# Patient Record
Sex: Female | Born: 1955 | Race: White | Hispanic: Refuse to answer | Marital: Married | Smoking: Former smoker
Health system: Southern US, Community
[De-identification: ages and names within clinical notes are randomized; demographics above are authoritative.]

## PROBLEM LIST (undated history)

## (undated) DIAGNOSIS — C801 Malignant (primary) neoplasm, unspecified: Secondary | ICD-10-CM

## (undated) DIAGNOSIS — I10 Essential (primary) hypertension: Secondary | ICD-10-CM

## (undated) DIAGNOSIS — H8120 Vestibular neuronitis, unspecified ear: Secondary | ICD-10-CM

## (undated) DIAGNOSIS — R42 Dizziness and giddiness: Secondary | ICD-10-CM

## (undated) HISTORY — PX: OTHER SURGICAL HISTORY: SHX169

## (undated) HISTORY — DX: Essential (primary) hypertension: I10

## (undated) HISTORY — DX: Dizziness and giddiness: R42

## (undated) HISTORY — DX: Vestibular neuronitis, unspecified ear: H81.20

## (undated) HISTORY — PX: OOPHORECTOMY: SHX86

## (undated) HISTORY — DX: Malignant (primary) neoplasm, unspecified: C80.1

---

## 1998-03-20 ENCOUNTER — Other Ambulatory Visit: Admission: RE | Admit: 1998-03-20 | Discharge: 1998-03-20 | Payer: Self-pay | Admitting: Obstetrics and Gynecology

## 1999-03-30 ENCOUNTER — Other Ambulatory Visit: Admission: RE | Admit: 1999-03-30 | Discharge: 1999-03-30 | Payer: Self-pay | Admitting: Family Medicine

## 1999-06-10 ENCOUNTER — Encounter: Payer: Self-pay | Admitting: Family Medicine

## 1999-06-10 ENCOUNTER — Encounter: Admission: RE | Admit: 1999-06-10 | Discharge: 1999-06-10 | Payer: Self-pay | Admitting: Family Medicine

## 2000-05-18 ENCOUNTER — Other Ambulatory Visit: Admission: RE | Admit: 2000-05-18 | Discharge: 2000-05-18 | Payer: Self-pay | Admitting: Obstetrics and Gynecology

## 2001-05-31 ENCOUNTER — Other Ambulatory Visit: Admission: RE | Admit: 2001-05-31 | Discharge: 2001-05-31 | Payer: Self-pay | Admitting: Obstetrics and Gynecology

## 2002-03-20 ENCOUNTER — Encounter: Admission: RE | Admit: 2002-03-20 | Discharge: 2002-03-20 | Payer: Self-pay | Admitting: Family Medicine

## 2002-03-20 ENCOUNTER — Encounter: Payer: Self-pay | Admitting: Family Medicine

## 2002-06-20 ENCOUNTER — Other Ambulatory Visit: Admission: RE | Admit: 2002-06-20 | Discharge: 2002-06-20 | Payer: Self-pay | Admitting: Obstetrics and Gynecology

## 2003-05-25 ENCOUNTER — Other Ambulatory Visit: Admission: RE | Admit: 2003-05-25 | Discharge: 2003-05-25 | Payer: Self-pay | Admitting: Obstetrics and Gynecology

## 2004-06-24 ENCOUNTER — Other Ambulatory Visit: Admission: RE | Admit: 2004-06-24 | Discharge: 2004-06-24 | Payer: Self-pay | Admitting: Obstetrics and Gynecology

## 2012-08-15 ENCOUNTER — Encounter: Payer: Self-pay | Admitting: Internal Medicine

## 2012-08-24 ENCOUNTER — Ambulatory Visit (AMBULATORY_SURGERY_CENTER): Payer: BC Managed Care – PPO | Admitting: *Deleted

## 2012-08-24 VITALS — Ht 67.0 in | Wt 182.0 lb

## 2012-08-24 DIAGNOSIS — Z1211 Encounter for screening for malignant neoplasm of colon: Secondary | ICD-10-CM

## 2012-08-24 MED ORDER — MOVIPREP 100 G PO SOLR: ORAL | Status: DC

## 2012-08-24 NOTE — Progress Notes (Signed)
Patient denies any allergies to eggs or soy. 

## 2012-08-27 ENCOUNTER — Encounter: Payer: Self-pay | Admitting: Internal Medicine

## 2012-08-29 ENCOUNTER — Encounter: Payer: Self-pay | Admitting: Internal Medicine

## 2012-08-29 ENCOUNTER — Ambulatory Visit (AMBULATORY_SURGERY_CENTER): Payer: BC Managed Care – PPO | Admitting: Internal Medicine

## 2012-08-29 VITALS — BP 160/92 | HR 64 | Temp 98.6°F | Resp 49 | Ht 67.0 in | Wt 182.0 lb

## 2012-08-29 DIAGNOSIS — Z1211 Encounter for screening for malignant neoplasm of colon: Secondary | ICD-10-CM

## 2012-08-29 DIAGNOSIS — D126 Benign neoplasm of colon, unspecified: Secondary | ICD-10-CM

## 2012-08-29 MED ORDER — SODIUM CHLORIDE 0.9 % IV SOLN: 500.0000 mL | INTRAVENOUS | Status: DC

## 2012-08-29 NOTE — Progress Notes (Signed)
Called to room to assist during endoscopic procedure.  Patient ID and intended procedure confirmed with present staff. Received instructions for my participation in the procedure from the performing physician.  

## 2012-08-29 NOTE — Op Note (Signed)
[facility] [address] [place], [address]   COLONOSCOPY PROCEDURE REPORT  PATIENT: Erika Mitchell, Erika Mitchell  MR#: [id number] BIRTHDATE: 1955/03/17 , 56  yrs. old GENDER: Female ENDOSCOPIST: Beverley Fiedler, MD REFERRED BY:W.  Buren Kos, M.D. PROCEDURE DATE:  08/29/2012 PROCEDURE:   Colonoscopy with cold biopsy polypectomy and Colonoscopy with snare polypectomy ASA CLASS:   Class I INDICATIONS:average risk screening and first colonoscopy. MEDICATIONS: MAC sedation, administered by CRNA and propofol (Diprivan) 400mg  IV  DESCRIPTION OF PROCEDURE:   After the risks benefits and alternatives of the procedure were thoroughly explained, informed consent was obtained.  A digital rectal exam revealed no rectal mass.   The LB [id number]  endoscope was introduced through the anus and advanced to the cecum, which was identified by both the appendix and ileocecal valve. No adverse events experienced. The quality of the prep was good, using MoviPrep  The instrument was then slowly withdrawn as the colon was fully examined.   COLON FINDINGS: A sessile polyp measuring 2 mm in size was found in the ascending colon.  A polypectomy was performed with cold forceps.  The resection was complete and the polyp tissue was completely retrieved.   A sessile polyp measuring 6 mm in size was found in the transverse colon.  A polypectomy was performed with a cold snare.  The resection was complete and the polyp tissue was completely retrieved.   There was mild scattered diverticulosis noted in the sigmoid colon.  Retroflexed views revealed internal hemorrhoids. The time to cecum=5 minutes 00 seconds.  Withdrawal time=15 minutes 00 seconds.  The scope was withdrawn and the procedure completed. COMPLICATIONS: There were no complications.  ENDOSCOPIC IMPRESSION: 1.   Sessile polyp measuring 2 mm in size was found in the ascending colon; polypectomy was performed with cold forceps 2.    Sessile polyp measuring 6 mm in size was found in the transverse colon; polypectomy was performed with a cold snare 3.   There was mild diverticulosis noted in the sigmoid colon  RECOMMENDATIONS: 1.  Hold aspirin, aspirin products, and anti-inflammatory medication for 1 week. 2.  Await pathology results 3.  High fiber diet 4.  If the polyps removed today are proven to be adenomatous (pre-cancerous) polyps, you will need a repeat colonoscopy in 5 years.  Otherwise you should continue to follow colorectal cancer screening guidelines for "routine risk" patients with colonoscopy in 10 years.  You will receive a letter within 1-2 weeks with the results of your biopsy as well as final recommendations.  Please call my office if you have not received a letter after 3 weeks.  eSigned:  Beverley Fiedler, MD 08/29/2012 10:39 AM     cc: The Patient and W.  Buren Kos, MD

## 2012-08-29 NOTE — Patient Instructions (Addendum)
YOU HAD AN ENDOSCOPIC PROCEDURE TODAY AT THE [facility]: Refer to the procedure report that was given to you for any specific questions about what was found during the examination.  If the procedure report does not answer your questions, please call your gastroenterologist to clarify.  If you requested that your care partner not be given the details of your procedure findings, then the procedure report has been included in a sealed envelope for you to review at your convenience later.  YOU SHOULD EXPECT: Some feelings of bloating in the abdomen. Passage of more gas than usual.  Walking can help get rid of the air that was put into your GI tract during the procedure and reduce the bloating. If you had a lower endoscopy (such as a colonoscopy or flexible sigmoidoscopy) you may notice spotting of blood in your stool or on the toilet paper. If you underwent a bowel prep for your procedure, then you may not have a normal bowel movement for a few days.  DIET: Your first meal following the procedure should be a light meal and then it is ok to progress to your normal diet.  A half-sandwich or bowl of soup is an example of a good first meal.  Heavy or fried foods are harder to digest and may make you feel nauseous or bloated.  Likewise meals heavy in dairy and vegetables can cause extra gas to form and this can also increase the bloating.  Drink plenty of fluids but you should avoid alcoholic beverages for 24 hours.  ACTIVITY: Your care partner should take you home directly after the procedure.  You should plan to take it easy, moving slowly for the rest of the day.  You can resume normal activity the day after the procedure however you should NOT DRIVE or use heavy machinery for 24 hours (because of the sedation medicines used during the test).    SYMPTOMS TO REPORT IMMEDIATELY: A gastroenterologist can be reached at any hour.  During normal business hours, 8:30 AM to 5:00 PM Monday through Friday,  call [phone].  After hours and on weekends, please call the GI answering service at [phone] who will take a message and have the physician on call contact you.   Following lower endoscopy (colonoscopy or flexible sigmoidoscopy):  Excessive amounts of blood in the stool  Significant tenderness or worsening of abdominal pains  Swelling of the abdomen that is new, acute  Fever of 100F or higher   FOLLOW UP: If any biopsies were taken you will be contacted by phone or by letter within the next 1-3 weeks.  Call your gastroenterologist if you have not heard about the biopsies in 3 weeks.  Our staff will call the home number listed on your records the next business day following your procedure to check on you and address any questions or concerns that you may have at that time regarding the information given to you following your procedure. This is a courtesy call and so if there is no answer at the home number and we have not heard from you through the emergency physician on call, we will assume that you have returned to your regular daily activities without incident.  SIGNATURES/CONFIDENTIALITY: You and/or your care partner have signed paperwork which will be entered into your electronic medical record.  These signatures attest to the fact that that the information above on your After Visit Summary has been reviewed and is understood.  Full responsibility of the confidentiality of   this discharge information lies with you and/or your care-partner.    Handouts were given on polyps, diverticulosis, high fiber diet and hemorrhoids. Resume your current medications today except for any aspirin or aspirin products or any antiinflammatory medications for 1 week. Please call if any questions or concerns.

## 2012-08-29 NOTE — Progress Notes (Signed)
Lidocaine-40mg IV prior to Propofol InductionPropofol given over incremental dosages 

## 2012-08-29 NOTE — Progress Notes (Signed)
No complaints noted in the recovery room. maw 

## 2012-08-31 ENCOUNTER — Telehealth: Payer: Self-pay

## 2012-08-31 NOTE — Telephone Encounter (Signed)
I dialed [phone] and it rang several times.  No one answered call, it then just stopped ringing.  I could not leave a message. Maw

## 2012-09-03 ENCOUNTER — Other Ambulatory Visit: Payer: Self-pay | Admitting: Neurology

## 2012-09-03 ENCOUNTER — Telehealth: Payer: Self-pay | Admitting: Neurology

## 2012-09-03 ENCOUNTER — Encounter: Payer: Self-pay | Admitting: Internal Medicine

## 2012-09-03 DIAGNOSIS — H8110 Benign paroxysmal vertigo, unspecified ear: Secondary | ICD-10-CM

## 2012-09-03 MED ORDER — ALPRAZOLAM 0.25 MG PO TABS: 0.2500 mg | ORAL_TABLET | Freq: Every day | ORAL | Status: DC

## 2012-09-03 NOTE — Telephone Encounter (Signed)
This is a former [facility] patient assigned to Erika Erika Mitchell.  She is requesting refills on Xanax 0.25mg  taking 4 daily, getting #135 per month.  Erika Erika Mitchell wrote a  6 month Rx for the patient on 03/20/12.  She will need a new Rx for her next fill which is not due until 09/15/2012.  Patient says she takes meds as prescribed, but does take a few extra at times if she is dizzy or has motion sickness from traveling.  Says she will be out of meds by the end of this week and will be going out of town, so she would like to go ahead and get meds filled.  Says she cannot even go to work if she doesn't take this medication.  I expressed the importance of taking meds as prescribed, and not altering dose without first consulting the prescriber.  Advised her we typically do not auth early refills on narcotic medications.  Patient verbalized understanding, but would like a message sent to Erika Erika Mitchell asking that she write an Rx and auth med to be filled early.  Patient was last seen 04/12/2012, and asked to follow up in 1 year with Erika Mitchell per Erika Erika Mitchell.   Would you like to prescribe early?  Please advise.  Thank you.

## 2012-09-05 ENCOUNTER — Encounter: Payer: Self-pay | Admitting: Internal Medicine

## 2012-09-12 ENCOUNTER — Telehealth: Payer: Self-pay | Admitting: Internal Medicine

## 2012-09-12 NOTE — Telephone Encounter (Signed)
Faxed. Could not get cell number to work; left message on home number.

## 2012-09-17 ENCOUNTER — Telehealth: Payer: Self-pay | Admitting: Neurology

## 2012-09-17 NOTE — Telephone Encounter (Signed)
Dr Vickey Huger wrote a small Rx for Xanax (#32) on 07/28.  I called the pharmacy to check and see if she had picked it up, they said she had not.  They have it ready for her.  I called the patient back.  Explained we had sent a small supply to the pharmacy.  It is noted in previous phone message:  No Answer/Busy- Per Message Sent: ----- (Message from Melvyn Novas, MD sent at 09/03/2012 3:51 PM ----- No early refills , This is too much use 4 times daily . I will be happy to facilitate her referral to a psychiatric clinic. ) I called patient, got no answer.   The patient asked if she could be referred to someone else internally rather than an external referral.  Says she is not able to function, cannot drive and is unable to work without Xanax.  States she has been taking this med at this dose since she was 57 years old and nothing else has worked for her vertigo.  She would either like Dr Dohmeier to reconsider and prescribe Xanax as Dr Sandria Manly did or refer her to another provider internally who will do so if possible.   Dr. Vickey Huger, Please advise.  Thank you.

## 2012-09-19 ENCOUNTER — Telehealth: Payer: Self-pay | Admitting: Neurology

## 2012-09-19 DIAGNOSIS — H8110 Benign paroxysmal vertigo, unspecified ear: Secondary | ICD-10-CM

## 2012-09-19 DIAGNOSIS — R42 Dizziness and giddiness: Secondary | ICD-10-CM

## 2012-09-21 ENCOUNTER — Telehealth: Payer: Self-pay | Admitting: Neurology

## 2012-09-21 ENCOUNTER — Other Ambulatory Visit: Payer: Self-pay | Admitting: *Deleted

## 2012-09-21 DIAGNOSIS — H8110 Benign paroxysmal vertigo, unspecified ear: Secondary | ICD-10-CM

## 2012-09-21 DIAGNOSIS — F410 Panic disorder [episodic paroxysmal anxiety] without agoraphobia: Secondary | ICD-10-CM

## 2012-09-21 MED ORDER — ALPRAZOLAM 0.25 MG PO TABS: 0.2500 mg | ORAL_TABLET | Freq: Every day | ORAL | Status: DC

## 2012-09-21 MED ORDER — ALPRAZOLAM 0.25 MG PO TABS: 0.2500 mg | ORAL_TABLET | ORAL | Status: DC

## 2012-09-21 NOTE — Addendum Note (Signed)
Addended by: Melvyn Novas on: 09/21/2012 11:39 AM   Modules accepted: Orders

## 2012-09-21 NOTE — Telephone Encounter (Signed)
Spoke with patient, requesting to know status/decision about referral. Spoke to Dr. Vickey Huger advised patient Xanax will be refilled for another 30 days and referral to psychiatrist will be ordered. Provided patient with psychiatrist info to initiate referral. Patient agreed.

## 2012-09-25 ENCOUNTER — Telehealth: Payer: Self-pay | Admitting: Neurology

## 2012-09-25 NOTE — Telephone Encounter (Signed)
I have been out of the office since 08/12.  I called pharmacy, left message on dr line.

## 2012-10-01 NOTE — Telephone Encounter (Signed)
Called patient and let her know her medication should be in the pharmacy.

## 2012-10-02 ENCOUNTER — Telehealth: Payer: Self-pay | Admitting: *Deleted

## 2012-10-02 NOTE — Telephone Encounter (Signed)
Patient just wanted Dr.dohmeier to know her primary didn't think she needed to see Dr. Nolen Mu and will treat her for her dizziness .

## 2012-12-10 ENCOUNTER — Telehealth: Payer: Self-pay | Admitting: Neurology

## 2012-12-10 NOTE — Telephone Encounter (Signed)
Sent to [place] to sched. New physician

## 2012-12-19 ENCOUNTER — Telehealth: Payer: Self-pay | Admitting: Neurology

## 2012-12-19 NOTE — Telephone Encounter (Signed)
Sent to [place] to [employer]

## 2012-12-28 ENCOUNTER — Telehealth: Payer: Self-pay | Admitting: Neurology

## 2012-12-28 NOTE — Telephone Encounter (Signed)
Former Dr. Sandria Manly pt, reassigned to Dr. Vickey Huger. (for migraines/dizzy).

## 2012-12-28 NOTE — Telephone Encounter (Signed)
Please advise 

## 2013-02-06 ENCOUNTER — Other Ambulatory Visit: Payer: Self-pay | Admitting: Obstetrics and Gynecology

## 2013-05-21 ENCOUNTER — Encounter: Payer: Self-pay | Admitting: *Deleted

## 2013-05-22 ENCOUNTER — Encounter: Payer: Self-pay | Admitting: Neurology

## 2013-05-22 ENCOUNTER — Ambulatory Visit (INDEPENDENT_AMBULATORY_CARE_PROVIDER_SITE_OTHER): Payer: BC Managed Care – PPO | Admitting: Neurology

## 2013-05-22 ENCOUNTER — Encounter (INDEPENDENT_AMBULATORY_CARE_PROVIDER_SITE_OTHER): Payer: Self-pay

## 2013-05-22 VITALS — BP 136/72 | HR 61 | Resp 18 | Ht 67.0 in | Wt 184.0 lb

## 2013-05-22 DIAGNOSIS — H8122 Vestibular neuronitis, left ear: Secondary | ICD-10-CM

## 2013-05-22 DIAGNOSIS — H8110 Benign paroxysmal vertigo, unspecified ear: Secondary | ICD-10-CM

## 2013-05-22 DIAGNOSIS — H8120 Vestibular neuronitis, unspecified ear: Secondary | ICD-10-CM

## 2013-05-22 MED ORDER — ALPRAZOLAM 0.25 MG PO TABS: 0.2500 mg | ORAL_TABLET | Freq: Four times a day (QID) | ORAL | Status: DC | PRN

## 2013-05-22 NOTE — Patient Instructions (Signed)
Vertigo Vertigo means you feel like you or your surroundings are moving when they are not. Vertigo can be dangerous if it occurs when you are at work, driving, or performing difficult activities.  CAUSES  Vertigo occurs when there is a conflict of signals sent to your brain from the visual and sensory systems in your body. There are many different causes of vertigo, including:  Infections, especially in the inner ear.  A bad reaction to a drug or misuse of alcohol and medicines.  Withdrawal from drugs or alcohol.  Rapidly changing positions, such as lying down or rolling over in bed.  A migraine headache.  Decreased blood flow to the brain.  Increased pressure in the brain from a head injury, infection, tumor, or bleeding. SYMPTOMS  You may feel as though the world is spinning around or you are falling to the ground. Because your balance is upset, vertigo can cause nausea and vomiting. You may have involuntary eye movements (nystagmus). DIAGNOSIS  Vertigo is usually diagnosed by physical exam. If the cause of your vertigo is unknown, your caregiver may perform imaging tests, such as an MRI scan (magnetic resonance imaging). TREATMENT  Most cases of vertigo resolve on their own, without treatment. Depending on the cause, your caregiver may prescribe certain medicines. If your vertigo is related to body position issues, your caregiver may recommend movements or procedures to correct the problem. In rare cases, if your vertigo is caused by certain inner ear problems, you may need surgery. HOME CARE INSTRUCTIONS   Follow your caregiver's instructions.  Avoid driving.  Avoid operating heavy machinery.  Avoid performing any tasks that would be dangerous to you or others during a vertigo episode.  Tell your caregiver if you notice that certain medicines seem to be causing your vertigo. Some of the medicines used to treat vertigo episodes can actually make them worse in some people. SEEK  IMMEDIATE MEDICAL CARE IF:   Your medicines do not relieve your vertigo or are making it worse.  You develop problems with talking, walking, weakness, or using your arms, hands, or legs.  You develop severe headaches.  Your nausea or vomiting continues or gets worse.  You develop visual changes.  A family member notices behavioral changes.  Your condition gets worse. MAKE SURE YOU:  Understand these instructions.  Will watch your condition.  Will get help right away if you are not doing well or get worse. Document Released: 11/03/2004 Document Revised: 04/18/2011 Document Reviewed: 08/12/2010 ExitCare Patient Information 2014 [employer].  

## 2013-05-22 NOTE — Progress Notes (Signed)
[facility]  Provider:  Larey Seat, M D  Referring Provider: Marton Redwood, MD Primary Care Physician:  Marton Redwood, MD  Chief Complaint  Patient presents with  . Follow-up    Room 10  . Neurologic Problem    HPI:  Erika Mitchell is a 58 y.o. caucasian , left handed , married  female , she is seen here as a new patient for me, transferred care from Dr Erling Cruz. The patient has last been seen by Dr. Erling Cruz on 04-12-12 with a chief complaint of continued dizziness.  She developed otitis media in 1998 involving pug predominantly the left ear. She was treated with Keflex antibiotic and later placed on Cipro as her symptoms continued.  She noted the onset of dizziness after  the ear infection had been treated.  At first she described a sensation of pressure and aching in both ears , as well as a sense of motion sickness, soon  aggravated by movement.  She developed nausea and indigestion started to feel sick. She had a sensation of being in motion when not moving,  but sitting still , driving a car or standing.  The evaluation in 1998 included a negative study for Lyme disease , TSH,  CBC and an MRI of the brain.  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  She was first seen on 10-09-96 by Dr. 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  She was already taking Xanax at the time and she has remained on the medication ever since , as this does have continued to control her symptoms.  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   Hearing tests followed in 2006 and documented 96% discrimination score in each ear there was no longer tinnitus hearing loss or headache but the dizziness continued to the present date and is worse if the patient doesn't take Xanax.      Her PCP is Dr. Brigitte Pulse .      Review of Systems: Out of a complete 14 system review, the patient complains of only the following symptoms, and all other reviewed systems are negative. dizziness  History   Social History  . Marital Status: Married    Spouse Name: Erika Mitchell    Number of Children: 1  . Years of Education: 14   Occupational History  .     Social History Main Topics  . Smoking status: Former Smoker    Quit date: 02/08/1996  . Smokeless tobacco: Never Used  . Alcohol Use: 1.2 oz/week    2 Glasses of wine per week  . Drug Use: No  . Sexual Activity: Not on file   Other Topics Concern  . Not on file   Social History Narrative   Patient is married Erika Mitchell).     Patient is left-handed.   Patient is working full-time.   Patient has a college education (Associate's)   Patient has one child.   Patient drinks 3 Cups of coffee daily.    Family History  Problem Relation Age of Onset  . Colon cancer Neg Hx   . Lung cancer Mother     Past Medical History  Diagnosis Date  . Vertigo     Past Surgical History  Procedure Laterality Date  .  Oophorectomy  1974&1990    rt then left ovary    Current Outpatient Prescriptions  Medication Sig Dispense Refill  . acetaminophen (TYLENOL) 500 MG tablet Take 1,000 mg by mouth every 6 (six) hours as needed for pain.      Marland Kitchen ALPRAZolam (XANAX) 0.25 MG tablet Take 1 tablet (0.25 mg total) by mouth as directed.  120 tablet  1  . Multiple Vitamin (MULTIVITAMIN) tablet Take 1 tablet by mouth daily.       No current facility-administered medications for this visit.    Allergies as of 05/22/2013 - Review Complete 05/22/2013  Allergen Reaction Noted  . Epinephrine Other (See Comments) 08/24/2012    Vitals: BP 136/72  Pulse 61  Resp 18  Ht 5\' 7"  (1.702 m)  Wt 184 lb (83.462 kg)  BMI 28.81 kg/m2 Last Weight:  Wt Readings from Last 1 Encounters:  05/22/13 184 lb (83.462 kg)   Last Height:   Ht Readings from Last 1  Encounters:  05/22/13 5\' 7"  (1.702 m)    Physical exam:  General: The patient is awake, alert and appears not in acute distress. The patient is well groomed. Head: Normocephalic, atraumatic. Neck is supple. Mallampati 1 neck circumference: 15. No TMJ  Cardiovascular:  Regular rate and rhythm, without  murmurs or carotid bruit, and without distended neck veins. Respiratory: Lungs are clear to auscultation. Skin:  Without evidence of edema, or rash Trunk: BMI is  elevated and patient  has normal posture.  Neurologic exam : The patient is awake and alert, oriented to place and time.  Memory subjective  described as intact.  There is a normal attention span & concentration ability. Speech is fluent without dysarthria, dysphonia or aphasia. Mood and affect are appropriate.  Cranial nerves: Pupils are equal and briskly reactive to light. Funduscopic exam without evidence of pallor or edema.  Extraocular movements  in vertical and horizontal planes intact and without nystagmus. Visual fields by finger perimetry are intact. Hearing to finger rub intact.  Facial sensation intact to fine touch. Facial motor strength is symmetric and tongue and uvula move midline.  Motor exam:   Normal tone and normal muscle bulk and symmetric normal strength in all extremities.  Sensory:  Fine touch, pinprick and vibration were tested in all extremities. Proprioception is tested in the upper extremities only. This was normal.  Coordination: Rapid alternating movements in the fingers/hands is tested and normal. Finger-to-nose maneuver tested and normal without evidence of ataxia, dysmetria or tremor.  Gait and station: Patient walks without assistive device , able to  climb up to the exam table. Strength within normal limits. Stance is stable and normal.  Deep tendon reflexes: in the  upper and lower extremities are symmetric and intact. Babinski maneuver response is downgoing.   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 patient has questions about flying , concern about the pressure changes in a cabin.   She was advised to premedicate .   Plan:  Treatment plan and additional workup : Xanax 0.25   1/2 tab 4-5 times a day.

## 2013-07-19 ENCOUNTER — Other Ambulatory Visit: Payer: Self-pay | Admitting: Neurology

## 2013-07-19 DIAGNOSIS — H8122 Vestibular neuronitis, left ear: Secondary | ICD-10-CM

## 2013-07-19 DIAGNOSIS — H8110 Benign paroxysmal vertigo, unspecified ear: Secondary | ICD-10-CM

## 2013-07-19 MED ORDER — ALPRAZOLAM 0.25 MG PO TABS: 0.2500 mg | ORAL_TABLET | Freq: Four times a day (QID) | ORAL | Status: DC | PRN

## 2013-07-19 NOTE — Telephone Encounter (Signed)
Pt's Rx was faxed to the pt's pharmacy.

## 2013-07-23 ENCOUNTER — Telehealth: Payer: Self-pay | Admitting: Neurology

## 2013-07-23 DIAGNOSIS — H8122 Vestibular neuronitis, left ear: Secondary | ICD-10-CM

## 2013-07-23 DIAGNOSIS — H8110 Benign paroxysmal vertigo, unspecified ear: Secondary | ICD-10-CM

## 2013-07-23 MED ORDER — ALPRAZOLAM 0.25 MG PO TABS: 0.2500 mg | ORAL_TABLET | Freq: Four times a day (QID) | ORAL | Status: DC | PRN

## 2013-07-23 NOTE — Telephone Encounter (Signed)
Pt called states her prescription ALPRAZolam (XANAX) 0.25 MG tablet  is not correct, states she only got refill of 120 take 1 tablet (4) times a day when it is supposed to be refill of 135 (1.5) tablet 4 times a day. Please call pt concerning this matter. Thanks

## 2013-07-23 NOTE — Telephone Encounter (Signed)
She has 4 1/2 tabs per day now . 135 per month. VERTGO

## 2013-07-23 NOTE — Telephone Encounter (Signed)
Last OV note says: Plan: Treatment plan and additional workup : Xanax 0.25 1/2 tab 4-5 times a day.   I called the patient back She said she has been taking 4 1/2 tablets total daily and would like the Rx changed to reflect this.  She wants a quantity of #135.  Says Dr Erling Cruz had prescribed it this way for her when she was under his care.  Indicates she does not take this med for anxiety, but for Vertigo.  Please advise if it is okay to change Rx.  Thank you.

## 2013-08-06 ENCOUNTER — Other Ambulatory Visit: Payer: Self-pay

## 2013-08-06 DIAGNOSIS — H8122 Vestibular neuronitis, left ear: Secondary | ICD-10-CM

## 2013-08-06 MED ORDER — ALPRAZOLAM 0.25 MG PO TABS: ORAL_TABLET | ORAL | Status: DC

## 2013-08-06 NOTE — Telephone Encounter (Signed)
Rx signed and faxed.

## 2013-08-21 ENCOUNTER — Other Ambulatory Visit: Payer: Self-pay | Admitting: Dermatology

## 2013-12-10 ENCOUNTER — Other Ambulatory Visit: Payer: Self-pay

## 2013-12-10 DIAGNOSIS — H8122 Vestibular neuronitis, left ear: Secondary | ICD-10-CM

## 2013-12-11 MED ORDER — ALPRAZOLAM 0.25 MG PO TABS: ORAL_TABLET | ORAL | Status: DC

## 2014-04-15 ENCOUNTER — Telehealth: Payer: Self-pay | Admitting: *Deleted

## 2014-04-30 ENCOUNTER — Other Ambulatory Visit: Payer: Self-pay | Admitting: Obstetrics and Gynecology

## 2014-05-01 LAB — CYTOLOGY - PAP

## 2014-05-06 NOTE — Telephone Encounter (Signed)
Appt rescheduled to 06-18-14.

## 2014-05-21 ENCOUNTER — Ambulatory Visit: Payer: BC Managed Care – PPO | Admitting: Neurology

## 2014-06-09 ENCOUNTER — Other Ambulatory Visit: Payer: Self-pay

## 2014-06-09 DIAGNOSIS — H8122 Vestibular neuronitis, left ear: Secondary | ICD-10-CM

## 2014-06-09 MED ORDER — ALPRAZOLAM 0.25 MG PO TABS: ORAL_TABLET | ORAL | Status: DC

## 2014-06-10 NOTE — Telephone Encounter (Signed)
Rx has been faxed.

## 2014-06-18 ENCOUNTER — Encounter: Payer: Self-pay | Admitting: Neurology

## 2014-06-18 ENCOUNTER — Ambulatory Visit (INDEPENDENT_AMBULATORY_CARE_PROVIDER_SITE_OTHER): Payer: BLUE CROSS/BLUE SHIELD | Admitting: Neurology

## 2014-06-18 VITALS — BP 138/77 | HR 66 | Resp 18 | Ht 66.93 in | Wt 190.0 lb

## 2014-06-18 DIAGNOSIS — H8122 Vestibular neuronitis, left ear: Secondary | ICD-10-CM | POA: Diagnosis not present

## 2014-06-18 DIAGNOSIS — R42 Dizziness and giddiness: Secondary | ICD-10-CM | POA: Diagnosis not present

## 2014-06-18 MED ORDER — ALPRAZOLAM 0.25 MG PO TABS: ORAL_TABLET | ORAL | Status: DC

## 2014-06-18 NOTE — Progress Notes (Signed)
[facility]  Provider:  Larey Seat, M D  Referring Provider: Marton Redwood, MD Primary Care Physician:  Marton Redwood, MD  Chief Complaint  Patient presents with  . Dizziness    f/u, rm 11, alone    HPI:  Erika Mitchell is a 59 y.o. caucasian, left handed, married female,  she is seen here as a new patient for me, transferred care from Dr Erling Cruz.    The patient has  been seen by Dr. Erling Cruz ( last on 04-12-12) with a chief complaint of continued dizziness.  She developed otitis media in 1998 involving pug predominantly the left ear. She was treated with Keflex antibiotic and later placed on Cipro as her symptoms continued. She noted the onset of dizziness after  the ear infection had been treated. At first she described a sensation of pressure and aching in both ears , as well as a sense of motion sickness, soon  aggravated by movement.  She developed nausea and indigestion started to feel sick. She had a sensation of being in motion when not moving, but sitting still , driving a car or standing. The evaluation in 1998 included a negative study for Lyme disease , TSH,  CBC and an MRI of the brain.  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  She was first seen on 10-09-96 by Dr. 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  She was already taking Xanax at the time and she has remained on the medication ever since , as this does have continued to control her symptoms.  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  Hearing tests followed in 2006 and documented 96% discrimination score in each ear-  there was no longer tinnitus,  hearing loss or headache - but the dizziness continued to the present date, worse if the patient doesn't take Xanax.  Her PCP is Dr.  Brigitte Pulse.  We are meeting on 06-18-14 for a refill. Erika Mitchell race Main condition has not changed, the medication that controls the dizziness still works. She has not expected any early refills, she has not up her dose. She has been 4 years stable at the same medication regimen.    Review of Systems: Out of a complete 14 system review, the patient complains of only the following symptoms, and all other reviewed systems are negative. dizziness  History   Social History  . Marital Status: Married    Spouse Name: Erika Mitchell  . Number of Children: 1  . Years of Education: 14   Occupational History  .     Social History Main Topics  . Smoking status: Former Smoker    Quit date: 02/08/1996  . Smokeless tobacco: Never Used  . Alcohol Use: 1.2 oz/week    2 Glasses of wine per week  . Drug Use: No  . Sexual Activity: Not on file   Other Topics Concern  . Not on file   Social History Narrative   Patient is married Erika Mitchell).     Patient is left-handed.   Patient is working full-time.   Patient has a college education (Associate's)   Patient has one child.   Patient drinks 3 Cups of coffee daily.    Family History  Problem Relation Age of  Onset  . Colon cancer Neg Hx   . Lung cancer Mother     Past Medical History  Diagnosis Date  . Vertigo   . Hypertension     Past Surgical History  Procedure Laterality Date  . Oophorectomy  1974&1990    rt then left ovary    Current Outpatient Prescriptions  Medication Sig Dispense Refill  . acetaminophen (TYLENOL) 500 MG tablet Take 1,000 mg by mouth every 6 (six) hours as needed for pain.    Marland Kitchen ALPRAZolam (XANAX) 0.25 MG tablet Take four and one half tabs daily as directed 135 tablet 0  . Multiple Vitamin (MULTIVITAMIN) tablet Take 1 tablet by mouth daily.     No current facility-administered medications for this visit.    Allergies as of 06/18/2014 - Review Complete 06/18/2014  Allergen Reaction Noted  . Azithromycin  06/18/2014   . Epinephrine Other (See Comments) 08/24/2012  . Motrin [ibuprofen]  06/18/2014    Vitals: BP 138/77 mmHg  Pulse 66  Resp 18  Ht 5' 6.93" (1.7 m)  Wt 190 lb (86.183 kg)  BMI 29.82 kg/m2 Last Weight:  Wt Readings from Last 1 Encounters:  06/18/14 190 lb (86.183 kg)   Last Height:   Ht Readings from Last 1 Encounters:  06/18/14 5' 6.93" (1.7 m)    Physical exam:  General: The patient is awake, alert and appears not in acute distress. The patient is well groomed. Head: Normocephalic, atraumatic.  Neck is supple. Mallampati 1 neck circumference: 15. No TMJ , no dysphagia- dysphonia.  Cardiovascular:  Regular rate and rhythm, without murmurs or carotid bruit, and without distended neck veins. Respiratory: Lungs are clear to auscultation. Skin:  Without evidence of edema, or rash  Neurologic exam : The patient is awake and alert, oriented to place and time.   Memory subjective  described as intact.  There is a normal attention span & concentration ability. Speech is fluent without dysarthria, dysphonia or aphasia. Mood and affect are appropriate. She does not appear anxious or depressed, the patient doesn't have panic attacks. She is driving she is gainfully employed, she maintains her daily rituals and all activities of daily living.  Cranial nerves: Pupils are equal and briskly reactive to light.  Extraocular movements  in vertical and horizontal planes intact and without nystagmus. Visual fields by finger perimetry are intact. Hearing to finger rub intact.  Facial motor strength is symmetric and tongue and uvula move midline. Motor exam:   Normal tone and muscle bulk and symmetric strength in all extremities. Sensory:  Fine touch, pinprick and vibration were normal. Coordination: Rapid alternating movements in the fingers/hands- Finger-to-nose maneuver tested and normal without evidence of ataxia, dysmetria or tremor. Gait and station: Patient walks without assistive device,  able to climb up to the exam table. Strength within normal limits. Stance is stable and normal.  Deep tendon reflexes: in the  upper and lower extremities are symmetric and intact. Babinski maneuver response is downgoing.   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  2) Xanax supressed vertigo and anxiety. She can drive and function. Her dizziness is less severe.    Plan:  Treatment plan and additional workup : Xanax 0.25  ;  1/2 tab 4-5 times a day.  30 days , 5 refills.  Rv every 6 month

## 2014-12-22 ENCOUNTER — Ambulatory Visit (INDEPENDENT_AMBULATORY_CARE_PROVIDER_SITE_OTHER): Payer: BLUE CROSS/BLUE SHIELD | Admitting: Neurology

## 2014-12-22 ENCOUNTER — Encounter: Payer: Self-pay | Admitting: Neurology

## 2014-12-22 VITALS — BP 136/84 | HR 76 | Resp 20 | Ht 67.0 in | Wt 194.0 lb

## 2014-12-22 DIAGNOSIS — R42 Dizziness and giddiness: Secondary | ICD-10-CM | POA: Diagnosis not present

## 2014-12-22 DIAGNOSIS — H8122 Vestibular neuronitis, left ear: Secondary | ICD-10-CM | POA: Diagnosis not present

## 2014-12-22 MED ORDER — ALPRAZOLAM 0.25 MG PO TABS: ORAL_TABLET | ORAL | Status: DC

## 2014-12-22 NOTE — Patient Instructions (Signed)

## 2014-12-22 NOTE — Progress Notes (Signed)
[facility]  Provider:  Larey Seat, M D  Referring Provider: Marton Redwood, MD Primary Care Physician:  Erika Redwood, MD  Chief Complaint  Patient presents with  . Follow-up    dizziness about the same, xanax helps a little bit, rm 10, alone  . Dizziness    HPI:  Erika Mitchell is a 59 y.o. caucasian, left handed, married female,  she is seen here as a new patient for me, transferred care from Dr Erika Mitchell. The patient had been seen by Dr. Erling Mitchell ( last on 04-12-12) with a chief complaint of continued dizziness. She developed otitis media in 1998 involving pug predominantly the left ear. She was treated with Keflex antibiotic and later placed on Cipro as her symptoms continued. She noted the onset of dizziness after  the ear infection had been treated. At first she described a sensation of pressure and aching in both ears , as well as a sense of motion sickness, soon  aggravated by movement. She developed nausea and indigestion started to feel sick. She had a sensation of being in motion when not moving, but sitting still , driving a car or standing. The evaluation in 1998 included a negative study for Lyme disease, TSH, CBC and an MRI of the brain.  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  She was first seen on 10-09-96 by Dr. 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  She was already taking Xanax at the time and she has remained on the medication ever since , as this does have continued to control her symptoms.  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  Hearing tests followed in 2006 and documented 96% discrimination score in each ear-  there was no longer tinnitus,  hearing loss or headache - but the dizziness continued to the present date, worse if the patient  doesn't take Xanax.  Her PCP is Dr. Brigitte Mitchell. We are meeting on 06-18-14 for a refill. Erika Mitchell  condition has not changed, the medication that controls the dizziness still works. She has not expected any early refills, she has not up her dose. She has been 4 years stable at the same medication regimen.  Interval history from 12-22-14. Erika Mitchell is here today for a routine revisit. I have seen her last on 06-18-14. We are refilling her medication today. The patient describes to triggers for her vertigo one is hayfever or allergies seasonal. These trigger vertigo. Second one is if she forgets her Xanax. She will usually develop a vertigo with an 3 or 4 hours after the dose was supposed to be taken. Vertigo can also be triggered by rapid motion or by a moving surface. She avoids stepping on board.   Review of Systems: Out of a complete 14 system review, the patient complains of only the following symptoms, and all other reviewed systems are negative. Dizziness, anxiety. Vestibulitis.   Social History   Social History  . Marital Status: Married    Spouse Name: Erika Mitchell  . Number of Children: 1  . Years of Education: 14   Occupational History  .     Social History Main Topics  . Smoking status: Former Smoker    Quit date: 02/08/1996  . Smokeless tobacco: Never Used  . Alcohol  Use: 1.2 oz/week    2 Glasses of wine per week  . Drug Use: No  . Sexual Activity: Not on file   Other Topics Concern  . Not on file   Social History Narrative   Patient is married Erika Mitchell).     Patient is left-handed.   Patient is working full-time.   Patient has a college education (Associate's)   Patient has one child.   Patient drinks 3 Cups of coffee daily.    Family History  Problem Relation Age of Onset  . Colon cancer Neg Hx   . Lung cancer Mother     Past Medical History  Diagnosis Date  . Vertigo   . Hypertension     Past Surgical History  Procedure Laterality Date  . Oophorectomy   1974&1990    rt then left ovary    Current Outpatient Prescriptions  Medication Sig Dispense Refill  . acetaminophen (TYLENOL) 500 MG tablet Take 1,000 mg by mouth every 6 (six) hours as needed for pain.    Marland Kitchen ALPRAZolam (XANAX) 0.25 MG tablet Take four and one half tabs daily as directed 135 tablet 5  . Multiple Vitamin (MULTIVITAMIN) tablet Take 1 tablet by mouth daily.     No current facility-administered medications for this visit.    Allergies as of 12/22/2014 - Review Complete 12/22/2014  Allergen Reaction Noted  . Azithromycin  06/18/2014  . Epinephrine Other (See Comments) 08/24/2012  . Motrin [ibuprofen]  06/18/2014    Vitals: BP 136/84 mmHg  Mitchell 78  Resp 20  Ht 5\' 7"  (1.702 m)  Wt 194 lb (87.998 kg)  BMI 30.38 kg/m2 Last Weight:  Wt Readings from Last 1 Encounters:  12/22/14 194 lb (87.998 kg)   Last Height:   Ht Readings from Last 1 Encounters:  12/22/14 5\' 7"  (1.702 m)    Physical exam:  General: The patient is awake, alert and appears not in acute distress. The patient is well groomed. Head: Normocephalic, atraumatic.  Neck is supple. Mallampati 1,  neck circumference: 15. 5 " , and uvula are midline no fasciculations no irritation and  no tongue bite. No evidence of bruxism.    No TMJ , no dysphagia- dysphonia.  Cardiovascular:  Regular rate and rhythm, without murmurs or carotid bruit, and without distended neck veins. Respiratory: Lungs are clear to auscultation. No rhonchi.  Skin:  Without evidence of edema, or rash  Neurologic exam :The patient is awake and alert, oriented to place and time. Memory subjective  described as intact. There is a normal attention span & concentration ability.Speech is fluent without dysarthria, dysphonia or aphasia. Mood and affect are appropriate. She does not appear anxious or depressed, the patient doesn't have panic attacks. She is driving . She is gainfully employed, she maintains her daily rituals and all  activities of daily living.  Cranial nerves: Pupils are equal and briskly reactive to light.  Extraocular movements  in vertical and horizontal planes intact and without nystagmus. Visual fields by finger perimetry are intact. Hearing to finger rub intact. Air and bone conduction appears symmetric, Rinne and Weber test negative.  Facial motor strength is symmetric and tongue and uvula move midline. Shoulder shrug symmetric.  Motor exam:   Normal tone and muscle bulk and symmetric strength in all extremities. Sensory:  Fine touch, pinprick and vibration were normal. Coordination: Rapid alternating movements in the fingers/hands- Finger-to-nose maneuver tested and normal without evidence of ataxia, dysmetria or tremor. Gait and station: Patient walks without  assistive device, able to climb up to the exam table. Strength within normal limits. Stance is stable and normal.  Deep tendon reflexes: in the  upper and lower extremities are symmetric and intact. Babinski maneuver response is downgoing.   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 Vestibulitis of this kind is not treatable by vestibular rehab.  2) Xanax supressed vertigo and anxiety. She can drive and function. Her dizziness is less severe.  3) Rv with 15 minutes.    Plan:  Treatment plan and additional workup :  Xanax 0.25  ; refilled . 1/2 tab 4-5 times a day.  30 days , 5 refills.  Rv every 6 month, alternating between NP and me.

## 2015-02-08 HISTORY — PX: CHOLECYSTECTOMY: SHX55

## 2015-07-13 ENCOUNTER — Telehealth: Payer: Self-pay | Admitting: Neurology

## 2015-07-13 DIAGNOSIS — H8122 Vestibular neuronitis, left ear: Secondary | ICD-10-CM

## 2015-07-13 DIAGNOSIS — R42 Dizziness and giddiness: Secondary | ICD-10-CM

## 2015-07-13 MED ORDER — ALPRAZOLAM 0.25 MG PO TABS: ORAL_TABLET | ORAL | Status: DC

## 2015-07-13 NOTE — Telephone Encounter (Signed)
Pt called sts she needs refill for ALPRAZolam (XANAX) 0.25 MG tablet . 6 mth refills please  She sts [facility] said it has been faxed 3 times. Pharmacy: [facility]

## 2015-07-13 NOTE — Telephone Encounter (Signed)
RX for xanax faxed to [facility]. Received a receipt of confirmation. I have only received one refill request from [facility], and I received if after pt had called.  I called pt to advise her of this. No answer, left a message asking her to call me back. If pt calls back, please advise her of this information.

## 2015-07-14 NOTE — Telephone Encounter (Signed)
Patient returned Kristen's call, relayed message below.

## 2015-08-20 ENCOUNTER — Other Ambulatory Visit: Payer: Self-pay | Admitting: Obstetrics and Gynecology

## 2015-08-20 DIAGNOSIS — Z124 Encounter for screening for malignant neoplasm of cervix: Secondary | ICD-10-CM | POA: Diagnosis not present

## 2015-08-20 DIAGNOSIS — Z6830 Body mass index (BMI) 30.0-30.9, adult: Secondary | ICD-10-CM | POA: Diagnosis not present

## 2015-08-20 DIAGNOSIS — Z01419 Encounter for gynecological examination (general) (routine) without abnormal findings: Secondary | ICD-10-CM | POA: Diagnosis not present

## 2015-08-21 LAB — CYTOLOGY - PAP

## 2015-08-31 DIAGNOSIS — Z1231 Encounter for screening mammogram for malignant neoplasm of breast: Secondary | ICD-10-CM | POA: Diagnosis not present

## 2015-09-01 DIAGNOSIS — Z0000 Encounter for general adult medical examination without abnormal findings: Secondary | ICD-10-CM | POA: Diagnosis not present

## 2015-09-01 DIAGNOSIS — I10 Essential (primary) hypertension: Secondary | ICD-10-CM | POA: Diagnosis not present

## 2015-09-08 DIAGNOSIS — E669 Obesity, unspecified: Secondary | ICD-10-CM | POA: Diagnosis not present

## 2015-09-08 DIAGNOSIS — I10 Essential (primary) hypertension: Secondary | ICD-10-CM | POA: Diagnosis not present

## 2015-09-08 DIAGNOSIS — H8120 Vestibular neuronitis, unspecified ear: Secondary | ICD-10-CM | POA: Diagnosis not present

## 2015-09-08 DIAGNOSIS — Z1389 Encounter for screening for other disorder: Secondary | ICD-10-CM | POA: Diagnosis not present

## 2015-09-08 DIAGNOSIS — R42 Dizziness and giddiness: Secondary | ICD-10-CM | POA: Diagnosis not present

## 2015-09-08 DIAGNOSIS — Z0000 Encounter for general adult medical examination without abnormal findings: Secondary | ICD-10-CM | POA: Diagnosis not present

## 2015-09-14 DIAGNOSIS — Z1212 Encounter for screening for malignant neoplasm of rectum: Secondary | ICD-10-CM | POA: Diagnosis not present

## 2015-10-20 DIAGNOSIS — Z6831 Body mass index (BMI) 31.0-31.9, adult: Secondary | ICD-10-CM | POA: Diagnosis not present

## 2015-10-20 DIAGNOSIS — I10 Essential (primary) hypertension: Secondary | ICD-10-CM | POA: Diagnosis not present

## 2015-11-24 DIAGNOSIS — R8299 Other abnormal findings in urine: Secondary | ICD-10-CM | POA: Diagnosis not present

## 2015-11-24 DIAGNOSIS — N390 Urinary tract infection, site not specified: Secondary | ICD-10-CM | POA: Diagnosis not present

## 2015-11-24 DIAGNOSIS — R300 Dysuria: Secondary | ICD-10-CM | POA: Diagnosis not present

## 2015-12-11 DIAGNOSIS — R1011 Right upper quadrant pain: Secondary | ICD-10-CM | POA: Diagnosis not present

## 2015-12-11 DIAGNOSIS — K30 Functional dyspepsia: Secondary | ICD-10-CM | POA: Diagnosis not present

## 2015-12-11 DIAGNOSIS — I10 Essential (primary) hypertension: Secondary | ICD-10-CM | POA: Diagnosis not present

## 2015-12-11 DIAGNOSIS — K8020 Calculus of gallbladder without cholecystitis without obstruction: Secondary | ICD-10-CM | POA: Diagnosis not present

## 2015-12-11 DIAGNOSIS — B029 Zoster without complications: Secondary | ICD-10-CM | POA: Diagnosis not present

## 2015-12-23 ENCOUNTER — Ambulatory Visit (INDEPENDENT_AMBULATORY_CARE_PROVIDER_SITE_OTHER): Payer: BLUE CROSS/BLUE SHIELD | Admitting: Neurology

## 2015-12-23 ENCOUNTER — Encounter: Payer: Self-pay | Admitting: Neurology

## 2015-12-23 ENCOUNTER — Other Ambulatory Visit: Payer: Self-pay | Admitting: Surgery

## 2015-12-23 DIAGNOSIS — R42 Dizziness and giddiness: Secondary | ICD-10-CM

## 2015-12-23 DIAGNOSIS — K8020 Calculus of gallbladder without cholecystitis without obstruction: Secondary | ICD-10-CM | POA: Diagnosis not present

## 2015-12-23 DIAGNOSIS — H8122 Vestibular neuronitis, left ear: Secondary | ICD-10-CM

## 2015-12-23 MED ORDER — ALPRAZOLAM 0.25 MG PO TABS: ORAL_TABLET | ORAL | 5 refills | Status: DC

## 2015-12-23 NOTE — Progress Notes (Signed)
[facility]  Provider:  Larey Seat, M D  Referring Provider: Marton Redwood, MD Primary Care Physician:  Marton Redwood, MD  Chief Complaint  Patient presents with  . Follow-up    vertigo about the same, needs to have gallbladder out    HPI:  Erika Mitchell is a 60 y.o. caucasian, left handed, married female,   She has transferred care from Dr Erling Cruz. The patient had been seen by Dr. Erling Cruz ( last on 04-12-12) with a chief complaint of continued dizziness. She developed otitis media in 1998 involving pug predominantly the left ear. She was treated with Keflex antibiotic and later placed on Cipro as her symptoms continued. She noted the onset of dizziness after  the ear infection had been treated. At first she described a sensation of pressure and aching in both ears , as well as a sense of motion sickness, soon  aggravated by movement. She developed nausea and indigestion started to feel sick. She had a sensation of being in motion when not moving, but sitting still , driving a car or standing. The evaluation in 1998 included a negative study for Lyme disease, TSH, CBC and an MRI of the brain.  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 She was first seen on 10-09-96 by Dr. 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 She was already taking Xanax at the time and she has remained on the medication ever since , as this does have continued to control her symptoms.  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  Hearing tests followed in 2006 and documented 96% discrimination score in each ear-  there was no longer tinnitus,  hearing loss or headache - but the dizziness continued to the present date, worse if the patient doesn't take Xanax.  Her PCP is Dr. Brigitte Pulse.  Interval history  from 12-22-14. Erika Mitchell is here today for a routine revisit. I have seen her last on 06-18-14. We are refilling her medication today. The patient describes to triggers for her vertigo one is hayfever or allergies seasonal. These trigger vertigo. Second one is if she forgets her Xanax. She will usually develop a vertigo with an 3 or 4 hours after the dose was supposed to be taken.Vertigo can also be triggered by rapid motion or by a moving surface. She avoids stepping on board of a ship or boat, having extreme dizziness in those situations.   Interval history from 12/23/2015. 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 I have continued treating her with Xanax as initiated originally by Dr. Erling Cruz almost 20 years ago. Continues to be gainfully employed and functions as well as can be. She is preparing for gallbladder surgery and was wondering if the anesthesia may affect her vestibulitis. In fact, it can. She may experience a prolonged period of swimmy headedness lightheadedness and I would recommend that she should stay overnight and not be discharged ambulatory.  Review of Systems: Out of a complete 14 system review, the patient complains of only the following symptoms, and all other reviewed systems are negative. Dizziness, anxiety. Vestibulitis.   Social History   Social History  . Marital status: Married    Spouse name: [name]  . Number of children:  1  . Years of education: 40   Occupational History  .  [employer]   Social History Main Topics  . Smoking status: Former Smoker    Quit date: 02/08/1996  . Smokeless tobacco: Never Used  . Alcohol use 1.2 oz/week    2 Glasses of wine per week  . Drug use: No  . Sexual activity: Not on file   Other Topics Concern  . Not on file   Social History Narrative   Patient is married [name]).     Patient is left-handed.   Patient is working full-time.   Patient has a  college education (Associate's)   Patient has one child.   Patient drinks 3 Cups of coffee daily.    Family History  Problem Relation Age of Onset  . Colon cancer Neg Hx   . Lung cancer Mother     Past Medical History:  Diagnosis Date  . Hypertension   . Vertigo     Past Surgical History:  Procedure Laterality Date  . OOPHORECTOMY  [id number]   rt then left ovary    Current Outpatient Prescriptions  Medication Sig Dispense Refill  . acetaminophen (TYLENOL) 500 MG tablet Take 1,000 mg by mouth every 6 (six) hours as needed for pain.    Marland Kitchen ALPRAZolam (XANAX) 0.25 MG tablet Take four and one half tabs daily as directed 135 tablet 5  . Multiple Vitamin (MULTIVITAMIN) tablet Take 1 tablet by mouth daily.    . pantoprazole (PROTONIX) 20 MG tablet Take 20 mg by mouth daily.    . valsartan (DIOVAN) 40 MG tablet Take 40 mg by mouth daily.     No current facility-administered medications for this visit.     Allergies as of 12/23/2015 - Review Complete 12/23/2015  Allergen Reaction Noted  . Azithromycin  06/18/2014  . Epinephrine Other (See Comments) 08/24/2012  . Motrin [ibuprofen]  06/18/2014    Vitals: BP 114/70   Pulse 71   Resp 20   Ht 5\' 7"  (1.702 m)   Wt 187 lb (84.8 kg)   BMI 29.29 kg/m  Last Weight:  Wt Readings from Last 1 Encounters:  12/23/15 187 lb (84.8 kg)   Last Height:   Ht Readings from Last 1 Encounters:  12/23/15 5\' 7"  (1.702 m)    Physical exam:  General: The patient is awake, alert and appears not in acute distress. The patient is well groomed. Head: Normocephalic, atraumatic.  Neck is supple. Mallampati 1,  neck circumference: 15. 5 " , and uvula are midline no fasciculations no irritation and  no tongue bite. No evidence of bruxism.    No TMJ , no dysphagia- dysphonia.  Cardiovascular:  Regular rate and rhythm, without murmurs or carotid bruit, and without distended neck veins. Respiratory: Lungs are clear to auscultation. No rhonchi.   Skin:  Without evidence of edema, or rash  Neurologic exam :The patient is awake and alert, oriented to place and time.  Memory subjective  described as intact. There is a normal attention span & concentration ability. Speech is fluent without dysarthria, dysphonia or aphasia.  Mood and affect are appropriate. She does not appear anxious or depressed, the patient doesn't have panic attacks. She is driving . She is gainfully employed, she maintains her daily rituals and all activities of daily living.  Cranial nerves: Normal taste and smell, Pupils are equal and briskly reactive to light.  Extraocular movements  Intact- Visual fields by finger perimetry are intact. Hearing to finger rub  intact. Air and bone conduction appears symmetric, Rinne and Weber test negative.Facial motor strength is symmetric and tongue and uvula move midline. Shoulder shrug symmetric.  Motor exam:   Normal tone and muscle bulk and symmetric strength in all extremities. Sensory:  Fine touch, pinprick and vibration were normal. Coordination: Rapid alternating movements in the fingers/hands- Finger-to-nose maneuver tested and normal without evidence of ataxia, dysmetria or tremor. Gait and station: Patient walks without assistive device, able to climb up to the exam table. Strength within normal limits. Stance is stable and normal.  Deep tendon reflexes: in the  upper and lower extremities are symmetric and intact. Babinski maneuver response is downgoing.   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 Vestibulitis of this kind is not treatable by vestibular rehab.  2) Xanax supressed vertigo and anxiety. She can drive and function. Her dizziness is less severe.  3) Rv 30 minutes.    Plan:  Treatment plan and additional workup : Please  follow up with Np in next visit.   Xanax 0.25 ; refilled. 1/2 tab 4-5 times a day. 30 days , 5 refills.   Rv every 6 month, alternating between NP and me.    Shainna Faux, MD    cc Dr. Brigitte Pulse, MD

## 2016-01-11 ENCOUNTER — Other Ambulatory Visit: Payer: Self-pay | Admitting: Surgery

## 2016-01-11 DIAGNOSIS — K824 Cholesterolosis of gallbladder: Secondary | ICD-10-CM | POA: Diagnosis not present

## 2016-01-11 DIAGNOSIS — K8020 Calculus of gallbladder without cholecystitis without obstruction: Secondary | ICD-10-CM | POA: Diagnosis not present

## 2016-01-11 DIAGNOSIS — K8010 Calculus of gallbladder with chronic cholecystitis without obstruction: Secondary | ICD-10-CM | POA: Diagnosis not present

## 2016-06-13 DIAGNOSIS — H524 Presbyopia: Secondary | ICD-10-CM | POA: Diagnosis not present

## 2016-06-27 DIAGNOSIS — H43813 Vitreous degeneration, bilateral: Secondary | ICD-10-CM | POA: Diagnosis not present

## 2016-06-27 DIAGNOSIS — D3132 Benign neoplasm of left choroid: Secondary | ICD-10-CM | POA: Diagnosis not present

## 2016-06-27 DIAGNOSIS — H43393 Other vitreous opacities, bilateral: Secondary | ICD-10-CM | POA: Diagnosis not present

## 2016-07-07 ENCOUNTER — Other Ambulatory Visit: Payer: Self-pay | Admitting: Neurology

## 2016-07-07 DIAGNOSIS — H8122 Vestibular neuronitis, left ear: Secondary | ICD-10-CM

## 2016-07-07 DIAGNOSIS — R42 Dizziness and giddiness: Secondary | ICD-10-CM

## 2016-07-07 MED ORDER — ALPRAZOLAM 0.25 MG PO TABS: ORAL_TABLET | ORAL | 5 refills | Status: DC

## 2016-07-07 NOTE — Telephone Encounter (Signed)
I called pt. I have not received anything for xanax from [facility] on this pt. I faxed the xanax to [facility]. Received a receipt of confirmation. Pt verbalized understanding.

## 2016-07-07 NOTE — Telephone Encounter (Signed)
Pt request refill for ALPRAZolam (XANAX) 0.25 MG tablet, 6 mth refill.  Pt will be out of medication today. Pt said pharmacy has been sending requests this week. Please call her when this has been sent to pharmacy Pharmacy: [facility]

## 2016-07-07 NOTE — Telephone Encounter (Signed)
Pt calling back stating that she was told by Pharmacy: [facility] that they receieved a fax from [facility] stating that she is an unknown subscriber.  The pharmacist suggested that she call [facility].  Pt said she is down to one days worth and really needs it by the weekend.  She has been trying to get this medication since last Friday, please call

## 2016-08-24 DIAGNOSIS — Z124 Encounter for screening for malignant neoplasm of cervix: Secondary | ICD-10-CM | POA: Diagnosis not present

## 2016-08-24 DIAGNOSIS — Z6829 Body mass index (BMI) 29.0-29.9, adult: Secondary | ICD-10-CM | POA: Diagnosis not present

## 2016-08-24 DIAGNOSIS — Z01419 Encounter for gynecological examination (general) (routine) without abnormal findings: Secondary | ICD-10-CM | POA: Diagnosis not present

## 2016-08-31 DIAGNOSIS — Z1231 Encounter for screening mammogram for malignant neoplasm of breast: Secondary | ICD-10-CM | POA: Diagnosis not present

## 2016-09-29 DIAGNOSIS — Z0000 Encounter for general adult medical examination without abnormal findings: Secondary | ICD-10-CM | POA: Diagnosis not present

## 2016-09-29 DIAGNOSIS — I10 Essential (primary) hypertension: Secondary | ICD-10-CM | POA: Diagnosis not present

## 2016-10-06 DIAGNOSIS — E669 Obesity, unspecified: Secondary | ICD-10-CM | POA: Diagnosis not present

## 2016-10-06 DIAGNOSIS — Z1389 Encounter for screening for other disorder: Secondary | ICD-10-CM | POA: Diagnosis not present

## 2016-10-06 DIAGNOSIS — R42 Dizziness and giddiness: Secondary | ICD-10-CM | POA: Diagnosis not present

## 2016-10-06 DIAGNOSIS — Z0000 Encounter for general adult medical examination without abnormal findings: Secondary | ICD-10-CM | POA: Diagnosis not present

## 2016-10-06 DIAGNOSIS — M7061 Trochanteric bursitis, right hip: Secondary | ICD-10-CM | POA: Diagnosis not present

## 2016-10-06 DIAGNOSIS — I10 Essential (primary) hypertension: Secondary | ICD-10-CM | POA: Diagnosis not present

## 2016-10-07 DIAGNOSIS — Z1212 Encounter for screening for malignant neoplasm of rectum: Secondary | ICD-10-CM | POA: Diagnosis not present

## 2016-12-22 ENCOUNTER — Encounter: Payer: Self-pay | Admitting: Neurology

## 2016-12-22 ENCOUNTER — Ambulatory Visit (INDEPENDENT_AMBULATORY_CARE_PROVIDER_SITE_OTHER): Payer: BLUE CROSS/BLUE SHIELD | Admitting: Neurology

## 2016-12-22 DIAGNOSIS — H8122 Vestibular neuronitis, left ear: Secondary | ICD-10-CM | POA: Diagnosis not present

## 2016-12-22 DIAGNOSIS — R42 Dizziness and giddiness: Secondary | ICD-10-CM | POA: Diagnosis not present

## 2016-12-22 MED ORDER — ALPRAZOLAM 0.25 MG PO TABS: ORAL_TABLET | ORAL | 3 refills | Status: DC

## 2016-12-22 NOTE — Progress Notes (Signed)
[facility]  Provider:  Larey Seat, M D  Referring Provider: Marton Redwood, MD Primary Care Physician:  Marton Redwood, MD  Chief Complaint  Patient presents with  . Follow-up    pt alone, rm 11. pt states her dizziness is about the same. no changes.     HPI:  Erika Mitchell is a 61 y.o. caucasian, left handed, married female,   She has transferred care from Dr Erling Cruz. The patient had been seen by Dr. Erling Cruz ( last on 04-12-12) with a chief complaint of continued dizziness. She developed otitis media in 1998 involving pug predominantly the left ear. She was treated with Keflex antibiotic and later placed on Cipro as her symptoms continued. She noted the onset of dizziness after  the ear infection had been treated. At first she described a sensation of pressure and aching in both ears , as well as a sense of motion sickness, soon  aggravated by movement. She developed nausea and indigestion started to feel sick. She had a sensation of being in motion when not moving, but sitting still , driving a car or standing. The evaluation in 1998 included a negative study for Lyme disease, TSH, CBC and an MRI of the brain.  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 She was first seen on 10-09-96 by Dr. 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 She was already taking Xanax at the time and she has remained on the medication ever since , as this does have continued to control her symptoms.  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  Hearing tests followed in 2006 and documented 96% discrimination score in each ear-  there was no longer tinnitus,  hearing loss or headache - but the dizziness continued to the present date, worse if the patient doesn't take Xanax.  Her PCP is Dr.  Brigitte Pulse.  Interval history from 12-22-14. Erika Mitchell is here today for a routine revisit. I have seen her last on 06-18-14. We are refilling her medication today. The patient describes to triggers for her vertigo one is hayfever or allergies seasonal. These trigger vertigo. Second one is if she forgets her Xanax. She will usually develop a vertigo with an 3 or 4 hours after the dose was supposed to be taken.Vertigo can also be triggered by rapid motion or by a moving surface. She avoids stepping on board of a ship or boat, having extreme dizziness in those situations.   Interval history from 12/23/2015. 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 I have continued treating her with Xanax as initiated originally by Dr. Erling Cruz almost 20 years ago. Continues to be gainfully employed and functions as well as can be. She is preparing for gallbladder surgery and was wondering if the anesthesia may affect her vestibulitis. In fact, it can. She may experience a prolonged period of swimmy headedness lightheadedness and I would recommend that she should stay overnight and not be discharged ambulatory.  12-22-2016, Patient was established sclerotic vestibulitis but has only been responding to Xanax and control of dizziness-vertigo.  Continues to function well, drives and is gainfully employed on the medication.  Last use cholecystectomy went without complications.  Here today to meet with her and refill  her medication for the whole year.  Review of Systems: Out of a complete 14 system review, the patient complains of only the following symptoms, and all other reviewed systems are negative. Dizziness, anxiety. Vestibulitis.   Social History   Socioeconomic History  . Marital status: Married    Spouse name: Erika Mitchell  . Number of children: 1  . Years of education: 36  . Highest education level: Not on file  Social Needs  . Financial resource strain: Not on  file  . Food insecurity - worry: Not on file  . Food insecurity - inability: Not on file  . Transportation needs - medical: Not on file  . Transportation needs - non-medical: Not on file  Occupational History    Employer: [employer]  Tobacco Use  . Smoking status: Former Smoker    Last attempt to quit: 02/08/1996    Years since quitting: 20.8  . Smokeless tobacco: Never Used  Substance and Sexual Activity  . Alcohol use: Yes    Alcohol/week: 1.2 oz    Types: 2 Glasses of wine per week  . Drug use: No  . Sexual activity: Not on file  Other Topics Concern  . Not on file  Social History Narrative   Patient is married Erika Mitchell).     Patient is left-handed.   Patient is working full-time.   Patient has a college education (Associate's)   Patient has one child.   Patient drinks 3 Cups of coffee daily.    Family History  Problem Relation Age of Onset  . Colon cancer Neg Hx   . Lung cancer Mother     Past Medical History:  Diagnosis Date  . Hypertension   . Vertigo     Past Surgical History:  Procedure Laterality Date  . OOPHORECTOMY  1974&1990   rt then left ovary    Current Outpatient Medications  Medication Sig Dispense Refill  . acetaminophen (TYLENOL) 500 MG tablet Take 1,000 mg by mouth every 6 (six) hours as needed for pain.    Marland Kitchen ALPRAZolam (XANAX) 0.25 MG tablet Take four and one half tabs daily as directed 135 tablet 5  . losartan (COZAAR) 25 MG tablet Take 25 mg daily by mouth.    . Multiple Vitamin (MULTIVITAMIN) tablet Take 1 tablet by mouth daily.     No current facility-administered medications for this visit.     Allergies as of 12/22/2016 - Review Complete 12/22/2016  Allergen Reaction Noted  . Azithromycin  06/18/2014  . Epinephrine Other (See Comments) 08/24/2012  . Motrin [ibuprofen]  06/18/2014    Vitals: BP 139/75   Pulse 65   Ht 5\' 7"  (1.702 m)   Wt 191 lb (86.6 kg)   BMI 29.91 kg/m  Last Weight:  Wt Readings from Last 1  Encounters:  12/22/16 191 lb (86.6 kg)   Last Height:   Ht Readings from Last 1 Encounters:  12/22/16 5\' 7"  (1.702 m)    Physical exam:  General: The patient is awake, alert and appears not in acute distress. The patient is well groomed. Head: Normocephalic, atraumatic.  Neck is supple. Mallampati 1,  neck circumference: 15. 5 " , and uvula are midline no fasciculations no irritation and  no tongue bite. No evidence of bruxism.    No TMJ , no dysphagia- dysphonia.  Cardiovascular:  Regular rate and rhythm, without murmurs or carotid bruit, and without distended neck veins. Respiratory: Lungs are clear to auscultation. No rhonchi.  Skin:  Without evidence of  edema, or rash  Neurologic exam :The patient is awake and alert, oriented to place and time.  Memory subjective  described as intact. There is a normal attention span & concentration ability. Speech is fluent without dysarthria, dysphonia or aphasia.  Mood and affect are appropriate. She does not appear anxious or depressed, the patient doesn't have panic attacks. She is driving . She is gainfully employed, she maintains her daily rituals and all activities of daily living.  Cranial nerves: Normal taste and smell, Pupils are equal and briskly reactive to light.  Extraocular movements  Intact- Visual fields by finger perimetry are intact. Hearing to finger rub intact. Air and bone conduction appears symmetric, Rinne and Weber test negative.Facial motor strength is symmetric and tongue and uvula move midline. Shoulder shrug symmetric.  Motor exam:   Normal tone and muscle bulk and symmetric strength in all extremities. Sensory:  Fine touch, pinprick and vibration were normal. Coordination: Rapid alternating movements in the fingers/hands- Finger-to-nose maneuver tested and normal without evidence of ataxia, dysmetria or tremor. Gait and station: Patient walks without assistive device, able to climb up to the exam table. Strength  within normal limits. Stance is stable and normal.  Deep tendon reflexes: in the  upper and lower extremities are symmetric and intact. Babinski maneuver response is downgoing.   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 Vestibulitis of this kind is not treatable by vestibular rehab.  2) Xanax supressed vertigo and anxiety. She can drive and function. Her dizziness is less severe.  3) Rv 10 minutes.    Plan:  Treatment plan and additional workup : Please follow up with Np in next visit.   Xanax 0.25 ; refilled. 1/2 tab 4-5 times a day. 30 days , 5 refills.   Rv every 12 month,  NP next.   Larey Seat, MD  [facility]   cc Dr. Brigitte Pulse, MD

## 2017-02-28 DIAGNOSIS — D225 Melanocytic nevi of trunk: Secondary | ICD-10-CM | POA: Diagnosis not present

## 2017-02-28 DIAGNOSIS — D216 Benign neoplasm of connective and other soft tissue of trunk, unspecified: Secondary | ICD-10-CM | POA: Diagnosis not present

## 2017-02-28 DIAGNOSIS — D485 Neoplasm of uncertain behavior of skin: Secondary | ICD-10-CM | POA: Diagnosis not present

## 2017-04-18 ENCOUNTER — Telehealth: Payer: Self-pay | Admitting: Neurology

## 2017-04-18 ENCOUNTER — Other Ambulatory Visit: Payer: Self-pay | Admitting: Neurology

## 2017-04-18 DIAGNOSIS — H8122 Vestibular neuronitis, left ear: Secondary | ICD-10-CM

## 2017-04-18 DIAGNOSIS — R42 Dizziness and giddiness: Secondary | ICD-10-CM

## 2017-04-18 MED ORDER — ALPRAZOLAM 0.25 MG PO TABS: ORAL_TABLET | ORAL | 1 refills | Status: DC

## 2017-04-18 NOTE — Telephone Encounter (Signed)
Script will be sent today for the pt

## 2017-04-18 NOTE — Telephone Encounter (Signed)
Pt requesting a refill for ALPRAZolam (XANAX) 0.25 MG tablet sent to [facility]

## 2017-06-09 ENCOUNTER — Encounter: Payer: Self-pay | Admitting: *Deleted

## 2017-06-15 ENCOUNTER — Other Ambulatory Visit: Payer: Self-pay | Admitting: Neurology

## 2017-06-15 DIAGNOSIS — R42 Dizziness and giddiness: Secondary | ICD-10-CM

## 2017-06-15 DIAGNOSIS — H8122 Vestibular neuronitis, left ear: Secondary | ICD-10-CM

## 2017-06-16 ENCOUNTER — Other Ambulatory Visit: Payer: Self-pay | Admitting: Neurology

## 2017-06-16 DIAGNOSIS — R42 Dizziness and giddiness: Secondary | ICD-10-CM

## 2017-06-16 DIAGNOSIS — H8122 Vestibular neuronitis, left ear: Secondary | ICD-10-CM

## 2017-06-30 ENCOUNTER — Encounter: Payer: Self-pay | Admitting: Internal Medicine

## 2017-07-17 ENCOUNTER — Encounter: Payer: Self-pay | Admitting: Internal Medicine

## 2017-08-14 ENCOUNTER — Other Ambulatory Visit: Payer: Self-pay | Admitting: Neurology

## 2017-08-14 DIAGNOSIS — H8122 Vestibular neuronitis, left ear: Secondary | ICD-10-CM

## 2017-08-14 DIAGNOSIS — R42 Dizziness and giddiness: Secondary | ICD-10-CM

## 2017-08-17 ENCOUNTER — Telehealth: Payer: Self-pay | Admitting: Neurology

## 2017-08-17 NOTE — Telephone Encounter (Signed)
Pt requesting a call to discuss ALPRAZolam (XANAX) 0.25 MG tablet stating that the dosing that had been called in yesterday was not the same as normally prescribed. Please call to advise

## 2017-08-17 NOTE — Telephone Encounter (Signed)
Called the patient. No answer. LVM informing her that I was aware that the medication refill looked different from before. I had to go off of what Dr Brett Fairy had previously wrote in her most recent office visit note to fill for the pt. With Dr Brett Fairy being out of the office the work MD is only going to fill what was mentioned in the most recent note. Instructed the pt to call back to discuss further.

## 2017-08-17 NOTE — Telephone Encounter (Signed)
Pt called back and I informed her that I could only fill the medication for the amount that was discussed in her previous office note with Dr Brett Fairy.  Pt states her normal routine and for the last 35 years has been she takes 0.25 in the morning at 5:30 am, 0.25mg  at 7:30 and then the rest of the day 1/2 tablet throughout the day to equal 4.5 tablets a day. 135 tablets in a 30 day.  I informed her that I'm sorry I can see that has been what was ordered in the past but with Dr Brett Fairy being out the work in MD is only going to fill what was written in her most recent office note. I informed her that Dr Brett Fairy will return on Monday and I will discuss with her and see if she is ok with filling the rest of the medication. Pt verbalized understanding.

## 2017-08-21 ENCOUNTER — Other Ambulatory Visit: Payer: Self-pay | Admitting: Neurology

## 2017-08-21 MED ORDER — ALPRAZOLAM 0.25 MG PO TABS: ORAL_TABLET | ORAL | 5 refills | Status: DC

## 2017-08-21 NOTE — Telephone Encounter (Signed)
Spoke with Dr Brett Fairy and informed her that in the previous office note what she mentioned the pt was taking was different then what was being prescribed. Dr Dohmeier reviewed her chart and would like to continue what the pt has been on. I will re order and send to the pt for when she runs out.

## 2017-09-04 DIAGNOSIS — Z1231 Encounter for screening mammogram for malignant neoplasm of breast: Secondary | ICD-10-CM | POA: Diagnosis not present

## 2017-09-08 ENCOUNTER — Ambulatory Visit (AMBULATORY_SURGERY_CENTER): Payer: Self-pay

## 2017-09-08 VITALS — Ht 67.0 in | Wt 194.2 lb

## 2017-09-08 DIAGNOSIS — Z86010 Personal history of colonic polyps: Secondary | ICD-10-CM

## 2017-09-08 MED ORDER — NA SULFATE-K SULFATE-MG SULF 17.5-3.13-1.6 GM/177ML PO SOLN: 1.0000 | Freq: Once | ORAL | 0 refills | Status: AC

## 2017-09-08 NOTE — Progress Notes (Signed)
Per pt, no allergies to soy or egg products.Pt not taking any weight loss meds or using  O2 at home.  Pt refused emmi video. 

## 2017-09-22 ENCOUNTER — Ambulatory Visit (AMBULATORY_SURGERY_CENTER): Payer: BLUE CROSS/BLUE SHIELD | Admitting: Internal Medicine

## 2017-09-22 ENCOUNTER — Encounter: Payer: Self-pay | Admitting: Internal Medicine

## 2017-09-22 VITALS — BP 137/71 | HR 61 | Temp 99.3°F | Resp 16 | Ht 67.0 in | Wt 191.0 lb

## 2017-09-22 DIAGNOSIS — Z860101 Personal history of adenomatous and serrated colon polyps: Secondary | ICD-10-CM

## 2017-09-22 DIAGNOSIS — Z86010 Personal history of colonic polyps: Secondary | ICD-10-CM

## 2017-09-22 DIAGNOSIS — D120 Benign neoplasm of cecum: Secondary | ICD-10-CM | POA: Diagnosis not present

## 2017-09-22 DIAGNOSIS — Z1211 Encounter for screening for malignant neoplasm of colon: Secondary | ICD-10-CM | POA: Diagnosis not present

## 2017-09-22 MED ORDER — SODIUM CHLORIDE 0.9 % IV SOLN
500.0000 mL | Freq: Once | INTRAVENOUS | Status: DC
Start: 2017-09-22 — End: 2017-09-22

## 2017-09-22 NOTE — Patient Instructions (Addendum)
YOU HAD AN ENDOSCOPIC PROCEDURE TODAY AT [facility]:   Refer to the procedure report that was given to you for any specific questions about what was found during the examination.  If the procedure report does not answer your questions, please call your gastroenterologist to clarify.  If you requested that your care partner not be given the details of your procedure findings, then the procedure report has been included in a sealed envelope for you to review at your convenience later.  YOU SHOULD EXPECT: Some feelings of bloating in the abdomen. Passage of more gas than usual.  Walking can help get rid of the air that was put into your GI tract during the procedure and reduce the bloating. If you had a lower endoscopy (such as a colonoscopy or flexible sigmoidoscopy) you may notice spotting of blood in your stool or on the toilet paper. If you underwent a bowel prep for your procedure, you may not have a normal bowel movement for a few days.  Please Note:  You might notice some irritation and congestion in your nose or some drainage.  This is from the oxygen used during your procedure.  There is no need for concern and it should clear up in a day or so.  SYMPTOMS TO REPORT IMMEDIATELY:   Following lower endoscopy (colonoscopy or flexible sigmoidoscopy):  Excessive amounts of blood in the stool  Significant tenderness or worsening of abdominal pains  Swelling of the abdomen that is new, acute  Fever of 100F or higher   For urgent or emergent issues, a gastroenterologist can be reached at any hour by calling [phone].   DIET:  We do recommend a small meal at first, but then you may proceed to your regular diet.  Drink plenty of fluids but you should avoid alcoholic beverages for 24 hours.  ACTIVITY:  You should plan to take it easy for the rest of today and you should NOT DRIVE or use heavy machinery until tomorrow (because of the sedation medicines used during the test).     FOLLOW UP: Our staff will call the number listed on your records the next business day following your procedure to check on you and address any questions or concerns that you may have regarding the information given to you following your procedure. If we do not reach you, we will leave a message.  However, if you are feeling well and you are not experiencing any problems, there is no need to return our call.  We will assume that you have returned to your regular daily activities without incident.  If any biopsies were taken you will be contacted by phone or by letter within the next 1-3 weeks.  Please call us at [phone] if you have not heard about the biopsies in 3 weeks.    SIGNATURES/CONFIDENTIALITY: You and/or your care partner have signed paperwork which will be entered into your electronic medical record.  These signatures attest to the fact that that the information above on your After Visit Summary has been reviewed and is understood.  Full responsibility of the confidentiality of this discharge information lies with you and/or your care-partner.    Handouts were given to your care partner on polyps, hemorrhoids, and diverticulosis.  You may resume your current medications today. Await biopsy results. Please call if any questions or concerns.

## 2017-09-22 NOTE — Op Note (Signed)
[facility] Patient Name: Erika Mitchell Procedure Date: 09/22/2017 1:27 PM MRN: [id number] Endoscopist: Jerene Bears , MD Age: 62 Referring MD:  Date of Birth: [birth year] Gender: Female Account #: [id number] Procedure:                Colonoscopy Indications:              Surveillance: Personal history of adenomatous                            polyps on last colonoscopy 5 years ago Medicines:                Monitored Anesthesia Care Procedure:                Pre-Anesthesia Assessment:                           - Prior to the procedure, a History and Physical                            was performed, and patient medications and                            allergies were reviewed. The patient's tolerance of                            previous anesthesia was also reviewed. The risks                            and benefits of the procedure and the sedation                            options and risks were discussed with the patient.                            All questions were answered, and informed consent                            was obtained. Prior Anticoagulants: The patient has                            taken no previous anticoagulant or antiplatelet                            agents. ASA Grade Assessment: II - A patient with                            mild systemic disease. After reviewing the risks                            and benefits, the patient was deemed in                            satisfactory condition to undergo the procedure.  After obtaining informed consent, the colonoscope                            was passed under direct vision. Throughout the                            procedure, the patient's blood pressure, pulse, and                            oxygen saturations were monitored continuously. The                            Model PCF-[id number]) scope was introduced                            through the anus and  advanced to the cecum,                            identified by appendiceal orifice and ileocecal                            valve. The colonoscopy was performed without                            difficulty. The patient tolerated the procedure                            well. The quality of the bowel preparation was                            good. The ileocecal valve, appendiceal orifice, and                            rectum were photographed. Scope In: 1:38:16 PM Scope Out: 1:53:13 PM Scope Withdrawal Time: 0 hours 11 minutes 48 seconds  Total Procedure Duration: 0 hours 14 minutes 57 seconds  Findings:                 The digital rectal exam was normal.                           A 4 mm polyp was found in the cecum. The polyp was                            sessile. The polyp was removed with a cold snare.                            Resection and retrieval were complete.                           There was a small lipoma, in the ascending colon.                           Multiple small and large-mouthed diverticula were  found in the sigmoid colon and descending colon.                           Internal hemorrhoids were found during                            retroflexion. The hemorrhoids were small. Complications:            No immediate complications. Estimated Blood Loss:     Estimated blood loss was minimal. Impression:               - One 4 mm polyp in the cecum, removed with a cold                            snare. Resected and retrieved.                           - Small lipoma in the ascending colon.                           - Diverticulosis in the sigmoid colon and in the                            descending colon.                           - Small internal hemorrhoids. Recommendation:           - Patient has a contact number available for                            emergencies. The signs and symptoms of potential                             delayed complications were discussed with the                            patient. Return to normal activities tomorrow.                            Written discharge instructions were provided to the                            patient.                           - Resume previous diet.                           - Continue present medications.                           - Await pathology results.                           - Repeat colonoscopy is recommended for  surveillance. The colonoscopy date will be                            determined after pathology results from today's                            exam become available for review. Jerene Bears, MD 09/22/2017 1:56:38 PM This report has been signed electronically.

## 2017-09-22 NOTE — Progress Notes (Signed)
Report given to PACU, vss 

## 2017-09-22 NOTE — Progress Notes (Signed)
Called to room to assist during endoscopic procedure.  Patient ID and intended procedure confirmed with present staff. Received instructions for my participation in the procedure from the performing physician.  

## 2017-09-22 NOTE — Progress Notes (Signed)
No problems noted in the recovery room. maw 

## 2017-09-25 ENCOUNTER — Telehealth: Payer: Self-pay

## 2017-09-25 NOTE — Telephone Encounter (Signed)
  Follow up Call-  Call back number 09/22/2017  Post procedure Call Back phone  # [phone], [phone]  Permission to leave phone message Yes  Some recent data might be hidden     Patient questions:  Do you have a fever, pain , or abdominal swelling? No. Pain Score  0 *  Have you tolerated food without any problems? Yes.    Have you been able to return to your normal activities? Yes.    Do you have any questions about your discharge instructions: Diet   No. Medications  No. Follow up visit  No.  Do you have questions or concerns about your Care? No.  Actions: * If pain score is 4 or above: No action needed, pain <4.

## 2017-09-26 DIAGNOSIS — Z01419 Encounter for gynecological examination (general) (routine) without abnormal findings: Secondary | ICD-10-CM | POA: Diagnosis not present

## 2017-09-26 DIAGNOSIS — Z6830 Body mass index (BMI) 30.0-30.9, adult: Secondary | ICD-10-CM | POA: Diagnosis not present

## 2017-09-26 DIAGNOSIS — Z124 Encounter for screening for malignant neoplasm of cervix: Secondary | ICD-10-CM | POA: Diagnosis not present

## 2017-10-02 ENCOUNTER — Encounter: Payer: Self-pay | Admitting: Internal Medicine

## 2017-10-12 DIAGNOSIS — R82998 Other abnormal findings in urine: Secondary | ICD-10-CM | POA: Diagnosis not present

## 2017-10-12 DIAGNOSIS — I10 Essential (primary) hypertension: Secondary | ICD-10-CM | POA: Diagnosis not present

## 2017-10-17 DIAGNOSIS — R809 Proteinuria, unspecified: Secondary | ICD-10-CM | POA: Diagnosis not present

## 2017-10-17 DIAGNOSIS — Z1389 Encounter for screening for other disorder: Secondary | ICD-10-CM | POA: Diagnosis not present

## 2017-10-17 DIAGNOSIS — H8120 Vestibular neuronitis, unspecified ear: Secondary | ICD-10-CM | POA: Diagnosis not present

## 2017-10-17 DIAGNOSIS — I10 Essential (primary) hypertension: Secondary | ICD-10-CM | POA: Diagnosis not present

## 2017-10-17 DIAGNOSIS — R42 Dizziness and giddiness: Secondary | ICD-10-CM | POA: Diagnosis not present

## 2017-10-17 DIAGNOSIS — Z0000 Encounter for general adult medical examination without abnormal findings: Secondary | ICD-10-CM | POA: Diagnosis not present

## 2017-11-23 DIAGNOSIS — Z6831 Body mass index (BMI) 31.0-31.9, adult: Secondary | ICD-10-CM | POA: Diagnosis not present

## 2017-11-23 DIAGNOSIS — I10 Essential (primary) hypertension: Secondary | ICD-10-CM | POA: Diagnosis not present

## 2017-12-26 ENCOUNTER — Encounter: Payer: Self-pay | Admitting: Adult Health

## 2017-12-26 ENCOUNTER — Ambulatory Visit (INDEPENDENT_AMBULATORY_CARE_PROVIDER_SITE_OTHER): Payer: BLUE CROSS/BLUE SHIELD | Admitting: Adult Health

## 2017-12-26 VITALS — BP 149/72 | HR 64 | Ht 67.0 in | Wt 192.5 lb

## 2017-12-26 DIAGNOSIS — R42 Dizziness and giddiness: Secondary | ICD-10-CM

## 2017-12-26 DIAGNOSIS — H8122 Vestibular neuronitis, left ear: Secondary | ICD-10-CM | POA: Diagnosis not present

## 2017-12-26 NOTE — Patient Instructions (Signed)
Your Plan:  Continue Xanax If your symptoms worsen or you develop new symptoms please let us know.   Thank you for coming to see us at [facility]. I hope we have been able to provide you high quality care today.  You may receive a patient satisfaction survey over the next few weeks. We would appreciate your feedback and comments so that we may continue to improve ourselves and the health of our patients.     

## 2017-12-26 NOTE — Progress Notes (Signed)
PATIENT: Erika Mitchell DOB: [birth year]  REASON FOR VISIT: follow up HISTORY FROM: patient  HISTORY OF PRESENT ILLNESS: Today 12/26/17:  Erika Mitchell is a 62 year old female with a history of vestibular neuritis.  She returns today for follow-up.  She has been taking Xanax for half tablet daily.  She states that this works well for her dizziness.  She states that since she has been on this medication she has very few episodes and when she gets her medication.  She states that she is able to work full-time.  She is able to operate a motor vehicle without difficulty.  She returns today for evaluation.  HISTORY (Copied from Dr.Dohmeier's note) 12-22-2016, Patient was established sclerotic vestibulitis but has only been responding to Xanax and control of dizziness-vertigo.  Continues to function well, drives and is gainfully employed on the medication.  Last use cholecystectomy went without complications.  Here today to meet with her and refill her medication for the whole year.   REVIEW OF SYSTEMS: Out of a complete 14 system review of symptoms, the patient complains only of the following symptoms, and all other reviewed systems are negative.  Dizziness  ALLERGIES: Allergies  Allergen Reactions  . Epinephrine Other (See Comments)    "dizziness, rapid heartbeat"  . Azithromycin     dizziness  . Motrin [Ibuprofen]     dizziness    HOME MEDICATIONS: Outpatient Medications Prior to Visit  Medication Sig Dispense Refill  . acetaminophen (TYLENOL) 500 MG tablet Take 1,000 mg by mouth as needed.     . ALPRAZolam (XANAX) 0.25 MG tablet Take 4 and a half tablet daily as instructed 135 tablet 5  . Ascorbic Acid (VITA-C PO) Take by mouth. Vitamin C gummies-Chew 2 daily    . Multiple Vitamin (MULTIVITAMIN) tablet Take 1 tablet by mouth daily.    Marland Kitchen telmisartan (MICARDIS) 40 MG tablet Take 40 mg by mouth daily.    Marland Kitchen losartan (COZAAR) 25 MG tablet Take 25 mg daily by mouth.     No  facility-administered medications prior to visit.     PAST MEDICAL HISTORY: Past Medical History:  Diagnosis Date  . Cancer ([place])    basal cell carcinoma on left arm left eye brow  . Hypertension   . Vertigo   . Vestibular neuritis    right ear    PAST SURGICAL HISTORY: Past Surgical History:  Procedure Laterality Date  . CHOLECYSTECTOMY  2017  . laproscopy     surgery for endometriosisi  . OOPHORECTOMY  1974&1990   rt then left ovary    FAMILY HISTORY: Family History  Problem Relation Age of Onset  . Lung cancer Mother   . Aneurysm Father   . Heart disease Sister   . Atrial fibrillation Brother   . Colon cancer Neg Hx     SOCIAL HISTORY: Social History   Socioeconomic History  . Marital status: Married    Spouse name: [name]  . Number of children: 1  . Years of education: 82  . Highest education level: Not on file  Occupational History    Employer: [employer]  Social Needs  . Financial resource strain: Not on file  . Food insecurity:    Worry: Not on file    Inability: Not on file  . Transportation needs:    Medical: Not on file    Non-medical: Not on file  Tobacco Use  . Smoking status: Former Smoker    Last attempt to quit: 02/08/1996  Years since quitting: 21.8  . Smokeless tobacco: Never Used  Substance and Sexual Activity  . Alcohol use: Yes    Alcohol/week: 2.0 standard drinks    Types: 2 Glasses of wine per week  . Drug use: No  . Sexual activity: Not on file  Lifestyle  . Physical activity:    Days per week: Not on file    Minutes per session: Not on file  . Stress: Not on file  Relationships  . Social connections:    Talks on phone: Not on file    Gets together: Not on file    Attends religious service: Not on file    Active member of club or organization: Not on file    Attends meetings of clubs or organizations: Not on file    Relationship status: Not on file  . Intimate partner violence:    Fear of current or ex  partner: Not on file    Emotionally abused: Not on file    Physically abused: Not on file    Forced sexual activity: Not on file  Other Topics Concern  . Not on file  Social History Narrative   Patient is married [name]).     Patient is left-handed.   Patient is working full-time.   Patient has a college education (Associate's)   Patient has one child.   Patient drinks 3 Cups of coffee daily.      PHYSICAL EXAM  Vitals:   12/26/17 0709  BP: (!) 149/72  Pulse: 64  Weight: 192 lb 8 oz (87.3 kg)  Height: 5\' 7"  (1.702 m)   Body mass index is 30.15 kg/m.  Generalized: Well developed, in no acute distress   Neurological examination  Mentation: Alert oriented to time, place, history taking. Follows all commands speech and language fluent Cranial nerve II-XII: Pupils were equal round reactive to light. Extraocular movements were full, visual field were full on confrontational test. Facial sensation and strength were normal. Uvula tongue midline. Head turning and shoulder shrug  were normal and symmetric. Motor: The motor testing reveals 5 over 5 strength of all 4 extremities. Good symmetric motor tone is noted throughout.  Sensory: Sensory testing is intact to soft touch on all 4 extremities. No evidence of extinction is noted.  Coordination: Cerebellar testing reveals good finger-nose-finger and heel-to-shin bilaterally.  Gait and station: Gait is normal.   Reflexes: Deep tendon reflexes are symmetric and normal bilaterally.   DIAGNOSTIC DATA (LABS, IMAGING, TESTING) - I reviewed patient records, labs, notes, testing and imaging myself where available.    ASSESSMENT AND PLAN 62 y.o. year old female  has a past medical history of Cancer ([place]), Hypertension, Vertigo, and Vestibular neuritis. here with:  1.  Vestibular neuritis  Overall the patient has done well.  She will continue on Xanax.  I have advised that if her symptoms worsen or she develops new symptoms she should  let us know.  She will follow-up in 1 year or sooner if needed.  I spent 15 minutes with the patient. 50% of this time was spent reviewing plan of care   Erika Givens, MSN, NP-C 12/26/2017, 7:29 AM [facility] [address], [place], [place] [address] [phone]

## 2018-02-26 ENCOUNTER — Other Ambulatory Visit: Payer: Self-pay | Admitting: Neurology

## 2018-03-12 DIAGNOSIS — R300 Dysuria: Secondary | ICD-10-CM | POA: Diagnosis not present

## 2018-03-12 DIAGNOSIS — N390 Urinary tract infection, site not specified: Secondary | ICD-10-CM | POA: Diagnosis not present

## 2018-03-23 DIAGNOSIS — R300 Dysuria: Secondary | ICD-10-CM | POA: Diagnosis not present

## 2018-05-15 ENCOUNTER — Other Ambulatory Visit: Payer: Self-pay | Admitting: Neurology

## 2018-05-15 NOTE — Telephone Encounter (Signed)
Drug Registry checked  Last fill xanax 0.25mg  #135 on 04-25-18.  Next f/.u annual 01/01/19 with MM/NP.

## 2018-08-17 ENCOUNTER — Other Ambulatory Visit: Payer: Self-pay | Admitting: Neurology

## 2018-09-06 DIAGNOSIS — Z1231 Encounter for screening mammogram for malignant neoplasm of breast: Secondary | ICD-10-CM | POA: Diagnosis not present

## 2018-10-12 DIAGNOSIS — Z0000 Encounter for general adult medical examination without abnormal findings: Secondary | ICD-10-CM | POA: Diagnosis not present

## 2018-10-12 DIAGNOSIS — I10 Essential (primary) hypertension: Secondary | ICD-10-CM | POA: Diagnosis not present

## 2018-10-16 DIAGNOSIS — R82998 Other abnormal findings in urine: Secondary | ICD-10-CM | POA: Diagnosis not present

## 2018-10-19 DIAGNOSIS — H8120 Vestibular neuronitis, unspecified ear: Secondary | ICD-10-CM | POA: Diagnosis not present

## 2018-10-19 DIAGNOSIS — I10 Essential (primary) hypertension: Secondary | ICD-10-CM | POA: Diagnosis not present

## 2018-10-19 DIAGNOSIS — R809 Proteinuria, unspecified: Secondary | ICD-10-CM | POA: Diagnosis not present

## 2018-10-19 DIAGNOSIS — Z0000 Encounter for general adult medical examination without abnormal findings: Secondary | ICD-10-CM | POA: Diagnosis not present

## 2018-10-19 DIAGNOSIS — Z1331 Encounter for screening for depression: Secondary | ICD-10-CM | POA: Diagnosis not present

## 2018-10-19 DIAGNOSIS — R42 Dizziness and giddiness: Secondary | ICD-10-CM | POA: Diagnosis not present

## 2018-10-21 DIAGNOSIS — Z23 Encounter for immunization: Secondary | ICD-10-CM | POA: Diagnosis not present

## 2018-10-21 DIAGNOSIS — S61451A Open bite of right hand, initial encounter: Secondary | ICD-10-CM | POA: Diagnosis not present

## 2018-10-21 DIAGNOSIS — M79641 Pain in right hand: Secondary | ICD-10-CM | POA: Diagnosis not present

## 2018-10-23 DIAGNOSIS — Z01419 Encounter for gynecological examination (general) (routine) without abnormal findings: Secondary | ICD-10-CM | POA: Diagnosis not present

## 2018-10-23 DIAGNOSIS — Z124 Encounter for screening for malignant neoplasm of cervix: Secondary | ICD-10-CM | POA: Diagnosis not present

## 2018-10-23 DIAGNOSIS — Z6829 Body mass index (BMI) 29.0-29.9, adult: Secondary | ICD-10-CM | POA: Diagnosis not present

## 2018-10-25 DIAGNOSIS — Z1212 Encounter for screening for malignant neoplasm of rectum: Secondary | ICD-10-CM | POA: Diagnosis not present

## 2018-12-26 ENCOUNTER — Encounter: Payer: Self-pay | Admitting: Family Medicine

## 2018-12-26 ENCOUNTER — Other Ambulatory Visit: Payer: Self-pay

## 2018-12-26 ENCOUNTER — Ambulatory Visit (INDEPENDENT_AMBULATORY_CARE_PROVIDER_SITE_OTHER): Payer: BC Managed Care – PPO | Admitting: Family Medicine

## 2018-12-26 VITALS — BP 134/86 | HR 50 | Temp 97.6°F | Ht 67.0 in | Wt 192.0 lb

## 2018-12-26 DIAGNOSIS — H8122 Vestibular neuronitis, left ear: Secondary | ICD-10-CM | POA: Diagnosis not present

## 2018-12-26 MED ORDER — ALPRAZOLAM 0.25 MG PO TABS: ORAL_TABLET | ORAL | 5 refills | Status: DC

## 2018-12-26 NOTE — Progress Notes (Signed)
PATIENT: Erika Mitchell DOB: Feb 13, 1955  REASON FOR VISIT: follow up HISTORY FROM: patient  Chief Complaint  Patient presents with   Follow-up    Room 5, alone. Doing well. No concerns. So long as she takes her medications.     HISTORY OF PRESENT ILLNESS: Today 12/26/18 Erika Mitchell is a 63 y.o. female here today for follow up for vestibular neuritis. She continues alprazolam 0.25mg  4.5 tablets daily. Timing of doses are full tablet in 5:30am, full tablet around 7:30, 1/2 tablet at 9:30a,11a, 1a, and 3p. 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 She denies any difficulty driving. She feels medication helps significantly. No concerns of respiratory depression sleepiness or sedation. She has taken this dose for 30 years.    HISTORY: (copied from  note on 12/26/2017)  Erika Mitchell is a 64 year old female with a history of vestibular neuritis.  She returns today for follow-up.  She has been taking Xanax for half tablet daily.  She states that this works well for her dizziness.  She states that since she has been on this medication she has very few episodes and when she gets her medication.  She states that she is able to work full-time.  She is able to operate a motor vehicle without difficulty.  She returns today for evaluation.  HISTORY (Copied from Dr.Dohmeier's note)  12-22-2016,Patient was established sclerotic vestibulitis but has only been responding to Xanax and control of dizziness-vertigo. Continues to function well, drives and is gainfully employed on the medication. Last use cholecystectomy went without complications. Here today to meet with her and refill her medication for the whole year.   REVIEW OF SYSTEMS: Out of a complete 14 system review of symptoms, the patient complains only of the following symptoms, dizziness and all other reviewed systems are negative.  ALLERGIES: Allergies  Allergen Reactions   Epinephrine Other (See Comments)     "dizziness, rapid heartbeat"   Azithromycin     dizziness   Motrin [Ibuprofen]     dizziness    HOME MEDICATIONS: Outpatient Medications Prior to Visit  Medication Sig Dispense Refill   acetaminophen (TYLENOL) 500 MG tablet Take 1,000 mg by mouth as needed.      Ascorbic Acid (VITA-C PO) Take by mouth. Vitamin C gummies-Chew 2 daily     Multiple Vitamin (MULTIVITAMIN) tablet Take 1 tablet by mouth daily.     telmisartan (MICARDIS) 40 MG tablet Take 40 mg by mouth daily.     ALPRAZolam (XANAX) 0.25 MG tablet TAKE 4 AND 1/2 TABLETS BY MOUTH DAILY ASINSTRUCTED 135 tablet 4   No facility-administered medications prior to visit.     PAST MEDICAL HISTORY: Past Medical History:  Diagnosis Date   Cancer ([place])    basal cell carcinoma on left arm left eye brow   Hypertension    Vertigo    Vestibular neuritis    right ear    PAST SURGICAL HISTORY: Past Surgical History:  Procedure Laterality Date   CHOLECYSTECTOMY  2017   laproscopy     surgery for endometriosisi   OOPHORECTOMY  1974&1990   rt then left ovary    FAMILY HISTORY: Family History  Problem Relation Age of Onset   Lung cancer Mother    Aneurysm Father    Heart disease Sister    Atrial fibrillation Brother    Colon cancer Neg Hx     SOCIAL HISTORY: Social History   Socioeconomic History   Marital status: Married  Spouse name: [name]   Number of children: 1   Years of education: 14   Highest education level: Not on file  Occupational History    Employer: [employer]  Social Needs   Financial resource strain: Not on file   Food insecurity    Worry: Not on file    Inability: Not on file   Transportation needs    Medical: Not on file    Non-medical: Not on file  Tobacco Use   Smoking status: Former Smoker    Quit date: 02/08/1996    Years since quitting: 22.8   Smokeless tobacco: Never Used  Substance and Sexual Activity   Alcohol use: Yes    Alcohol/week:  2.0 standard drinks    Types: 2 Glasses of wine per week   Drug use: No   Sexual activity: Not on file  Lifestyle   Physical activity    Days per week: Not on file    Minutes per session: Not on file   Stress: Not on file  Relationships   Social connections    Talks on phone: Not on file    Gets together: Not on file    Attends religious service: Not on file    Active member of club or organization: Not on file    Attends meetings of clubs or organizations: Not on file    Relationship status: Not on file   Intimate partner violence    Fear of current or ex partner: Not on file    Emotionally abused: Not on file    Physically abused: Not on file    Forced sexual activity: Not on file  Other Topics Concern   Not on file  Social History Narrative   Patient is married [name]).     Patient is left-handed.   Patient is working full-time.   Patient has a college education (Associate's)   Patient has one child.   Patient drinks 3 Cups of coffee daily.      PHYSICAL EXAM  Vitals:   12/26/18 1329  BP: 134/86  Pulse: (!) 50  Temp: 97.6 F (36.4 C)  Weight: 192 lb (87.1 kg)  Height: 5\' 7"  (1.702 m)   Body mass index is 30.07 kg/m.  Generalized: Well developed, in no acute distress  Cardiology: normal rate and rhythm, no murmur noted Neurological examination  Mentation: Alert oriented to time, place, history taking. Follows all commands speech and language fluent Cranial nerve II-XII: Pupils were equal round reactive to light. Extraocular movements were full, visual field were full on confrontational test. Facial sensation and strength were normal. Uvula tongue midline. Head turning and shoulder shrug  were normal and symmetric. Motor: The motor testing reveals 5 over 5 strength of all 4 extremities. Good symmetric motor tone is noted throughout.  Sensory: Sensory testing is intact to soft touch on all 4 extremities. No evidence of extinction is noted.    Coordination: Cerebellar testing reveals good finger-nose-finger and heel-to-shin bilaterally.  Gait and station: Gait is normal.   DIAGNOSTIC DATA (LABS, IMAGING, TESTING) - I reviewed patient records, labs, notes, testing and imaging myself where available.  No flowsheet data found.   No results found for: WBC, HGB, HCT, MCV, PLT No results found for: NA, K, CL, CO2, GLUCOSE, BUN, CREATININE, CALCIUM, PROT, ALBUMIN, AST, ALT, ALKPHOS, BILITOT, GFRNONAA, GFRAA No results found for: CHOL, HDL, LDLCALC, LDLDIRECT, TRIG, CHOLHDL No results found for: HGBA1C No results found for: VITAMINB12 No results found for: TSH  ASSESSMENT AND PLAN 63 y.o. year old female  has a past medical history of Cancer ([place]), Hypertension, Vertigo, and Vestibular neuritis. here with    ICD-10-CM   1. Vestibular neuronitis of left ear  H81.22 ALPRAZolam (XANAX) 0.25 MG tablet    She continues to do very well with alprazolam 0.25mg  4.5 tablets daily. We have discussed risks with long term benzodiazepine use. She is fully functional when taking this medication. [place]MP[place] and refills appropriate. She will be mindful of dose and only take dose needed. May decrease dose slowly if symptoms are stable. She will follow up annually and will call sooner for any new or worsening concerns.    No orders of the defined types were placed in this encounter.    Meds ordered this encounter  Medications   ALPRAZolam (XANAX) 0.25 MG tablet    Sig: Take 4.5 tablet daily for vestibular neuritis    Dispense:  135 tablet    Refill:  5    Order Specific Question:   Supervising Provider    Answer:   Melvenia Beam [id number]      I spent 15 minutes with the patient. 50% of this time was spent counseling and educating patient on plan of care and medications.    Debbora Presto, FNP-C 12/26/2018, 8:13 PM [facility] [address] [place], [place] [address] [phone]

## 2018-12-26 NOTE — Patient Instructions (Addendum)
  Continue current treatment plan.   Follow up in 6 months, sooner if needed

## 2019-01-01 ENCOUNTER — Ambulatory Visit: Payer: BLUE CROSS/BLUE SHIELD | Admitting: Adult Health

## 2019-01-01 ENCOUNTER — Ambulatory Visit: Payer: BLUE CROSS/BLUE SHIELD | Admitting: Family Medicine

## 2019-02-04 ENCOUNTER — Ambulatory Visit: Payer: BLUE CROSS/BLUE SHIELD | Admitting: Family Medicine

## 2019-05-15 DIAGNOSIS — H5213 Myopia, bilateral: Secondary | ICD-10-CM | POA: Diagnosis not present

## 2019-06-26 ENCOUNTER — Other Ambulatory Visit: Payer: Self-pay

## 2019-06-26 ENCOUNTER — Encounter: Payer: Self-pay | Admitting: Family Medicine

## 2019-06-26 ENCOUNTER — Ambulatory Visit (INDEPENDENT_AMBULATORY_CARE_PROVIDER_SITE_OTHER): Payer: BC Managed Care – PPO | Admitting: Family Medicine

## 2019-06-26 DIAGNOSIS — H8122 Vestibular neuronitis, left ear: Secondary | ICD-10-CM | POA: Diagnosis not present

## 2019-06-26 MED ORDER — ALPRAZOLAM 0.25 MG PO TABS: ORAL_TABLET | ORAL | 5 refills | Status: DC

## 2019-06-26 NOTE — Patient Instructions (Addendum)
We will continue Xanax as prescribed for vestibular neuritis. Please continue healthy lifestyle habits. Stay well hydrated. Follow up closely with PCP. May continue refills with Dr Brigitte Pulse if he is willing as you have been very stable with no concerns of medication misuse. I will be happy to continue follow up every 6 months if he wishes.     Dizziness Dizziness is a common problem. It makes you feel unsteady or light-headed. You may feel like you are about to pass out (faint). Dizziness can lead to getting hurt if you stumble or fall. Dizziness can be caused by many things, including:  Medicines.  Not having enough water in your body (dehydration).  Illness. Follow these instructions at home: Eating and drinking   Drink enough fluid to keep your pee (urine) clear or pale yellow. This helps to keep you from getting dehydrated. Try to drink more clear fluids, such as water.  Do not drink alcohol.  Limit how much caffeine you drink or eat, if your doctor tells you to do that.  Limit how much salt (sodium) you drink or eat, if your doctor tells you to do that. Activity   Avoid making quick movements. ? When you stand up from sitting in a chair, steady yourself until you feel okay. ? In the morning, first sit up on the side of the bed. When you feel okay, stand slowly while you hold onto something. Do this until you know that your balance is fine.  If you need to stand in one place for a long time, move your legs often. Tighten and relax the muscles in your legs while you are standing.  Do not drive or use heavy machinery if you feel dizzy.  Avoid bending down if you feel dizzy. Place items in your home so you can reach them easily without leaning over. Lifestyle  Do not use any products that contain nicotine or tobacco, such as cigarettes and e-cigarettes. If you need help quitting, ask your doctor.  Try to lower your stress level. You can do this by using methods such as yoga or  meditation. Talk with your doctor if you need help. General instructions  Watch your dizziness for any changes.  Take over-the-counter and prescription medicines only as told by your doctor. Talk with your doctor if you think that you are dizzy because of a medicine that you are taking.  Tell a friend or a family member that you are feeling dizzy. If he or she notices any changes in your behavior, have this person call your doctor.  Keep all follow-up visits as told by your doctor. This is important. Contact a doctor if:  Your dizziness does not go away.  Your dizziness or light-headedness gets worse.  You feel sick to your stomach (nauseous).  You have trouble hearing.  You have new symptoms.  You are unsteady on your feet.  You feel like the room is spinning. Get help right away if:  You throw up (vomit) or have watery poop (diarrhea), and you cannot eat or drink anything.  You have trouble: ? Talking. ? Walking. ? Swallowing. ? Using your arms, hands, or legs.  You feel generally weak.  You are not thinking clearly, or you have trouble forming sentences. A friend or family member may notice this.  You have: ? Chest pain. ? Pain in your belly (abdomen). ? Shortness of breath. ? Sweating.  Your vision changes.  You are bleeding.  You have a very bad headache.  You have neck pain or a stiff neck.  You have a fever. These symptoms may be an emergency. Do not wait to see if the symptoms will go away. Get medical help right away. Call your local emergency services (911 in the [place] Do not drive yourself to the hospital. Summary  Dizziness makes you feel unsteady or light-headed. You may feel like you are about to pass out (faint).  Drink enough fluid to keep your pee (urine) clear or pale yellow. Do not drink alcohol.  Avoid making quick movements if you feel dizzy.  Watch your dizziness for any changes. This information is not intended to replace advice  given to you by your health care provider. Make sure you discuss any questions you have with your health care provider. Document Revised: 01/27/2017 Document Reviewed: 02/11/2016 Elsevier Patient Education  [place].

## 2019-06-26 NOTE — Progress Notes (Signed)
PATIENT: Erika Mitchell DOB: [birth year]  REASON FOR VISIT: follow up HISTORY FROM: patient  Chief Complaint  Patient presents with  . Follow-up    Rm 8, alone,  Stable, (no worse).   . Dizziness     HISTORY OF PRESENT ILLNESS: Today 06/26/19 Erika Mitchell is a 64 y.o. female here today for follow up for vestibular neuritis. She is doing very well. She continues Xanax 0.25mg  throughout the day. She continues to work full time. She drives without difficulty. She denies adverse effects of medication. She is seen yearly for CPE with PCP. She denies any concerns today.    HISTORY: (copied from my note on 12/26/2018)  Erika Mitchell is a 64 y.o. female here today for follow up for vestibular neuritis. She continues alprazolam 0.25mg  4.5 tablets daily. Timing of doses are full tablet in 5:30am, full tablet around 7:30, 1/2 tablet at 9:30a,11a, 1a, and 3p. 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 She denies any difficulty driving. She feels medication helps significantly. No concerns of respiratory depression sleepiness or sedation. She has taken this dose for 30 years.    HISTORY: (copied from  note on 12/26/2017)  Erika Mitchell a 64 year old female with a history of vestibular neuritis. She returns today for follow-up. She has been taking Xanax for half tablet daily. She states that this works well for her dizziness. She states that since she has been on this medication she has very few episodes and when she gets her medication. She states that she is able to work full-time. She is able to operate a motor vehicle without difficulty. She returns today for evaluation.  HISTORY(Copied from Dr.Dohmeier's note)  12-22-2016,Patient was established sclerotic vestibulitis but has only been responding to Xanax and control of dizziness-vertigo. Continues to function well, drives and is gainfully employed on the medication. Last use cholecystectomy went  without complications. Here today to meet with her and refill her medication for the whole year.   REVIEW OF SYSTEMS: Out of a complete 14 system review of symptoms, the patient complains only of the following symptoms, dizziness and all other reviewed systems are negative.  ALLERGIES: Allergies  Allergen Reactions  . Epinephrine Other (See Comments)    "dizziness, rapid heartbeat"  . Azithromycin     dizziness  . Motrin [Ibuprofen]     dizziness    HOME MEDICATIONS: Outpatient Medications Prior to Visit  Medication Sig Dispense Refill  . acetaminophen (TYLENOL) 500 MG tablet Take 1,000 mg by mouth as needed.     . Ascorbic Acid (VITA-C PO) Take by mouth. Vitamin C gummies-Chew 2 daily    . Multiple Vitamin (MULTIVITAMIN) tablet Take 1 tablet by mouth daily.    Marland Kitchen telmisartan (MICARDIS) 40 MG tablet Take 40 mg by mouth daily.    Marland Kitchen ALPRAZolam (XANAX) 0.25 MG tablet Take 4.5 tablet daily for vestibular neuritis 135 tablet 5   No facility-administered medications prior to visit.    PAST MEDICAL HISTORY: Past Medical History:  Diagnosis Date  . Cancer ([place])    basal cell carcinoma on left arm left eye brow  . Hypertension   . Vertigo   . Vestibular neuritis    right ear    PAST SURGICAL HISTORY: Past Surgical History:  Procedure Laterality Date  . CHOLECYSTECTOMY  2017  . laproscopy     surgery for endometriosisi  . OOPHORECTOMY  [id number]   rt then left ovary    FAMILY HISTORY: Family History  Problem Relation Age of Onset  . Lung cancer Mother   . Aneurysm Father   . Heart disease Sister   . Atrial fibrillation Brother   . Colon cancer Neg Hx     SOCIAL HISTORY: Social History   Socioeconomic History  . Marital status: Married    Spouse name: [name]  . Number of children: 1  . Years of education: 20  . Highest education level: Not on file  Occupational History    Employer: [employer]  Tobacco Use  . Smoking status: Former Smoker     Quit date: 02/08/1996    Years since quitting: 23.3  . Smokeless tobacco: Never Used  Substance and Sexual Activity  . Alcohol use: Yes    Alcohol/week: 2.0 standard drinks    Types: 2 Glasses of wine per week  . Drug use: No  . Sexual activity: Not on file  Other Topics Concern  . Not on file  Social History Narrative   Patient is married [name]).     Patient is left-handed.   Patient is working full-time.   Patient has a college education (Associate's)   Patient has one child.   Patient drinks 3 Cups of coffee daily.   Social Determinants of Health   Financial Resource Strain:   . Difficulty of Paying Living Expenses:   Food Insecurity:   . Worried About Charity fundraiser in the Last Year:   . Arboriculturist in the Last Year:   Transportation Needs:   . Film/video editor (Medical):   Marland Kitchen Lack of Transportation (Non-Medical):   Physical Activity:   . Days of Exercise per Week:   . Minutes of Exercise per Session:   Stress:   . Feeling of Stress :   Social Connections:   . Frequency of Communication with Friends and Family:   . Frequency of Social Gatherings with Friends and Family:   . Attends Religious Services:   . Active Member of Clubs or Organizations:   . Attends Archivist Meetings:   Marland Kitchen Marital Status:   Intimate Partner Violence:   . Fear of Current or Ex-Partner:   . Emotionally Abused:   Marland Kitchen Physically Abused:   . Sexually Abused:       PHYSICAL EXAM  Vitals:   06/26/19 1330  BP: (!) 150/82  Pulse: (!) 56  Weight: 191 lb (86.6 kg)  Height: 5\' 7"  (1.702 m)   Body mass index is 29.91 kg/m.  Generalized: Well developed, in no acute distress  Cardiology: normal rate and rhythm, no murmur noted Respiratory: clear to auscultation bilaterally  Neurological examination  Mentation: Alert oriented to time, place, history taking. Follows all commands speech and language fluent Cranial nerve II-XII: Pupils were equal round reactive to  light. Extraocular movements were full, visual field were full  Motor: The motor testing reveals 5 over 5 strength of all 4 extremities. Good symmetric motor tone is noted throughout.  Sensory: Sensory testing is intact to soft touch on all 4 extremities. No evidence of extinction is noted.  Coordination: Cerebellar testing reveals good finger-nose-finger and heel-to-shin bilaterally.  Gait and station: Gait is normal.  Reflexes: Deep tendon reflexes are symmetric and normal bilaterally.   DIAGNOSTIC DATA (LABS, IMAGING, TESTING) - I reviewed patient records, labs, notes, testing and imaging myself where available.  No flowsheet data found.   No results found for: WBC, HGB, HCT, MCV, PLT No results found for: NA, K, CL, CO2, GLUCOSE, BUN,  CREATININE, CALCIUM, PROT, ALBUMIN, AST, ALT, ALKPHOS, BILITOT, GFRNONAA, GFRAA No results found for: CHOL, HDL, LDLCALC, LDLDIRECT, TRIG, CHOLHDL No results found for: HGBA1C No results found for: VITAMINB12 No results found for: TSH     ASSESSMENT AND PLAN 64 y.o. year old female  has a past medical history of Cancer ([place]), Hypertension, Vertigo, and Vestibular neuritis. here with     ICD-10-CM   1. Vestibular neuronitis of left ear  H81.22 ALPRAZolam (XANAX) 0.25 MG tablet    Elah is doing very well on Xanax 0.25mg  throughout the day. She takes a total of 4.5 tablets daily (1.25mg  total). She has demonstrated responsible use of benzodiazepine and gets significant relief of dizziness. She was encouraged to continue healthy lifestyle habits. She will follow up with PCP as advised. She may continue refills with PCP if willing as she has been stable for years with no recent dose adjustments. I will be happy to continue every 6 month follow up if needed. She verbalizes understanding and agreement with this plan.    No orders of the defined types were placed in this encounter.    Meds ordered this encounter  Medications  . ALPRAZolam (XANAX) 0.25  MG tablet    Sig: Take 4.5 tablet daily for vestibular neuritis    Dispense:  135 tablet    Refill:  5    Order Specific Question:   Supervising Provider    Answer:   Melvenia Beam [id number]      I spent 15 minutes with the patient. 50% of this time was spent counseling and educating patient on plan of care and medications.    Debbora Presto, FNP-C 06/26/2019, 2:10 PM [facility] [address], [place], [place] [address] [phone]

## 2019-09-12 DIAGNOSIS — Z1231 Encounter for screening mammogram for malignant neoplasm of breast: Secondary | ICD-10-CM | POA: Diagnosis not present

## 2019-10-16 DIAGNOSIS — H43812 Vitreous degeneration, left eye: Secondary | ICD-10-CM | POA: Diagnosis not present

## 2019-10-24 DIAGNOSIS — Z0000 Encounter for general adult medical examination without abnormal findings: Secondary | ICD-10-CM | POA: Diagnosis not present

## 2019-10-24 DIAGNOSIS — R7989 Other specified abnormal findings of blood chemistry: Secondary | ICD-10-CM | POA: Diagnosis not present

## 2019-11-06 DIAGNOSIS — Z1331 Encounter for screening for depression: Secondary | ICD-10-CM | POA: Diagnosis not present

## 2019-11-06 DIAGNOSIS — I10 Essential (primary) hypertension: Secondary | ICD-10-CM | POA: Diagnosis not present

## 2019-11-06 DIAGNOSIS — Z0000 Encounter for general adult medical examination without abnormal findings: Secondary | ICD-10-CM | POA: Diagnosis not present

## 2019-11-06 DIAGNOSIS — R82998 Other abnormal findings in urine: Secondary | ICD-10-CM | POA: Diagnosis not present

## 2019-11-13 DIAGNOSIS — Z01419 Encounter for gynecological examination (general) (routine) without abnormal findings: Secondary | ICD-10-CM | POA: Diagnosis not present

## 2019-11-14 DIAGNOSIS — H43393 Other vitreous opacities, bilateral: Secondary | ICD-10-CM | POA: Diagnosis not present

## 2019-11-14 DIAGNOSIS — H43813 Vitreous degeneration, bilateral: Secondary | ICD-10-CM | POA: Diagnosis not present

## 2019-11-14 DIAGNOSIS — D3132 Benign neoplasm of left choroid: Secondary | ICD-10-CM | POA: Diagnosis not present

## 2019-11-14 DIAGNOSIS — H35423 Microcystoid degeneration of retina, bilateral: Secondary | ICD-10-CM | POA: Diagnosis not present

## 2019-11-18 DIAGNOSIS — Z1212 Encounter for screening for malignant neoplasm of rectum: Secondary | ICD-10-CM | POA: Diagnosis not present

## 2019-12-31 ENCOUNTER — Ambulatory Visit: Payer: BC Managed Care – PPO | Admitting: Family Medicine

## 2020-09-14 DIAGNOSIS — Z1231 Encounter for screening mammogram for malignant neoplasm of breast: Secondary | ICD-10-CM | POA: Diagnosis not present

## 2020-11-23 ENCOUNTER — Emergency Department (HOSPITAL_COMMUNITY): Payer: BC Managed Care – PPO

## 2020-11-23 ENCOUNTER — Other Ambulatory Visit: Payer: Self-pay

## 2020-11-23 ENCOUNTER — Inpatient Hospital Stay (HOSPITAL_COMMUNITY)
Admission: EM | Admit: 2020-11-23 | Discharge: 2020-11-26 | DRG: 025 | Disposition: A | Discharge status: Home / Self Care | Payer: BC Managed Care – PPO | Attending: Neurological Surgery | Admitting: Neurological Surgery

## 2020-11-23 ENCOUNTER — Encounter (HOSPITAL_COMMUNITY): Payer: Self-pay

## 2020-11-23 DIAGNOSIS — R569 Unspecified convulsions: Secondary | ICD-10-CM | POA: Diagnosis present

## 2020-11-23 DIAGNOSIS — G936 Cerebral edema: Secondary | ICD-10-CM | POA: Diagnosis not present

## 2020-11-23 DIAGNOSIS — I10 Essential (primary) hypertension: Secondary | ICD-10-CM | POA: Diagnosis not present

## 2020-11-23 DIAGNOSIS — C719 Malignant neoplasm of brain, unspecified: Secondary | ICD-10-CM | POA: Diagnosis present

## 2020-11-23 DIAGNOSIS — G9389 Other specified disorders of brain: Secondary | ICD-10-CM

## 2020-11-23 DIAGNOSIS — Z20822 Contact with and (suspected) exposure to covid-19: Secondary | ICD-10-CM | POA: Diagnosis not present

## 2020-11-23 DIAGNOSIS — Z801 Family history of malignant neoplasm of trachea, bronchus and lung: Secondary | ICD-10-CM

## 2020-11-23 DIAGNOSIS — Z808 Family history of malignant neoplasm of other organs or systems: Secondary | ICD-10-CM | POA: Diagnosis not present

## 2020-11-23 DIAGNOSIS — R4182 Altered mental status, unspecified: Secondary | ICD-10-CM | POA: Diagnosis present

## 2020-11-23 DIAGNOSIS — D496 Neoplasm of unspecified behavior of brain: Secondary | ICD-10-CM | POA: Diagnosis present

## 2020-11-23 DIAGNOSIS — J9811 Atelectasis: Secondary | ICD-10-CM | POA: Diagnosis not present

## 2020-11-23 DIAGNOSIS — Z8249 Family history of ischemic heart disease and other diseases of the circulatory system: Secondary | ICD-10-CM

## 2020-11-23 DIAGNOSIS — C710 Malignant neoplasm of cerebrum, except lobes and ventricles: Secondary | ICD-10-CM | POA: Diagnosis not present

## 2020-11-23 DIAGNOSIS — H8122 Vestibular neuronitis, left ear: Secondary | ICD-10-CM | POA: Diagnosis not present

## 2020-11-23 DIAGNOSIS — Z87891 Personal history of nicotine dependence: Secondary | ICD-10-CM | POA: Diagnosis not present

## 2020-11-23 DIAGNOSIS — Z85828 Personal history of other malignant neoplasm of skin: Secondary | ICD-10-CM

## 2020-11-23 DIAGNOSIS — Z881 Allergy status to other antibiotic agents status: Secondary | ICD-10-CM

## 2020-11-23 DIAGNOSIS — K429 Umbilical hernia without obstruction or gangrene: Secondary | ICD-10-CM | POA: Diagnosis not present

## 2020-11-23 DIAGNOSIS — Z9049 Acquired absence of other specified parts of digestive tract: Secondary | ICD-10-CM | POA: Diagnosis not present

## 2020-11-23 DIAGNOSIS — G40209 Localization-related (focal) (partial) symptomatic epilepsy and epileptic syndromes with complex partial seizures, not intractable, without status epilepticus: Secondary | ICD-10-CM | POA: Diagnosis not present

## 2020-11-23 DIAGNOSIS — H8110 Benign paroxysmal vertigo, unspecified ear: Secondary | ICD-10-CM | POA: Diagnosis not present

## 2020-11-23 DIAGNOSIS — I700 Atherosclerosis of aorta: Secondary | ICD-10-CM | POA: Diagnosis not present

## 2020-11-23 DIAGNOSIS — G935 Compression of brain: Secondary | ICD-10-CM | POA: Diagnosis present

## 2020-11-23 DIAGNOSIS — C729 Malignant neoplasm of central nervous system, unspecified: Secondary | ICD-10-CM | POA: Diagnosis not present

## 2020-11-23 DIAGNOSIS — G939 Disorder of brain, unspecified: Secondary | ICD-10-CM | POA: Diagnosis not present

## 2020-11-23 DIAGNOSIS — Z888 Allergy status to other drugs, medicaments and biological substances status: Secondary | ICD-10-CM | POA: Diagnosis not present

## 2020-11-23 DIAGNOSIS — J984 Other disorders of lung: Secondary | ICD-10-CM | POA: Diagnosis not present

## 2020-11-23 DIAGNOSIS — Z79899 Other long term (current) drug therapy: Secondary | ICD-10-CM | POA: Diagnosis not present

## 2020-11-23 DIAGNOSIS — Z886 Allergy status to analgesic agent status: Secondary | ICD-10-CM

## 2020-11-23 DIAGNOSIS — C801 Malignant (primary) neoplasm, unspecified: Secondary | ICD-10-CM | POA: Diagnosis not present

## 2020-11-23 DIAGNOSIS — E876 Hypokalemia: Secondary | ICD-10-CM | POA: Diagnosis not present

## 2020-11-23 LAB — CBC WITH DIFFERENTIAL/PLATELET
Abs Immature Granulocytes: 0.01 10*3/uL (ref 0.00–0.07)
Basophils Absolute: 0 10*3/uL (ref 0.0–0.1)
Basophils Relative: 1 %
Eosinophils Absolute: 0 10*3/uL (ref 0.0–0.5)
Eosinophils Relative: 1 %
HCT: 44.4 % (ref 36.0–46.0)
Hemoglobin: 15 g/dL (ref 12.0–15.0)
Immature Granulocytes: 0 %
Lymphocytes Relative: 35 %
Lymphs Abs: 1.4 10*3/uL (ref 0.7–4.0)
MCH: 32.3 pg (ref 26.0–34.0)
MCHC: 33.8 g/dL (ref 30.0–36.0)
MCV: 95.5 fL (ref 80.0–100.0)
Monocytes Absolute: 0.3 10*3/uL (ref 0.1–1.0)
Monocytes Relative: 6 %
Neutro Abs: 2.3 10*3/uL (ref 1.7–7.7)
Neutrophils Relative %: 57 %
Platelets: 184 10*3/uL (ref 150–400)
RBC: 4.65 MIL/uL (ref 3.87–5.11)
RDW: 12.9 % (ref 11.5–15.5)
WBC: 4.1 10*3/uL (ref 4.0–10.5)
nRBC: 0 % (ref 0.0–0.2)

## 2020-11-23 LAB — COMPREHENSIVE METABOLIC PANEL
ALT: 25 U/L (ref 0–44)
AST: 23 U/L (ref 15–41)
Albumin: 4.6 g/dL (ref 3.5–5.0)
Alkaline Phosphatase: 58 U/L (ref 38–126)
Anion gap: 7 (ref 5–15)
BUN: 8 mg/dL (ref 8–23)
CO2: 26 mmol/L (ref 22–32)
Calcium: 9.2 mg/dL (ref 8.9–10.3)
Chloride: 105 mmol/L (ref 98–111)
Creatinine, Ser: 0.68 mg/dL (ref 0.44–1.00)
GFR, Estimated: >60 mL/min (ref >60)
Glucose, Bld: 118 mg/dL — ABNORMAL HIGH (ref 70–99)
Potassium: 3.2 mmol/L — ABNORMAL LOW (ref 3.5–5.1)
Sodium: 138 mmol/L (ref 135–145)
Total Bilirubin: 0.5 mg/dL (ref 0.3–1.2)
Total Protein: 7.1 g/dL (ref 6.5–8.1)

## 2020-11-23 LAB — URINALYSIS, ROUTINE W REFLEX MICROSCOPIC
Bilirubin Urine: NEGATIVE
Glucose, UA: NEGATIVE mg/dL
Hgb urine dipstick: NEGATIVE
Ketones, ur: NEGATIVE mg/dL
Leukocytes,Ua: NEGATIVE
Nitrite: NEGATIVE
Protein, ur: NEGATIVE mg/dL
Specific Gravity, Urine: 1.006 (ref 1.005–1.030)
pH: 5 (ref 5.0–8.0)

## 2020-11-23 LAB — RESP PANEL BY RT-PCR (FLU A&B, COVID) ARPGX2
Influenza A by PCR: NEGATIVE
Influenza B by PCR: NEGATIVE
SARS Coronavirus 2 by RT PCR: NEGATIVE

## 2020-11-23 LAB — CBG MONITORING, ED: Glucose-Capillary: 96 mg/dL (ref 70–99)

## 2020-11-23 IMAGING — CT CT HEAD W/O CM
3 series · 15 of 47 positions shown, 18 images · non-contrast
Comparison: None.

CLINICAL DATA: Neuro deficit, acute, stroke suspected Mental status
change, unknown cause

EXAM:
CT HEAD WITHOUT CONTRAST
TECHNIQUE: Contiguous axial images were obtained from the base of the skull
through the vertex without intravenous contrast.

[Series 2: head wo · axial · 0.52mm/px · z∈[-144,-14]mm · 9 of 32 slices shown, 12 images]
[im 3/32  brain]
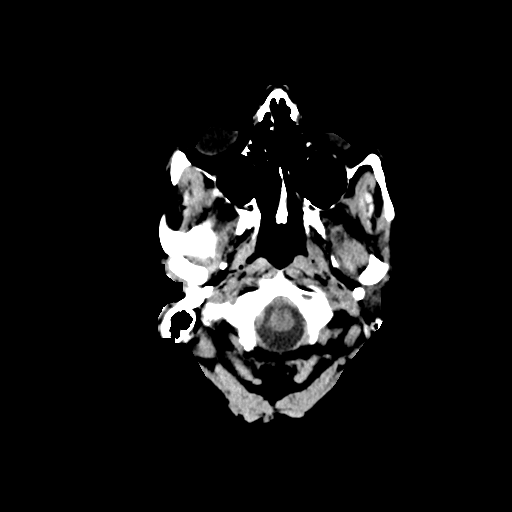
[im 3/32  bone]
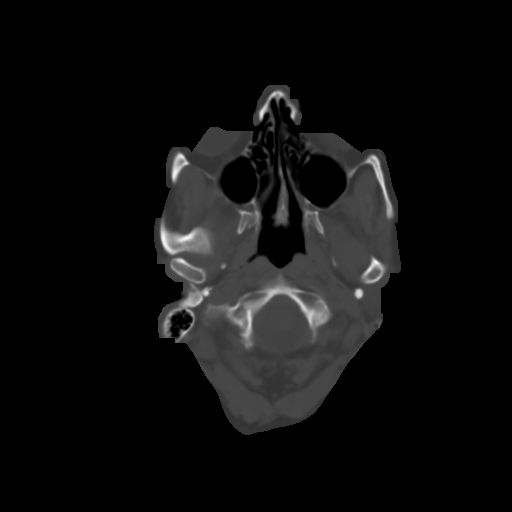
[im 6/32  brain]
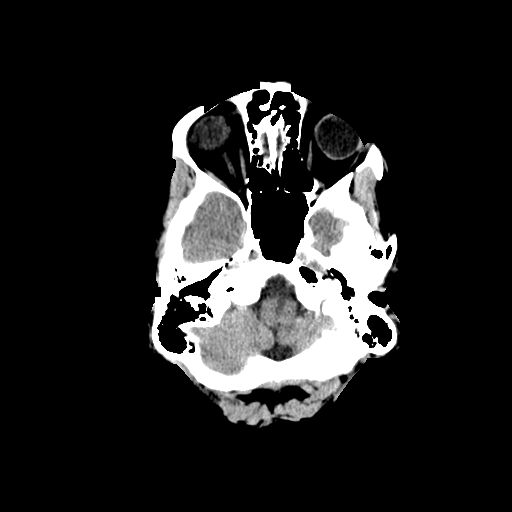
[im 9/32  brain]
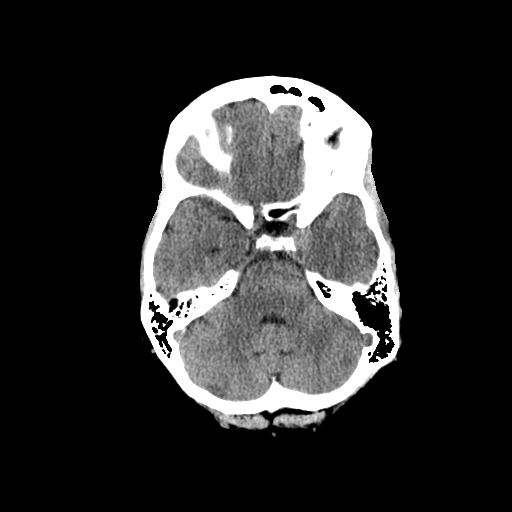
[im 12/32  brain]
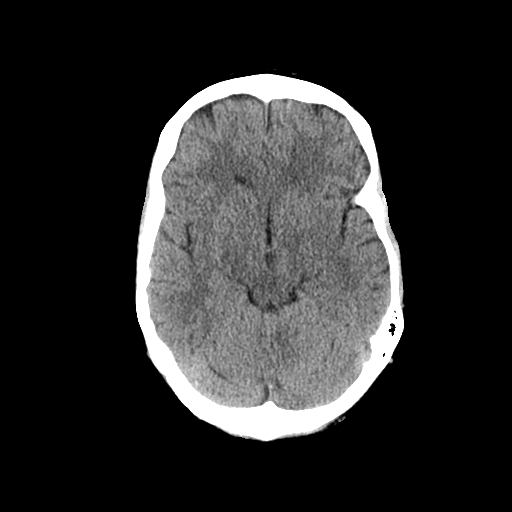
[im 17/32  brain]
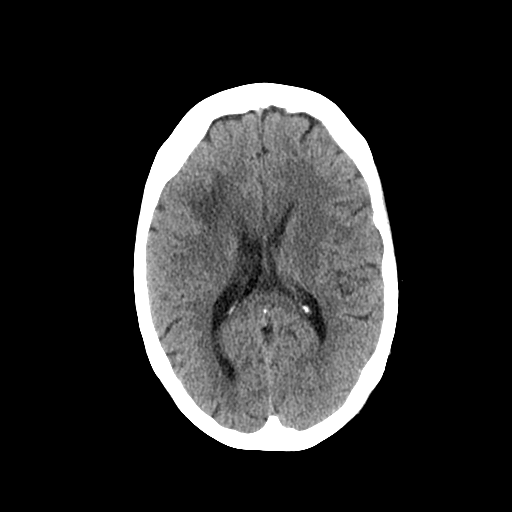
[im 17/32  bone]
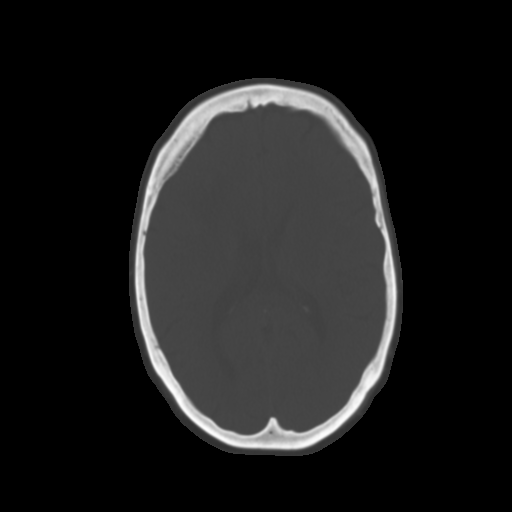
[im 20/32  brain]
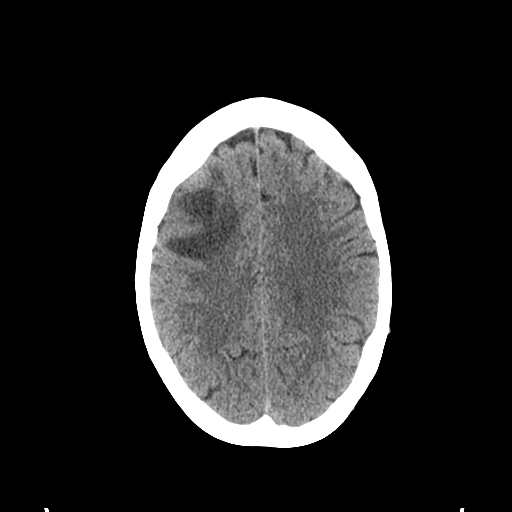
[im 23/32  brain]
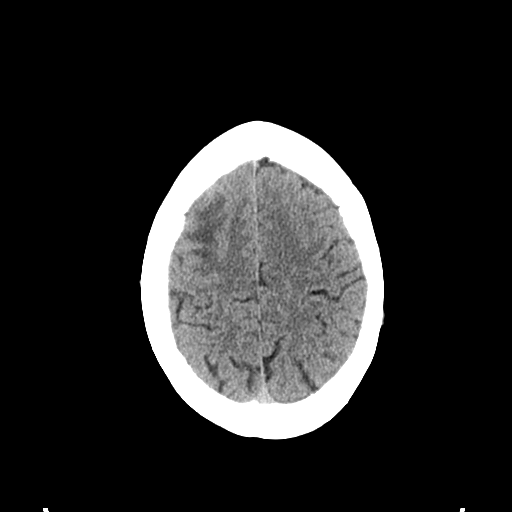
[im 26/32  brain]
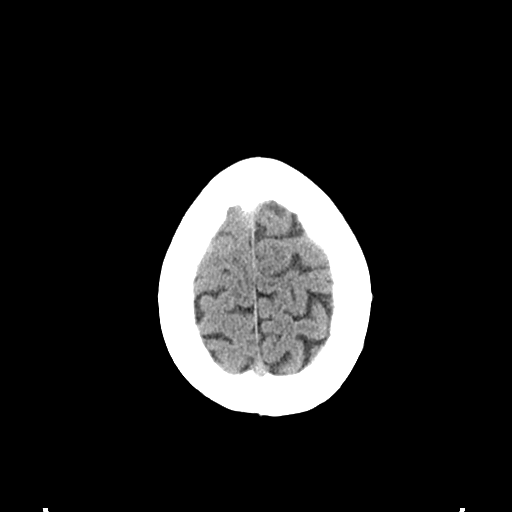
[im 29/32  brain]
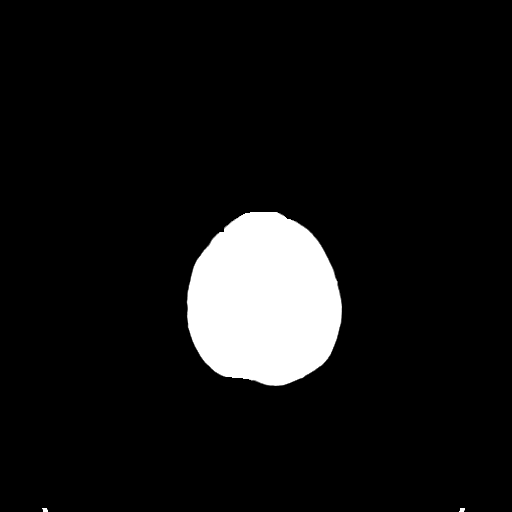
[im 29/32  bone]
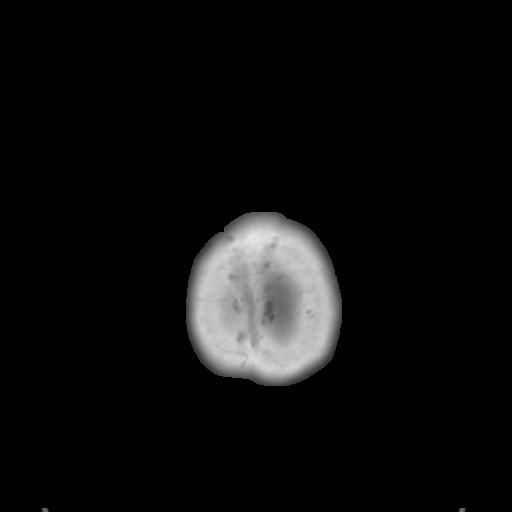

[Series 4: coronal soft tissue · coronal · 0.32mm/px · 3 of 68 slices shown]
[im 23/68  brain]
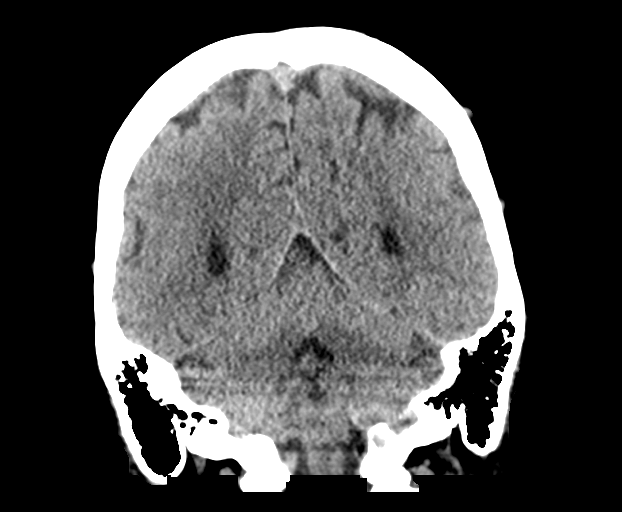
[im 30/68  brain]
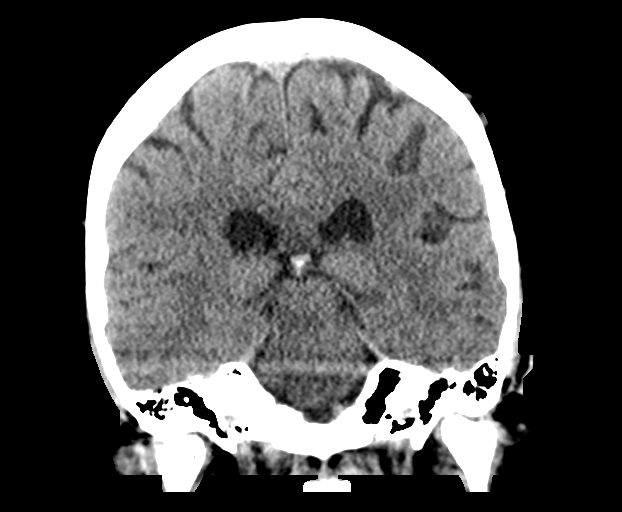
[im 38/68  brain]
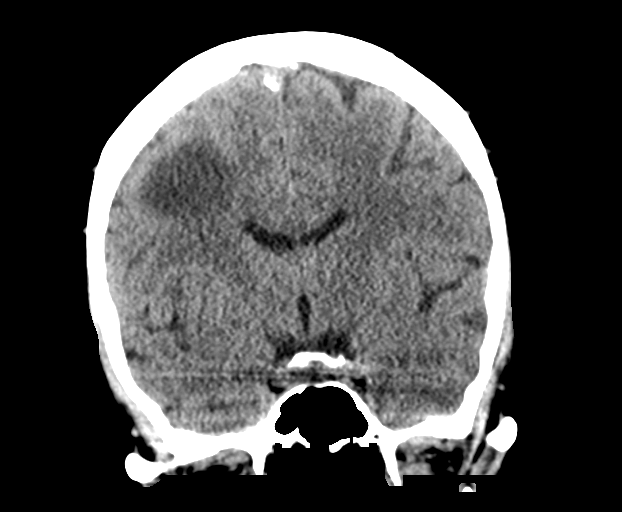

[Series 5: sagittal soft tissue · sagittal · 0.35mm/px · 3 of 49 slices shown]
[im 17/49  brain]
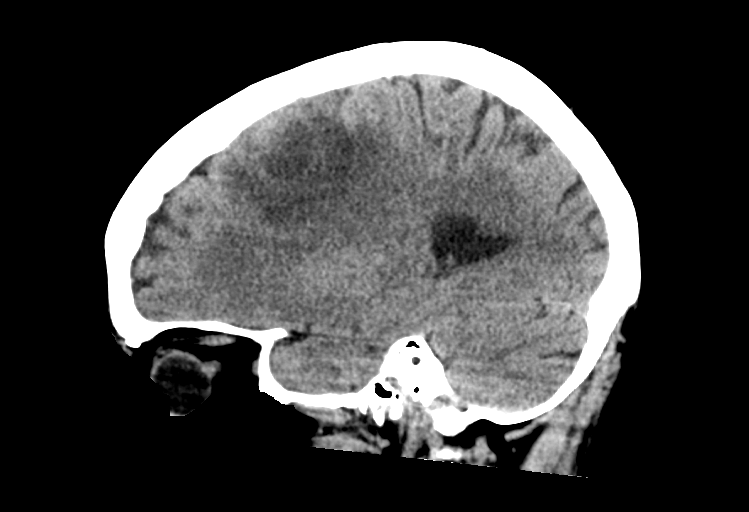
[im 25/49  brain]
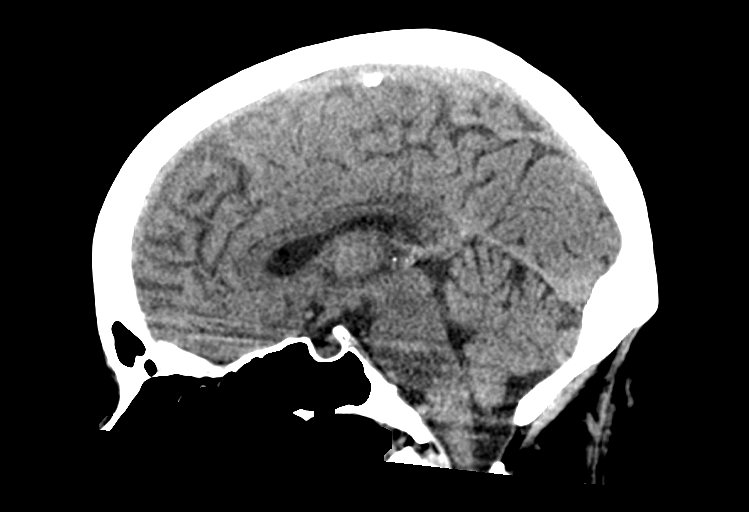
[im 33/49  brain]
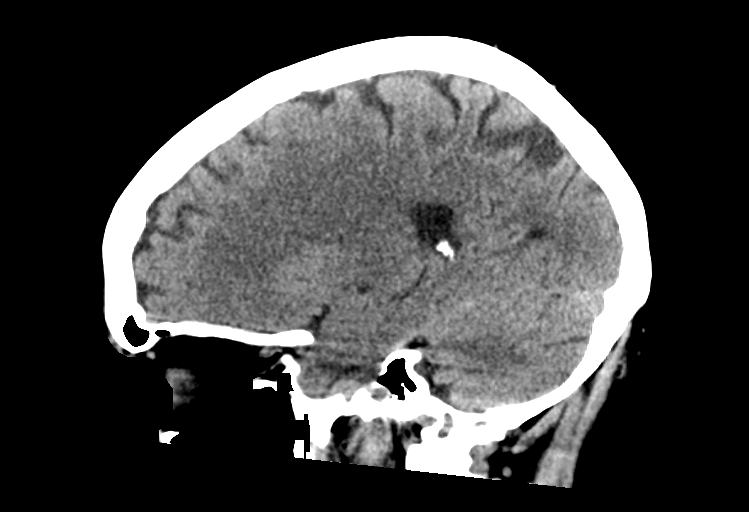

[15 of 47 positions shown; findings below may reference images not displayed]

FINDINGS: Brain: Area of low-density noted in the right frontal lobe
concerning for vasogenic edema surrounding a mass. No hemorrhage. No
midline shift. No hydrocephalus.

Vascular: No hyperdense vessel or unexpected calcification.

Skull: No acute calvarial abnormality.

Sinuses/Orbits: No acute findings

Other: None
IMPRESSION: Edema within the right frontal lobe concerning for vasogenic edema
surrounding a right frontal mass lesion. Recommend further
evaluation with MRI with and without contrast.

These results were called by telephone at the time of interpretation
on [DATE] at [DATE] to provider JORGE WLADIMIR , who verbally
acknowledged these results.

## 2020-11-23 IMAGING — CT CT CHEST-ABD-PELV W/ CM
3 of 5 series · 14 of 36 positions shown, 16 images · IV contrast (omnipaque)
Comparison: None.

CLINICAL DATA: Cancer of unknown primary. Brain mass found on head
CT.

EXAM:
CT CHEST, ABDOMEN, AND PELVIS WITH CONTRAST
TECHNIQUE: Multidetector CT imaging of the chest, abdomen and pelvis was
performed following the standard protocol during bolus
administration of intravenous contrast.
CONTRAST:  80mL OMNIPAQUE IOHEXOL 350 MG/ML SOLN

[Series 2: cap with · axial · 0.80mm/px · z∈[-641,-81]mm · 9 of 141 slices shown, 11 images]
[im 15/141  mediastinal]
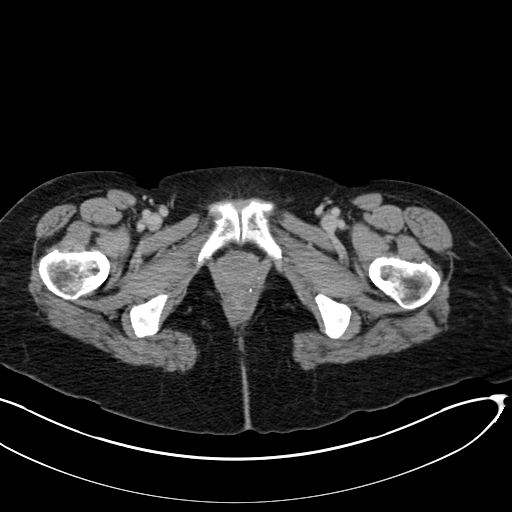
[im 15/141  bone]
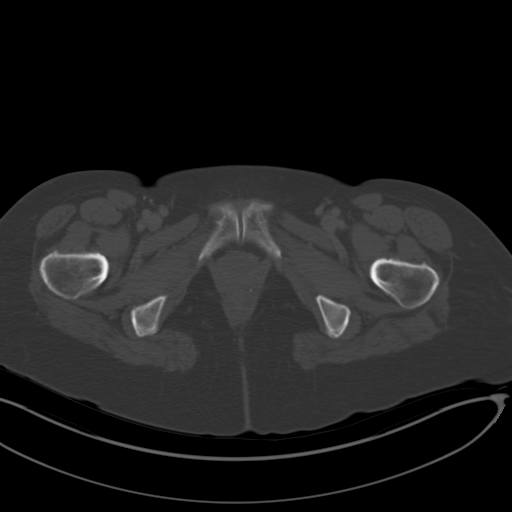
[im 29/141  mediastinal]
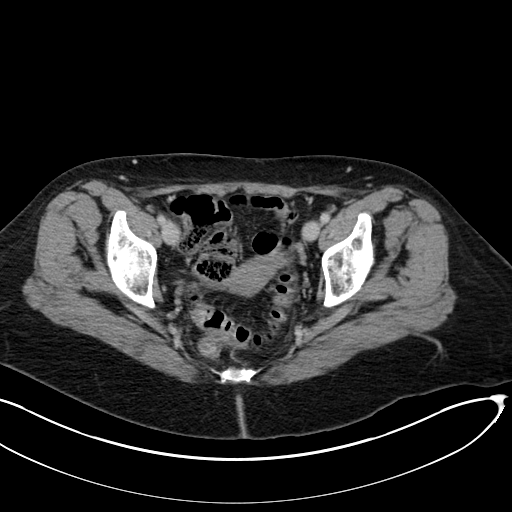
[im 43/141  mediastinal]
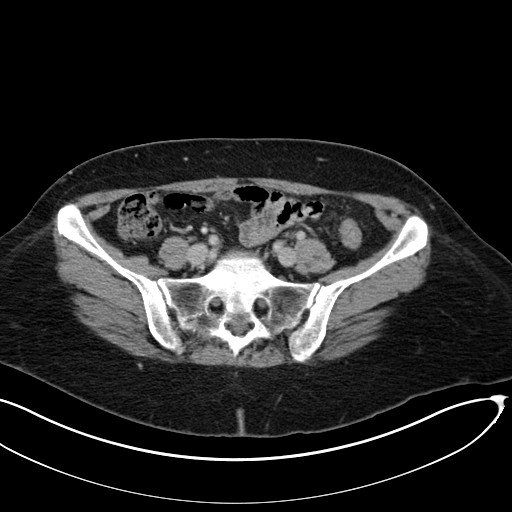
[im 57/141  mediastinal]
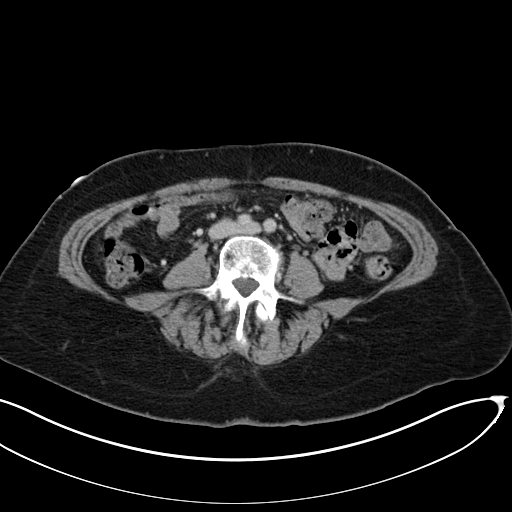
[im 71/141  mediastinal]
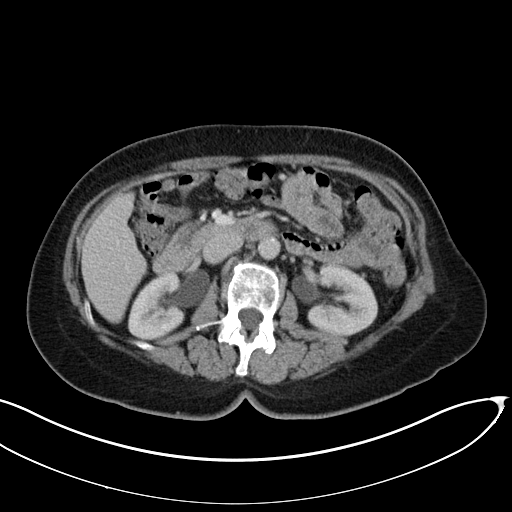
[im 85/141  mediastinal]
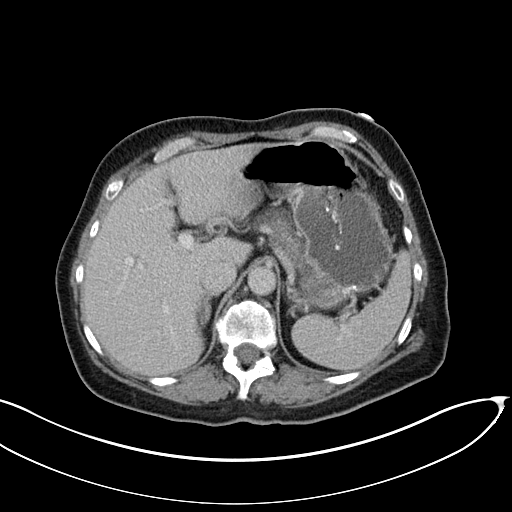
[im 99/141  mediastinal]
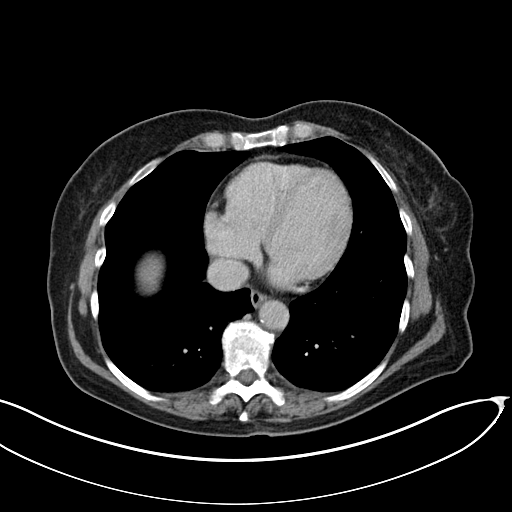
[im 113/141  mediastinal]
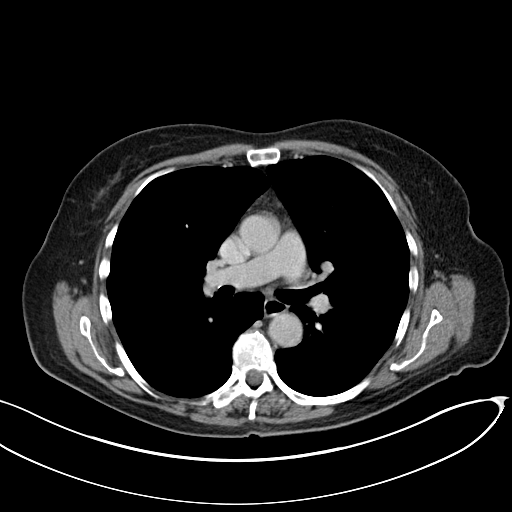
[im 127/141  mediastinal]
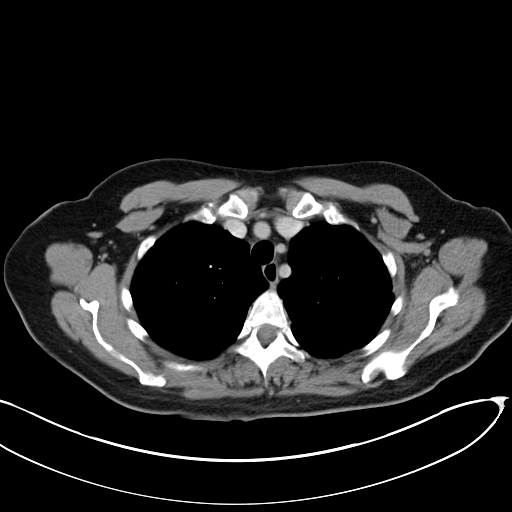
[im 127/141  bone]
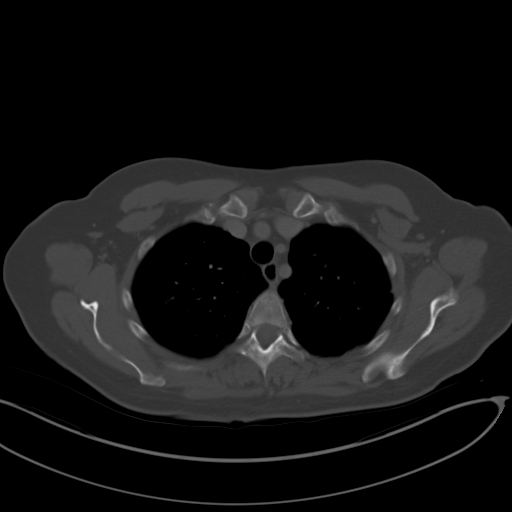

[Series 3: coronals · coronal · 0.75mm/px · 3 of 131 slices shown]
[im 27/131  mediastinal]
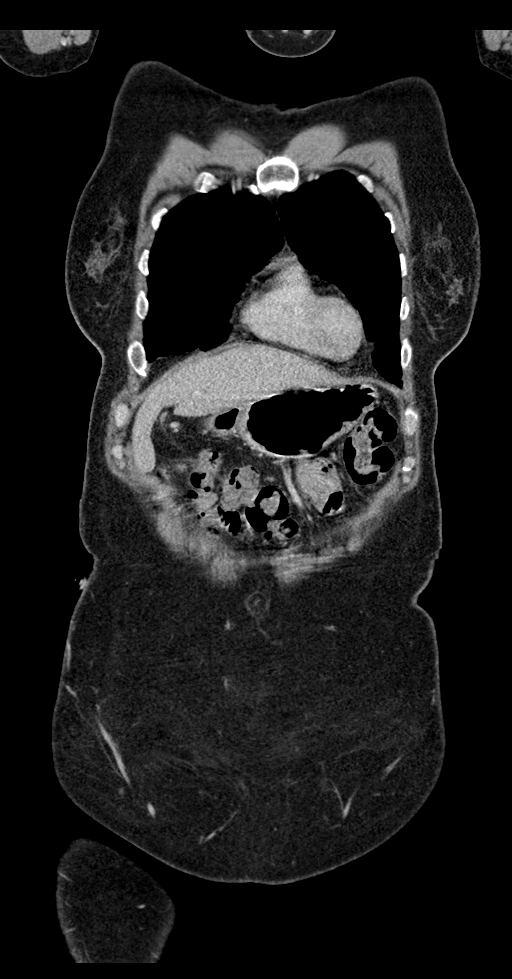
[im 53/131  mediastinal]
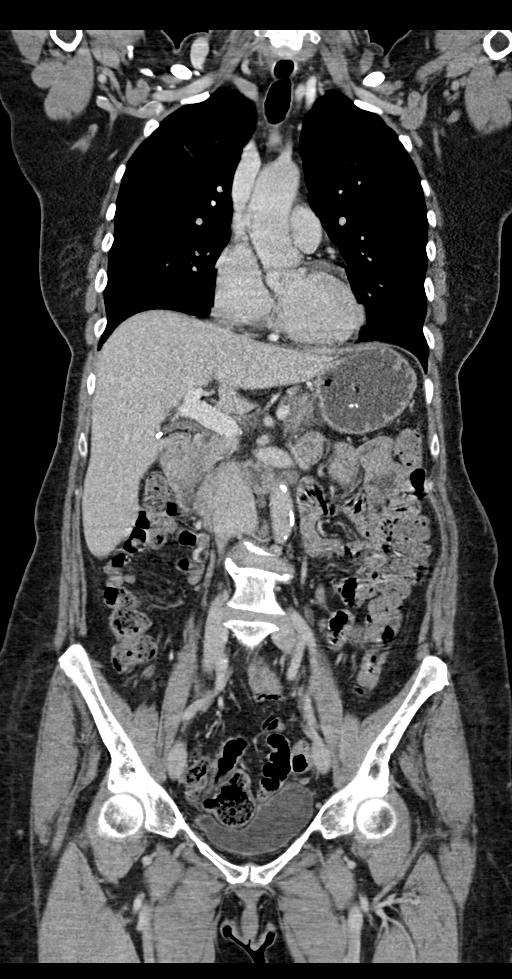
[im 79/131  mediastinal]
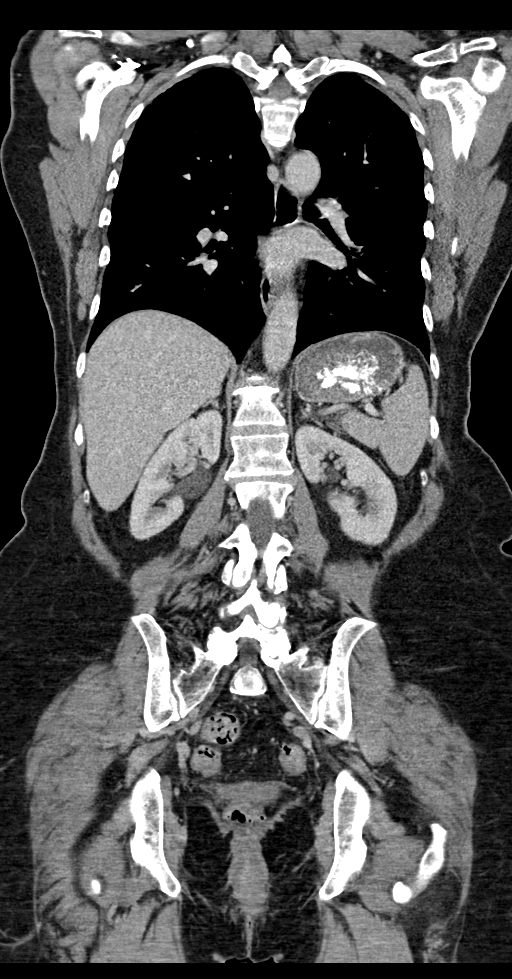

[Series 6: lung · axial · 0.80mm/px · z∈[-313,-263]mm · 2 of 164 slices shown]
[im 13/164  bone]
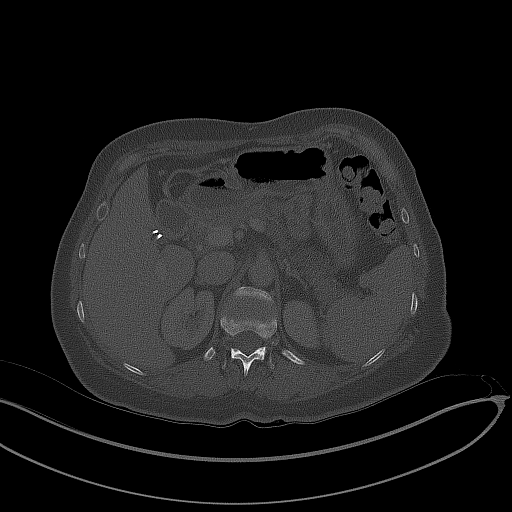
[im 38/164  bone]
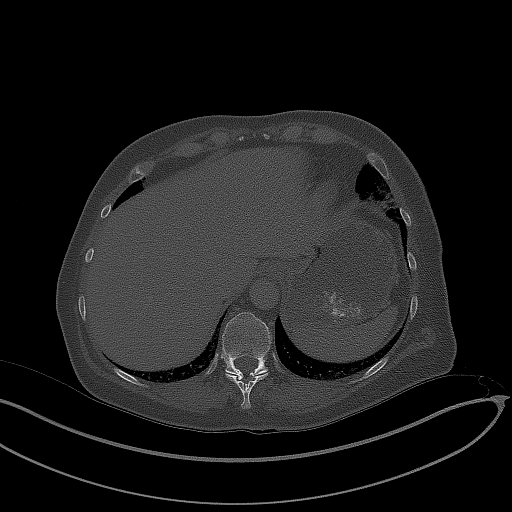

[14 of 36 positions shown; findings below may reference images not displayed]

FINDINGS: CT CHEST FINDINGS

Cardiovascular: No significant vascular findings. Normal heart size.
No pericardial effusion.

Mediastinum/Nodes: No enlarged mediastinal, hilar, or axillary lymph
nodes. Thyroid gland, trachea, and esophagus demonstrate no
significant findings.

Lungs/Pleura: There is some scarring in both lung apices. There is
minimal atelectasis in the lower lobes bilaterally. There is minimal
scarring or atelectasis in the lingula. The lungs are otherwise
clear.

Musculoskeletal: There is a single small sclerotic lesion in the T9
vertebral body measuring 5 mm. No acute fractures are seen.

CT ABDOMEN PELVIS FINDINGS

Hepatobiliary: The gallbladder is surgically absent. There is no
biliary ductal dilatation. The liver is within normal limits

Pancreas: There is questionable trace inflammatory stranding
surrounding the body of the pancreas. Pancreas is otherwise within
normal limits. There is no ductal dilatation or fluid collection.

Spleen: Normal in size without focal abnormality.

Adrenals/Urinary Tract: Adrenal glands are unremarkable. Kidneys are
normal, without renal calculi, focal lesion, or hydronephrosis.
Bladder is unremarkable.

Stomach/Bowel: Stomach is within normal limits. Appendix appears
normal. No evidence of bowel wall thickening, distention, or
inflammatory changes.

Vascular/Lymphatic: Aortic atherosclerosis. No enlarged abdominal or
pelvic lymph nodes.

Reproductive: Uterus and bilateral adnexa are unremarkable.

Other: There is a small fat containing umbilical hernia. There is no
ascites or free air.

Musculoskeletal: There is an 8 mm sclerotic density in the left
iliac wing, indeterminate. No fractures are identified.
IMPRESSION: 1. Questionable mild fat stranding surrounding the pancreatic body.
Please correlate clinically for mild acute pancreatitis.
2. There are 2 small indeterminate sclerotic osseous lesions
(thoracic spine and left iliac wing).
3. No acute cardiopulmonary process.

## 2020-11-23 IMAGING — MR MR HEAD WO/W CM
15 series · 48 of 48 positions shown · IV contrast (gadavist)
Comparison: Head CT same day

CLINICAL DATA: Abnormal head CT.  Right frontal mass.

EXAM:
MRI HEAD WITHOUT AND WITH CONTRAST
TECHNIQUE: Multiplanar, multiecho pulse sequences of the brain and surrounding
structures were obtained without and with intravenous contrast.
CONTRAST:  8.5mL GADAVIST GADOBUTROL 1 MMOL/ML IV SOLN

[Series 5: DWI · axial · 3.0mm · 1.25mm/px · z∈[-66,+72]mm · 4 of 96 slices shown (1 of 2)]
[im 1/96]
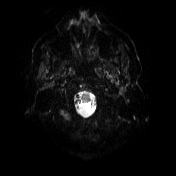
[im 32/96]
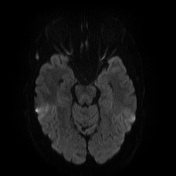
[im 64/96]
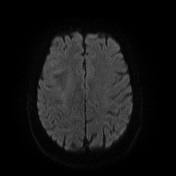
[im 96/96]
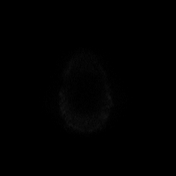

[Series 6: DWI · axial · 3.0mm · 1.25mm/px · z∈[-66,+72]mm · 3 of 48 slices shown (2 of 2)]
[im 1/48]
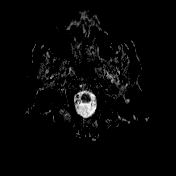
[im 24/48]
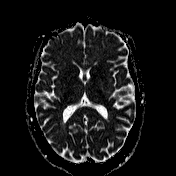
[im 48/48]
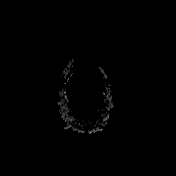

[Series 7: T1 · sagittal · 5.0mm · 0.72mm/px · 1 of 24 slices shown (1 of 4)]
[im 1/24]
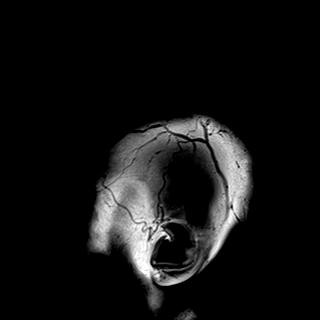

[Series 8: T2 · axial · 5.0mm · 0.60mm/px · 1 of 26 slices shown]
[im 1/26]
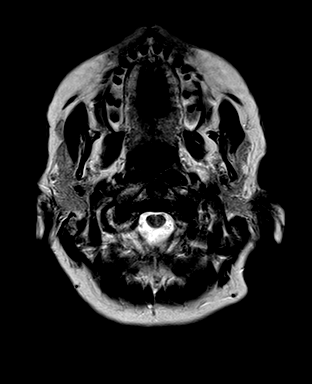

[Series 9: swi_images · axial · 3.0mm · 0.72mm/px · z∈[-79,+94]mm · 3 of 60 slices shown]
[im 1/60]
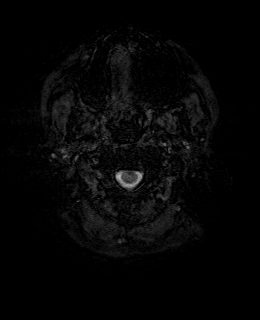
[im 30/60]
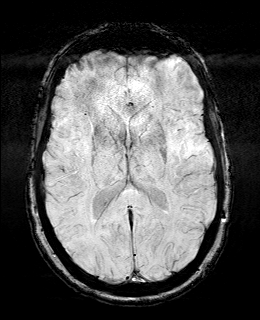
[im 60/60]
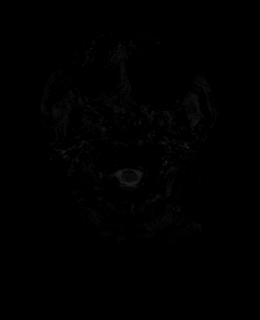

[Series 11: FLAIR · axial · 3.0mm · 0.72mm/px · z∈[-68,+83]mm · 3 of 52 slices shown]
[im 1/52]
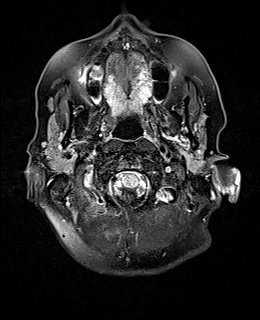
[im 26/52]
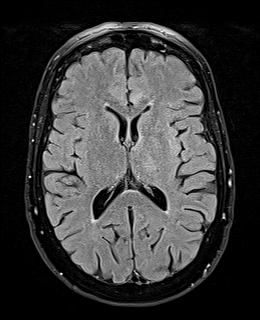
[im 52/52]
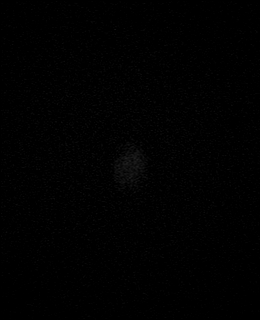

[Series 12: T1 · axial · 1.0mm · 0.94mm/px · z∈[-76,+80]mm · 9 of 160 slices shown (2 of 4)]
[im 1/160]
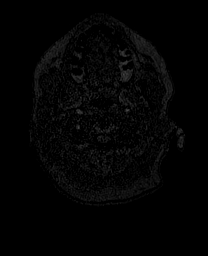
[im 20/160]
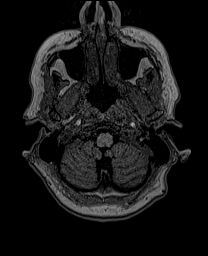
[im 40/160]
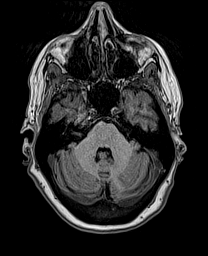
[im 60/160]
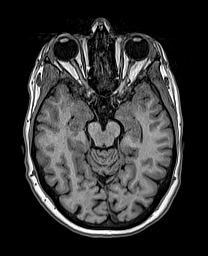
[im 80/160]
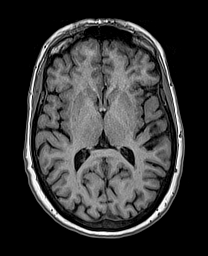
[im 100/160]
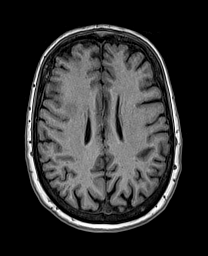
[im 120/160]
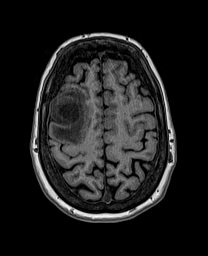
[im 140/160]
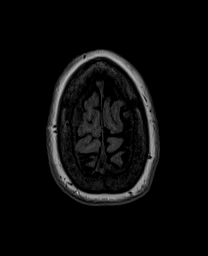
[im 160/160]
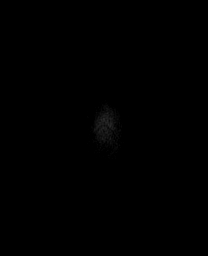

[Series 13: cor dwi_tracew · coronal · 5.0mm · 1.53mm/px · 3 of 56 slices shown]
[im 1/56]
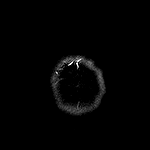
[im 28/56]
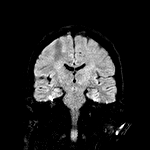
[im 56/56]
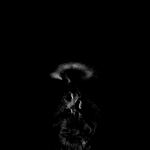

[Series 14: cor dwi_adc · coronal · 5.0mm · 1.53mm/px · 2 of 28 slices shown]
[im 1/28]
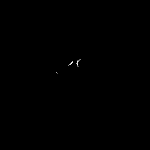
[im 28/28]
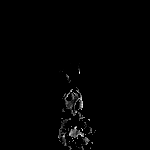

[Series 15: T2 post-contrast · coronal · 5.0mm · 0.57mm/px · 2 of 28 slices shown]
[im 1/28]
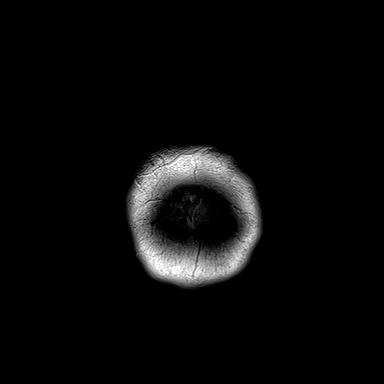
[im 28/28]
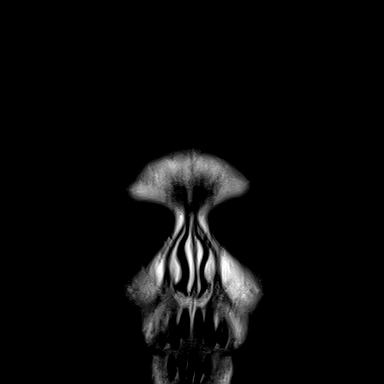

[Series 16: T1 post-contrast · axial · 1.0mm · 0.94mm/px · z∈[-76,+80]mm · 9 of 160 slices shown (1 of 3)]
[im 1/160]
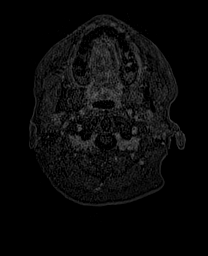
[im 20/160]
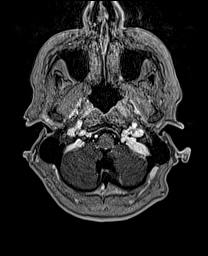
[im 40/160]
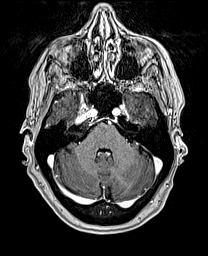
[im 60/160]
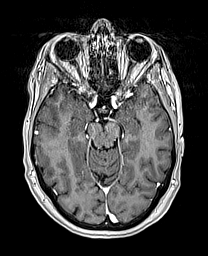
[im 80/160]
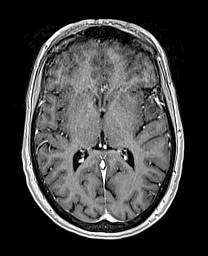
[im 100/160]
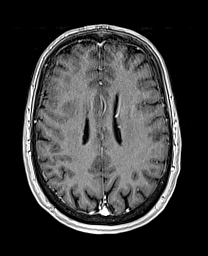
[im 120/160]
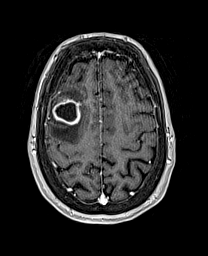
[im 140/160]
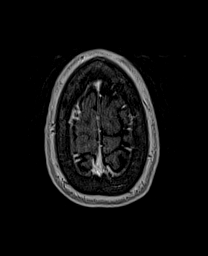
[im 160/160]
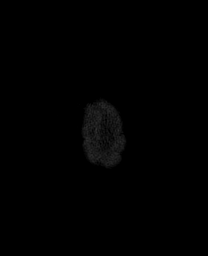

[Series 17: T1 · sagittal · 4.0mm · 0.94mm/px · 2 of 34 slices shown (3 of 4)]
[im 1/34]
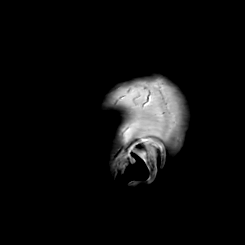
[im 34/34]
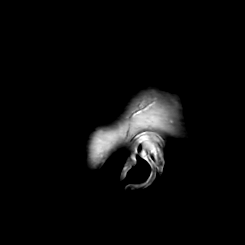

[Series 18: T1 · coronal · 4.0mm · 0.94mm/px · 3 of 47 slices shown (4 of 4)]
[im 1/47]
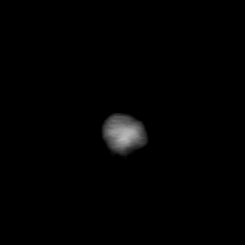
[im 24/47]
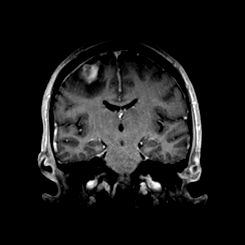
[im 47/47]
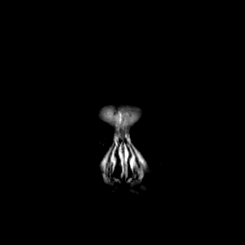

[Series 19: T1 post-contrast · coronal · 5.0mm · 0.43mm/px · 2 of 28 slices shown (2 of 3)]
[im 1/28]
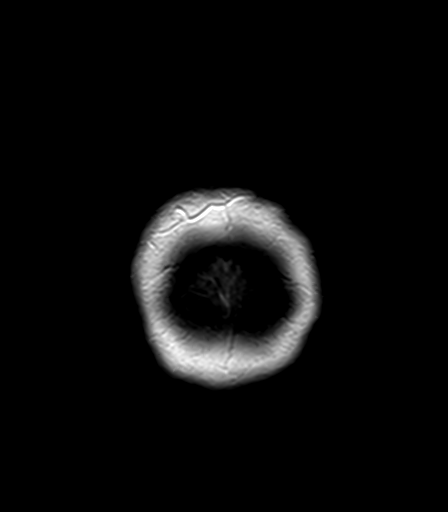
[im 28/28]
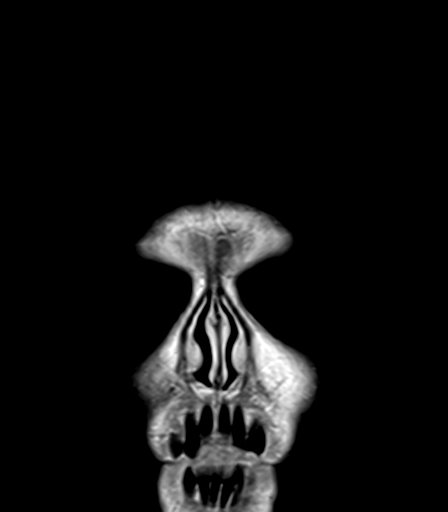

[Series 20: T1 post-contrast · sagittal · 5.0mm · 0.72mm/px · 1 of 24 slices shown (3 of 3)]
[im 1/24]
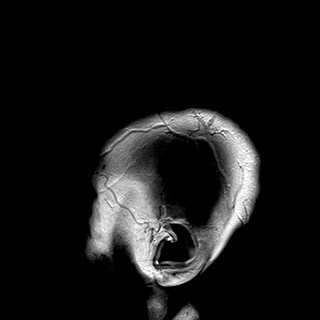

[48 of 48 positions shown; findings below may reference images not displayed]

FINDINGS: Brain: There is an up to 3 cm in diameter necrotic peripheral
intra-axial mass in the right posterior frontal lobe with
surrounding vasogenic edema. The lesion extends to the surface of
the brain. Most likely diagnosis is necrotic brain tumor, either
primary or metastatic. Brain abscess is considered but the internal
characteristics are not entirely typical. No midline shift or
threatening herniation. Otherwise, the brain is normal without
second lesion, ischemic change, hydrocephalus or extra-axial
collection.

Vascular: Major vessels at the base of the brain show flow.

Skull and upper cervical spine: Negative

Sinuses/Orbits: Clear/normal

Other: None
IMPRESSION: Approximately 3 cm in diameter necrotic lesion in the posterior
right frontal lobe with surrounding vasogenic edema, most consistent
with malignant brain tumor, either primary or metastatic. No second
lesion is seen. The possibility of brain abscess was considered, but
the internal characteristics are not typical of that diagnosis.

## 2020-11-23 MED ORDER — DEXAMETHASONE SODIUM PHOSPHATE 4 MG/ML IJ SOLN
4.0000 mg | Freq: Three times a day (TID) | INTRAMUSCULAR | Status: DC
  Administered 2020-11-23 – 2020-11-25 (×6): 4 mg via INTRAVENOUS
  Filled 2020-11-23 (×6): qty 1

## 2020-11-23 MED ORDER — LORAZEPAM 2 MG/ML IJ SOLN
1.0000 mg | Freq: Once | INTRAMUSCULAR | Status: AC | PRN
  Administered 2020-11-23: 1 mg via INTRAVENOUS
  Filled 2020-11-23: qty 1

## 2020-11-23 MED ORDER — GADOBUTROL 1 MMOL/ML IV SOLN
8.5000 mL | Freq: Once | INTRAVENOUS | Status: AC | PRN
  Administered 2020-11-23: 8.5 mL via INTRAVENOUS

## 2020-11-23 MED ORDER — LEVETIRACETAM IN NACL 1000 MG/100ML IV SOLN
1000.0000 mg | Freq: Once | INTRAVENOUS | Status: AC
  Administered 2020-11-23: 1000 mg via INTRAVENOUS
  Filled 2020-11-23: qty 100

## 2020-11-23 MED ORDER — IOHEXOL 350 MG/ML SOLN
80.0000 mL | Freq: Once | INTRAVENOUS | Status: AC | PRN
  Administered 2020-11-23: 80 mL via INTRAVENOUS

## 2020-11-23 MED ORDER — POTASSIUM CHLORIDE CRYS ER 20 MEQ PO TBCR
40.0000 meq | EXTENDED_RELEASE_TABLET | Freq: Once | ORAL | Status: AC
  Administered 2020-11-23: 40 meq via ORAL
  Filled 2020-11-23: qty 2

## 2020-11-23 NOTE — H&P (Signed)
Erika Mitchell XTG:[id number] DOB: 11-10-1955 DOA: 11/23/2020     PCP: Ginger Organ., MD   Outpatient Specialists:    Patient arrived to ER on 11/23/20 at 8 Referred by Attending Toy Baker, MD   Patient coming from: home Lives With family    Chief Complaint:   Chief Complaint  Patient presents with   Altered Mental Status   Dizziness    HPI: Erika Mitchell is a 66 y.o. female with medical history significant of  vestibular neuritis.   HTN , vertigo  Presented with periods of confusion would go in a room and not know why, forgot how to type or use her phone, trouble with word finding,  Not similar to prior hx of vertigo Yesterday it seemed that  She was on the phone today and realized she could not talk that scared her and she  This persisted until 3:30 PM when she arrived to ER  She is also feeling like her head is turning to the left against her will Reports her sister died from [place] a little while ago Has  been vaccinated against COVID and boosted   Initial COVID TEST  NEGATIVE   Lab Results  Component Value Date   [place] NEGATIVE 11/23/2020     Regarding pertinent Chronic problems:    Vertigo - neuritis on xanax While in ER: CT showed  shows edema and concern for mass MRI confirmed brain mass Neurology was consulted recommended loading with Keppra 1000 mg neurosurgical evaluation as well as consult with Dr. Mickeal Skinner in the morning. CT abdomen chest was done for malignancy screening Neurosurgery has also been consulted Plan for surgical intervention on Wednesday meanwhile start on Decadron for milligrams every 8 CT imaging Questionable mild fat stranding surrounding the pancreatic body.There are 2 small indeterminate sclerotic osseous lesions  ED Triage Vitals  Enc Vitals Group     BP 11/23/20 1546 (!) 193/80     Pulse Rate 11/23/20 1546 68     Resp 11/23/20 1546 16     Temp 11/23/20 1546 (!) 97.5 F (36.4 C)     Temp Source  11/23/20 1546 Oral     SpO2 11/23/20 1546 99 %     Weight 11/23/20 1547 175 lb (79.4 kg)     Height 11/23/20 1547 5\' 7"  (1.702 m)     Head Circumference --      Peak Flow --      Pain Score 11/23/20 1943 0     Pain Loc --      Pain Edu? --      Excl. in [place]? --   TMAX(24)@     _________________________________________ Significant initial  Findings: Abnormal Labs Reviewed  COMPREHENSIVE METABOLIC PANEL - Abnormal; Notable for the following components:      Result Value   Potassium 3.2 (*)    Glucose, Bld 118 (*)    All other components within normal limits  URINALYSIS, ROUTINE W REFLEX MICROSCOPIC - Abnormal; Notable for the following components:   Color, Urine STRAW (*)    All other components within normal limits   ____________________________________________ Ordered CT HEAD Edema within the right frontal lobe concerning for vasogenic edema   CT  chest -  CTabd/pelvis - Questionable mild fat stranding surrounding the pancreatic body There are 2 small indeterminate sclerotic osseous lesions     ECG: Ordered Personally reviewed by me showing: HR : 67 Rhythm:  NSR   no evidence of ischemic changes QTC 441  The recent clinical data is shown below. Vitals:   11/23/20 1715 11/23/20 1930 11/23/20 1933 11/23/20 2015  BP: (!) 150/77 (!) 141/61 (!) 141/61 128/74  Pulse: 64 66 75 69  Resp: (!) 25  18 18   Temp:      TempSrc:      SpO2: 97% 97% 98% 96%  Weight:      Height:         WBC     Component Value Date/Time   WBC 4.1 11/23/2020 1608   LYMPHSABS 1.4 11/23/2020 1608   MONOABS 0.3 11/23/2020 1608   EOSABS 0.0 11/23/2020 1608   BASOSABS 0.0 11/23/2020 1608      UA  no evidence of UTI      Urine analysis:    Component Value Date/Time   COLORURINE STRAW (A) 11/23/2020 1652   APPEARANCEUR CLEAR 11/23/2020 1652   LABSPEC 1.006 11/23/2020 1652   PHURINE 5.0 11/23/2020 [place] 11/23/2020 1652   HGBUR NEGATIVE 11/23/2020 1652   [place] 11/23/2020 1652   [place] 11/23/2020 1652   PROTEINUR NEGATIVE 11/23/2020 1652   NITRITE NEGATIVE 11/23/2020 1652   LEUKOCYTESUR NEGATIVE 11/23/2020 1652    Results for orders placed or performed during the hospital encounter of 11/23/20  Resp Panel by RT-PCR (Flu A&B, Covid) Nasopharyngeal Swab     Status: None   Collection Time: 11/23/20  7:51 PM   Specimen: Nasopharyngeal Swab; Nasopharyngeal(NP) swabs in vial transport medium  Result Value Ref Range Status   SARS Coronavirus 2 by RT PCR NEGATIVE NEGATIVE Final         Influenza A by PCR NEGATIVE NEGATIVE Final   Influenza B by PCR NEGATIVE NEGATIVE Final           _______________________________________________________ ER Provider Called:  Neurology   Dr.Bhagat They Recommend admit to medicine  Keppra load Will see on arrival ______________________________  ER Provider Called:  Neurosurgery  Dr.Ostergard They Recommend admit to medicine  , decadrone 4 mg q8h Will see on arrival   Hospitalist was called for admission for brain mass  The following Work up has been ordered so far:  Orders Placed This Encounter  Procedures   Resp Panel by RT-PCR (Flu A&B, Covid) Nasopharyngeal Swab   CT HEAD WO CONTRAST (5MM)   MR Brain W and Wo Contrast   CT CHEST ABDOMEN PELVIS W CONTRAST   Comprehensive metabolic panel   Urinalysis, Routine w reflex microscopic Urine, Clean Catch   CBC with Differential   Document Height and Actual Weight   Neuro checks q 2 hours x12 hours   Cardiac monitoring   Cardiac monitoring   Consult to neurosurgery   Consult to hospitalist   CBG monitoring, ED   ED EKG   EKG 12-Lead   Saline lock IV   Admit to Inpatient (patient's expected length of stay will be greater than 2 midnights or inpatient only procedure)     Following Medications were ordered in ER: Medications  dexamethasone (DECADRON) injection 4 mg (has no administration in time range)  LORazepam (ATIVAN) injection  1 mg (1 mg Intravenous Given 11/23/20 1733)  levETIRAcetam (KEPPRA) IVPB 1000 mg/100 mL premix (0 mg Intravenous Stopped 11/23/20 1900)  potassium chloride SA (KLOR-CON) CR tablet 40 mEq (40 mEq Oral Given 11/23/20 1738)  iohexol (OMNIPAQUE) 350 MG/ML injection 80 mL (80 mLs Intravenous Contrast Given 11/23/20 1746)  gadobutrol (GADAVIST) 1 MMOL/ML injection 8.5 mL (8.5 mLs Intravenous Contrast Given 11/23/20 1819)  Consult Orders  (From admission, onward)           Start     Ordered   11/23/20 2022  Consult to hospitalist  Once       Provider:  (Not yet assigned)  Question Answer Comment  Place call to: [facility]   Reason for Consult Admit      11/23/20 2021              OTHER Significant initial  Findings:  labs showing:    Recent Labs  Lab 11/23/20 1609  NA 138  K 3.2*  CO2 26  GLUCOSE 118*  BUN 8  CREATININE 0.68  CALCIUM 9.2    Cr    stable,    Lab Results  Component Value Date   CREATININE 0.68 11/23/2020    Recent Labs  Lab 11/23/20 1609  AST 23  ALT 25  ALKPHOS 58  BILITOT 0.5  PROT 7.1  ALBUMIN 4.6   Lab Results  Component Value Date   CALCIUM 9.2 11/23/2020       Plt: Lab Results  Component Value Date   PLT 184 11/23/2020     Recent Labs  Lab 11/23/20 1608  WBC 4.1  NEUTROABS 2.3  HGB 15.0  HCT 44.4  MCV 95.5  PLT 184    HG/HCT  stable,      Component Value Date/Time   HGB 15.0 11/23/2020 1608   HCT 44.4 11/23/2020 1608   MCV 95.5 11/23/2020 1608    CBG (last 3)  Recent Labs    11/23/20 1658  GLUCAP 96          Cultures: No results found for: SDES, SPECREQUEST, CULT, REPTSTATUS   Radiological Exams on Admission: CT HEAD WO CONTRAST (5MM)  Result Date: 11/23/2020 CLINICAL DATA:  Neuro deficit, acute, stroke suspected Mental status change, unknown cause EXAM: CT HEAD WITHOUT CONTRAST TECHNIQUE: Contiguous axial images were obtained from the base of the skull through the vertex without  intravenous contrast. COMPARISON:  None. FINDINGS: Brain: Area of low-density noted in the right frontal lobe concerning for vasogenic edema surrounding a mass. No hemorrhage. No midline shift. No hydrocephalus. Vascular: No hyperdense vessel or unexpected calcification. Skull: No acute calvarial abnormality. Sinuses/Orbits: No acute findings Other: None IMPRESSION: Edema within the right frontal lobe concerning for vasogenic edema surrounding a right frontal mass lesion. Recommend further evaluation with MRI with and without contrast. These results were called by telephone at the time of interpretation on 11/23/2020 at 5:05 pm to provider Sherwood Gambler , who verbally acknowledged these results. Electronically Signed   By: Rolm Baptise M.D.   On: 11/23/2020 17:09   MR Brain W and Wo Contrast  Result Date: 11/23/2020 CLINICAL DATA:  Abnormal head CT.  Right frontal mass. EXAM: MRI HEAD WITHOUT AND WITH CONTRAST TECHNIQUE: Multiplanar, multiecho pulse sequences of the brain and surrounding structures were obtained without and with intravenous contrast. CONTRAST:  8.5mL GADAVIST GADOBUTROL 1 MMOL/ML IV SOLN COMPARISON:  Head CT same day FINDINGS: Brain: There is an up to 3 cm in diameter necrotic peripheral intra-axial mass in the right posterior frontal lobe with surrounding vasogenic edema. The lesion extends to the surface of the brain. Most likely diagnosis is necrotic brain tumor, either primary or metastatic. Brain abscess is considered but the internal characteristics are not entirely typical. No midline shift or threatening herniation. Otherwise, the brain is normal without second lesion, ischemic change, hydrocephalus or extra-axial collection. Vascular: Major vessels at the  base of the brain show flow. Skull and upper cervical spine: Negative Sinuses/Orbits: Clear/normal Other: None IMPRESSION: Approximately 3 cm in diameter necrotic lesion in the posterior right frontal lobe with surrounding vasogenic  edema, most consistent with malignant brain tumor, either primary or metastatic. No second lesion is seen. The possibility of brain abscess was considered, but the internal characteristics are not typical of that diagnosis. Electronically Signed   By: Nelson Chimes M.D.   On: 11/23/2020 19:25   CT CHEST ABDOMEN PELVIS W CONTRAST  Result Date: 11/23/2020 CLINICAL DATA:  Cancer of unknown primary. Brain mass found on head CT. EXAM: CT CHEST, ABDOMEN, AND PELVIS WITH CONTRAST TECHNIQUE: Multidetector CT imaging of the chest, abdomen and pelvis was performed following the standard protocol during bolus administration of intravenous contrast. CONTRAST:  83mL OMNIPAQUE IOHEXOL 350 MG/ML SOLN COMPARISON:  None. FINDINGS: CT CHEST FINDINGS Cardiovascular: No significant vascular findings. Normal heart size. No pericardial effusion. Mediastinum/Nodes: No enlarged mediastinal, hilar, or axillary lymph nodes. Thyroid gland, trachea, and esophagus demonstrate no significant findings. Lungs/Pleura: There is some scarring in both lung apices. There is minimal atelectasis in the lower lobes bilaterally. There is minimal scarring or atelectasis in the lingula. The lungs are otherwise clear. Musculoskeletal: There is a single small sclerotic lesion in the T9 vertebral body measuring 5 mm. No acute fractures are seen. CT ABDOMEN PELVIS FINDINGS Hepatobiliary: The gallbladder is surgically absent. There is no biliary ductal dilatation. The liver is within normal limits Pancreas: There is questionable trace inflammatory stranding surrounding the body of the pancreas. Pancreas is otherwise within normal limits. There is no ductal dilatation or fluid collection. Spleen: Normal in size without focal abnormality. Adrenals/Urinary Tract: Adrenal glands are unremarkable. Kidneys are normal, without renal calculi, focal lesion, or hydronephrosis. Bladder is unremarkable. Stomach/Bowel: Stomach is within normal limits. Appendix appears  normal. No evidence of bowel wall thickening, distention, or inflammatory changes. Vascular/Lymphatic: Aortic atherosclerosis. No enlarged abdominal or pelvic lymph nodes. Reproductive: Uterus and bilateral adnexa are unremarkable. Other: There is a small fat containing umbilical hernia. There is no ascites or free air. Musculoskeletal: There is an 8 mm sclerotic density in the left iliac wing, indeterminate. No fractures are identified. IMPRESSION: 1. Questionable mild fat stranding surrounding the pancreatic body. Please correlate clinically for mild acute pancreatitis. 2. There are 2 small indeterminate sclerotic osseous lesions (thoracic spine and left iliac wing). 3. No acute cardiopulmonary process. Electronically Signed   By: Ronney Asters M.D.   On: 11/23/2020 18:14   _______________________________________________________________________________________________________ Latest   Blood pressure 128/74, pulse 69, temperature (!) 97.5 F (36.4 C), temperature source Oral, resp. rate 18, height 5\' 7"  (1.702 m), weight 79.4 kg, SpO2 96 %.   Review of Systems:    Pertinent positives include:  localizing neurological complaints, confusion word finding difficulty  Constitutional:  No weight loss, night sweats, Fevers, chills, fatigue, weight loss  HEENT:  No headaches, Difficulty swallowing,Tooth/dental problems,Sore throat,  No sneezing, itching, ear ache, nasal congestion, post nasal drip,  Cardio-vascular:  No chest pain, Orthopnea, PND, anasarca, dizziness, palpitations.no Bilateral lower extremity swelling  GI:  No heartburn, indigestion, abdominal pain, nausea, vomiting, diarrhea, change in bowel habits, loss of appetite, melena, blood in stool, hematemesis Resp:  no shortness of breath at rest. No dyspnea on exertion, No excess mucus, no productive cough, No non-productive cough, No coughing up of blood.No change in color of mucus.No wheezing. Skin:  no rash or lesions. No jaundice GU:   no dysuria, change  in color of urine, no urgency or frequency. No straining to urinate.  No flank pain.  Musculoskeletal:  No joint pain or no joint swelling. No decreased range of motion. No back pain.  Psych:  No change in mood or affect. No depression or anxiety. No memory loss.  Neuro: no , no tingling, no weakness, no double vision, no gait abnormality, no slurred speech, no confusion  All systems reviewed and apart from [place] all are negative _______________________________________________________________________________________________ Past Medical History:   Past Medical History:  Diagnosis Date   Cancer ([place])    basal cell carcinoma on left arm left eye brow   Hypertension    Vertigo    Vestibular neuritis    right ear      Past Surgical History:  Procedure Laterality Date   CHOLECYSTECTOMY  2017   laproscopy     surgery for endometriosisi   OOPHORECTOMY  1974&1990   rt then left ovary    Social History:  Ambulatory   independently       reports that she quit smoking about 24 years ago. Her smoking use included cigarettes. She has never used smokeless tobacco. She reports current alcohol use of about 2.0 standard drinks per week. She reports that she does not use drugs.     Family History:   Family History  Problem Relation Age of Onset   Lung cancer Mother    Aneurysm Father    Heart disease Sister    Atrial fibrillation Brother    Colon cancer Neg Hx    ______________________________________________________________________________________________ Allergies: Allergies  Allergen Reactions   Epinephrine Other (See Comments)    "dizziness, rapid heartbeat"   Azithromycin     dizziness   Motrin [Ibuprofen]     dizziness     Prior to Admission medications   Medication Sig Start Date End Date Taking? Authorizing Provider  acetaminophen (TYLENOL) 500 MG tablet Take 1,000 mg by mouth as needed.     [provider]  ALPRAZolam Duanne Moron) 0.25  MG tablet Take 4.5 tablet daily for vestibular neuritis 06/26/19   Lomax, Amy, NP  Ascorbic Acid (VITA-C PO) Take by mouth. Vitamin C gummies-Chew 2 daily    [provider]  Multiple Vitamin (MULTIVITAMIN) tablet Take 1 tablet by mouth daily.    [provider]  telmisartan (MICARDIS) 40 MG tablet Take 40 mg by mouth daily.    [provider]    ___________________________________________________________________________________________________ Physical Exam: Vitals with BMI 11/23/2020 11/23/2020 11/23/2020  Height - - -  Weight - - -  BMI - - -  Systolic 048 889 169  Diastolic 74 61 61  Pulse 69 75 66     1. General:  in No  Acute distress   well   -appearing 2. Psychological: Alert and   Oriented 3. Head/ENT:    Dry Mucous Membranes                          Head Non traumatic, neck supple                          Normal  Dentition 4. SKIN:  decreased Skin turgor,  Skin clean Dry and intact no rash 5. Heart: Regular rate and rhythm no  Murmur, no Rub or gallop 6. Lungs:  Clear to auscultation bilaterally, no wheezes or crackles   7. Abdomen: Soft,  non-tender, Non distended  bowel sounds present 8.  Lower extremities: no clubbing, cyanosis, no  edema 9. Neurologically  strength 5 out of 5 in all 4 extremities cranial nerves II through XII intact 10. MSK: Normal range of motion    Chart has been reviewed  ______________________________________________________________________________________________  Assessment/Plan 65 y.o. female with medical history significant of  vestibular neuritis.   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 Neurosurgical consult plan for OR when able possibly 19th.  Continue Decadron 4 mg every 8 Appreciate neurology consult given a dose of Keppra defer to neurology if further doses needed. Neurochecks every 4 Emailed Dr.  Mickeal Skinner Other plan as per orders.   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   Hypokalemia -replace and check magnesium level  DVT prophylaxis:  SCD       Code Status:    Code Status: Not on file FULL CODE  as per patient    I had personally discussed CODE STATUS with patient     Family Communication:   Family not at  Bedside    Disposition Plan:     To home once workup is complete and patient is stable   Following barriers for discharge:                            Electrolytes corrected                                                         Will need to be able to tolerate PO                                                    Will need consultants to evaluate patient prior to discharge                       Would benefit from PT/OT eval prior to DC                                     Consults called: Neurology, Nurosurgery, emailed Dr. Mickeal Skinner  Admission status:  ED Disposition     ED Disposition  [place]: [place] [[id number]]  Level of Care: Telemetry Medical [104]  May [facility] patient to [facility] or [facility] if equivalent level of care is available:: No  Covid Evaluation: Confirmed COVID Negative  Diagnosis: Brain mass [[id number]]  Admitting Physician: Toy Baker [[id number]]  Attending Physician: Toy Baker [[id number]]  Estimated length of stay: past midnight tomorrow  Certification:: I certify this patient will need inpatient services for at least 2 midnights            inpatient     I Expect 2 midnight stay secondary to severity of patient's current illness need for inpatient interventions justified by the following:    Severe lab/radiological/exam abnormalities including:      Brainmass with  neurological complaints That are currently affecting medical management.   I expect  patient to be hospitalized for 2 midnights requiring inpatient medical  care.  Patient is at high risk for adverse outcome (such as loss of life or disability) if not treated.  Indication for inpatient stay as follows:  Severe change from baseline regarding mental status   Need for operative/procedural  intervention    Need for IV steroids    Level of care     tele  For 12H     Lab Results  Component Value Date   [place] NEGATIVE 11/23/2020     Precautions: admitted as  Covid Negative    PPE: Used by the provider:   N95  eye Goggles,  Gloves     Hani Patnode 11/23/2020, 2:38 AM    [facility]     after 2 AM please page floor coverage PA If 7AM-7PM, please contact the day team taking care of the patient using [email or web address]   Patient was evaluated in the context of the global COVID-19 pandemic, which necessitated consideration that the patient might be at risk for infection with the SARS-CoV-2 virus that causes COVID-19. Institutional protocols and algorithms that pertain to the evaluation of patients at risk for COVID-19 are in a state of rapid change based on information released by regulatory bodies including the CDC and federal and state organizations. These policies and algorithms were followed during the patient's care.

## 2020-11-23 NOTE — ED Notes (Signed)
Pt care taken, no complaints at this time. 

## 2020-11-23 NOTE — ED Provider Notes (Signed)
Emergency Medicine Provider Triage Evaluation Note  Erika Mitchell , a 65 y.o. female  was evaluated in triage.  Pt complains of dizziness and confusion for the past few days.  Today she was trying to find her words and was having difficulty doing so.  Has a history of vertigo but she does not feel like this is related.  Review of Systems  Positive: Dizziness, confusion Negative: Syncope  Physical Exam  BP (!) 193/80 (BP Location: Left Arm)   Pulse 68   Temp (!) 97.5 F (36.4 C) (Oral)   Resp 16   SpO2 99%  Gen:   Awake, no distress   Resp:  Normal effort  MSK:   Moves extremities without difficulty  Other:    Medical Decision Making  Medically screening exam initiated at 3:46 PM.  Appropriate orders placed.  Erika Mitchell was informed that the remainder of the evaluation will be completed by another provider, this initial triage assessment does not replace that evaluation, and the importance of remaining in the ED until their evaluation is complete.  LKW earlier this weekend   Erika Hammock, PA-C 11/23/20 Erika Mitchell, [place], DO 11/23/20 1550

## 2020-11-23 NOTE — ED Triage Notes (Signed)
Patient reports that she began having periods of confusion and states this weekend she would go into a room and forget why she was there. Today, the patient states she could not get her hands to type or couldn't figure out how to use her cell phone.

## 2020-11-23 NOTE — ED Provider Notes (Signed)
[facility] DEPT Provider Note   CSN: [id number] Arrival date & time: 11/23/20  1534     History Chief Complaint  Patient presents with   Altered Mental Status   Dizziness    Erika Mitchell is a 65 y.o. female.  HPI 65 year old female presents with an episode of confusion.  For the past 2 or 3 weeks she has been feeling forgetful when she walks into a room.  She chronically has dizziness and this is unchanged.  However today at around 2 PM she was trying to talk to a customer on the phone and realize she could not talk.  She is not sure if she would was speaking wrong words or just could not get any words out.  However she barely talked and put the person on hold.  She then realized she was having a hard time using the computer it was like she did not use the keyboard.  She tried texting her husband but had a hard time figuring out how to use her cell phone.  Seem like she was still confused and not quite right until she got here at about 3:30 PM.  She is feeling better.  Yesterday and earlier today she had an episode where she would turn her head and it felt like her left arm was stuck and she could not move it back.  No focal weakness or numbness at this time.  The dizziness she felt today felt a little different than her typical vertigo.  She been having on and off headaches, mostly left-sided but sometimes right-sided for these past 2 or 3 weeks.  No vision changes.  No fevers or neck pain.  She is noted to have significant hypertension in the emergency department.  She states that typically in the morning her blood pressure is okay but by the time she gets home from work it is pretty elevated, often in the 180 range.  She reports that she is on medicine and has been taking it for high blood pressure.  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     Family History  Problem Relation Age of Onset   Lung cancer Mother    Aneurysm Father    Heart disease Sister    Atrial fibrillation Brother    Colon cancer Neg Hx     Social History   Tobacco Use   Smoking status: Former    Types: Cigarettes    Quit date: 02/08/1996    Years since quitting: 24.8   Smokeless tobacco: Never  Vaping Use   Vaping Use: Never used  Substance Use Topics   Alcohol use: Yes    Alcohol/week: 2.0 standard drinks    Types: 2 Glasses of wine per week   Drug use: No    Home Medications  Prior to Admission medications   Medication Sig Start Date End Date Taking? Authorizing Provider  acetaminophen (TYLENOL) 500 MG tablet Take 1,000 mg by mouth every 6 (six) hours as needed for moderate pain.   Yes [provider]  ALPRAZolam (XANAX) 0.25 MG tablet Take 4.5 tablet daily for vestibular neuritis Patient taking differently: Take 1.125 mg by mouth daily as needed (Vestibular neuritis). Patient states she takes 4 of the 0.25 mg tablets and then splits one 0.25 mg tablet to make total of 1.125 mg daily for vestibular neuritis 06/26/19  Yes Lomax, Amy, NP  Ascorbic Acid (VITA-C PO) Take 1 tablet by mouth daily at 6 (six) AM. Vitamin C gummies-Chew 2 daily   Yes [provider]  Multiple Vitamin (MULTIVITAMIN) tablet Take 1 tablet by mouth daily.   Yes [provider]  telmisartan (MICARDIS) 40 MG tablet Take 40 mg by mouth daily.   Yes [provider]    Allergies    Epinephrine,  Azithromycin, and Motrin [ibuprofen]  Review of Systems   Review of Systems  Constitutional:  Negative for fever.  Eyes:  Negative for visual disturbance.  Gastrointestinal:  Negative for vomiting.  Musculoskeletal:  Negative for neck pain.  Neurological:  Positive for dizziness, speech difficulty, weakness and headaches.  Psychiatric/Behavioral:  Positive for confusion.   All other systems reviewed and are negative.  Physical Exam Updated Vital Signs BP (!) 149/95   Pulse 70   Temp (!) 97.5 F (36.4 C) (Oral)   Resp (!) 23   Ht 5\' 7"  (1.702 m)   Wt 79.4 kg   SpO2 96%   BMI 27.41 kg/m   Physical Exam Vitals and nursing note reviewed.  Constitutional:      General: She is not in acute distress.    Appearance: She is well-developed. She is not ill-appearing or diaphoretic.  HENT:     Head: Normocephalic and atraumatic.     Right Ear: External ear normal.     Left Ear: External ear normal.     Nose: Nose normal.  Eyes:     General:        Right eye: No discharge.        Left eye: No discharge.     Extraocular Movements: Extraocular movements intact.     Pupils: Pupils are equal, round, and reactive to light.  Cardiovascular:     Rate and Rhythm: Normal rate and regular rhythm.     Heart sounds: Normal heart sounds.  Pulmonary:     Effort: Pulmonary effort is normal.     Breath sounds: Normal breath sounds.  Abdominal:     Palpations: Abdomen is soft.     Tenderness: There is no abdominal tenderness.  Musculoskeletal:     Cervical back: Neck supple. No rigidity.  Skin:    General: Skin is warm and dry.  Neurological:     Mental Status: She is alert and oriented to person, place, and time.     Comments: CN 3-12 grossly intact. 5/5 strength in all 4 extremities. Grossly normal sensation. Normal finger to nose. Normal ambulation. Is able to identify objects without difficulty. Clear speech.   Psychiatric:        Mood and Affect: Mood is not anxious.    ED Results  / Procedures / Treatments   Labs (all labs ordered are listed, but only abnormal results are displayed) Labs Reviewed  COMPREHENSIVE METABOLIC PANEL - Abnormal; Notable for the following components:  Result Value   Potassium 3.2 (*)    Glucose, Bld 118 (*)    All other components within normal limits  URINALYSIS, ROUTINE W REFLEX MICROSCOPIC - Abnormal; Notable for the following components:   Color, Urine STRAW (*)    All other components within normal limits  RESP PANEL BY RT-PCR (FLU A&B, COVID) ARPGX2  CBC WITH DIFFERENTIAL/PLATELET  MAGNESIUM  CBG MONITORING, ED    EKG EKG Interpretation  Date/Time:  Monday November 23 2020 16:51:57 EDT Ventricular Rate:  67 PR Interval:  190 QRS Duration: 88 QT Interval:  417 QTC Calculation: 441 R Axis:   67 Text Interpretation: Sinus rhythm Consider left atrial enlargement Baseline wander in lead(s) II III aVF no acute ischemia No old tracing to compare Confirmed by Sherwood Gambler [phone]) on 11/23/2020 5:02:21 PM  Radiology CT HEAD WO CONTRAST (5MM)  Result Date: 11/23/2020 CLINICAL DATA:  Neuro deficit, acute, stroke suspected Mental status change, unknown cause EXAM: CT HEAD WITHOUT CONTRAST TECHNIQUE: Contiguous axial images were obtained from the base of the skull through the vertex without intravenous contrast. COMPARISON:  None. FINDINGS: Brain: Area of low-density noted in the right frontal lobe concerning for vasogenic edema surrounding a mass. No hemorrhage. No midline shift. No hydrocephalus. Vascular: No hyperdense vessel or unexpected calcification. Skull: No acute calvarial abnormality. Sinuses/Orbits: No acute findings Other: None IMPRESSION: Edema within the right frontal lobe concerning for vasogenic edema surrounding a right frontal mass lesion. Recommend further evaluation with MRI with and without contrast. These results were called by telephone at the time of interpretation on 11/23/2020 at 5:05 pm to provider Sherwood Gambler , who verbally acknowledged these results. Electronically Signed   By: Rolm Baptise M.D.   On: 11/23/2020 17:09   MR Brain W and Wo Contrast  Result Date: 11/23/2020 CLINICAL DATA:  Abnormal head CT.  Right frontal mass. EXAM: MRI HEAD WITHOUT AND WITH CONTRAST TECHNIQUE: Multiplanar, multiecho pulse sequences of the brain and surrounding structures were obtained without and with intravenous contrast. CONTRAST:  8.94mL GADAVIST GADOBUTROL 1 MMOL/ML IV SOLN COMPARISON:  Head CT same day FINDINGS: Brain: There is an up to 3 cm in diameter necrotic peripheral intra-axial mass in the right posterior frontal lobe with surrounding vasogenic edema. The lesion extends to the surface of the brain. Most likely diagnosis is necrotic brain tumor, either primary or metastatic. Brain abscess is considered but the internal characteristics are not entirely typical. No midline shift or threatening herniation. Otherwise, the brain is normal without second lesion, ischemic change, hydrocephalus or extra-axial collection. Vascular: Major vessels at the base of the brain show flow. Skull and upper cervical spine: Negative Sinuses/Orbits: Clear/normal Other: None IMPRESSION: Approximately 3 cm in diameter necrotic lesion in the posterior right frontal lobe with surrounding vasogenic edema, most consistent with malignant brain tumor, either primary or metastatic. No second lesion is seen. The possibility of brain abscess was considered, but the internal characteristics are not typical of that diagnosis. Electronically Signed   By: Nelson Chimes M.D.   On: 11/23/2020 19:25   CT CHEST ABDOMEN PELVIS W CONTRAST  Result Date: 11/23/2020 CLINICAL DATA:  Cancer of unknown primary. Brain mass found on head CT. EXAM: CT CHEST, ABDOMEN, AND PELVIS WITH CONTRAST TECHNIQUE: Multidetector CT imaging of the chest, abdomen and pelvis was performed following the standard protocol during bolus administration of intravenous contrast.  CONTRAST:  70mL OMNIPAQUE IOHEXOL 350 MG/ML SOLN COMPARISON:  None. FINDINGS: CT CHEST FINDINGS Cardiovascular: No significant vascular findings.  Normal heart size. No pericardial effusion. Mediastinum/Nodes: No enlarged mediastinal, hilar, or axillary lymph nodes. Thyroid gland, trachea, and esophagus demonstrate no significant findings. Lungs/Pleura: There is some scarring in both lung apices. There is minimal atelectasis in the lower lobes bilaterally. There is minimal scarring or atelectasis in the lingula. The lungs are otherwise clear. Musculoskeletal: There is a single small sclerotic lesion in the T9 vertebral body measuring 5 mm. No acute fractures are seen. CT ABDOMEN PELVIS FINDINGS Hepatobiliary: The gallbladder is surgically absent. There is no biliary ductal dilatation. The liver is within normal limits Pancreas: There is questionable trace inflammatory stranding surrounding the body of the pancreas. Pancreas is otherwise within normal limits. There is no ductal dilatation or fluid collection. Spleen: Normal in size without focal abnormality. Adrenals/Urinary Tract: Adrenal glands are unremarkable. Kidneys are normal, without renal calculi, focal lesion, or hydronephrosis. Bladder is unremarkable. Stomach/Bowel: Stomach is within normal limits. Appendix appears normal. No evidence of bowel wall thickening, distention, or inflammatory changes. Vascular/Lymphatic: Aortic atherosclerosis. No enlarged abdominal or pelvic lymph nodes. Reproductive: Uterus and bilateral adnexa are unremarkable. Other: There is a small fat containing umbilical hernia. There is no ascites or free air. Musculoskeletal: There is an 8 mm sclerotic density in the left iliac wing, indeterminate. No fractures are identified. IMPRESSION: 1. Questionable mild fat stranding surrounding the pancreatic body. Please correlate clinically for mild acute pancreatitis. 2. There are 2 small indeterminate sclerotic osseous lesions (thoracic  spine and left iliac wing). 3. No acute cardiopulmonary process. Electronically Signed   By: Ronney Asters M.D.   On: 11/23/2020 18:14    Procedures Procedures   Medications Ordered in ED Medications  dexamethasone (DECADRON) injection 4 mg (4 mg Intravenous Given 11/23/20 2052)  LORazepam (ATIVAN) injection 1 mg (1 mg Intravenous Given 11/23/20 1733)  levETIRAcetam (KEPPRA) IVPB 1000 mg/100 mL premix (0 mg Intravenous Stopped 11/23/20 1900)  potassium chloride SA (KLOR-CON) CR tablet 40 mEq (40 mEq Oral Given 11/23/20 1738)  iohexol (OMNIPAQUE) 350 MG/ML injection 80 mL (80 mLs Intravenous Contrast Given 11/23/20 1746)  gadobutrol (GADAVIST) 1 MMOL/ML injection 8.5 mL (8.5 mLs Intravenous Contrast Given 11/23/20 1819)    ED Course  I have reviewed the triage vital signs and the nursing notes.  Pertinent labs & imaging results that were available during my care of the patient were reviewed by me and considered in my medical decision making (see chart for details).    MDM Rules/Calculators/A&P                           CT head shows edema and concern for mass.  Updated patient on this.  Further discussed these episodes where her left arm is hard to move and it seems like her head is turning against her will to the left.  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 Discussed with Dr. Curly Shores, Recommends IV Keppra and now of 1 g 100 mg twice daily Keppra.  MRI does show what looks like a brain mass.  Discussed with Dr. Venetia Constable who has viewed the MRI and recommends dexamethasone 4 mg every 8 hours and he will likely take to the OR on 10/19.  I have discussed extensively the diagnosis and tried to answer questions with patient and husband.  Discussed with Dr. Roel Cluck for admission Final Clinical Impression(s) / ED Diagnoses Final diagnoses:  Brain mass    Rx / DC Orders ED Discharge Orders  None        Sherwood Gambler, MD 11/23/20 [phone]

## 2020-11-23 NOTE — Consult Note (Signed)
Neurosurgery Consultation  Reason for Consult: Brain tumor Referring Physician: Cyndia Skeeters  CC: Episodic speech issues  HPI: This is a 65 y.o. left handed woman that presents with multiple episodes of paroxysmal issues with word finding difficulty, intact reception, and clumsiness of the left upper and lower extremities. These have been self-limiting, last a few minutes, and abnormal for her, occurring intermittently over the past week or so. Of note, she does have a sister that died of a GBM 2 years ago. Family was with her at bedside, they have not noticed any new concerning issues. They do not know of a family history of inherited conditions related to the prior GBM - Gardner's, etc. She writes with her left hand but is otherwise fairly ambidextrous, uses her right side for sports.   ROS: A 14 point ROS was performed and is negative except as noted in the HPI.   PMHx:  Past Medical History:  Diagnosis Date   Cancer ([place])    basal cell carcinoma on left arm left eye brow   Hypertension    Vertigo    Vestibular neuritis    right ear   FamHx:  Family History  Problem Relation Age of Onset   Lung cancer Mother    Aneurysm Father    Heart disease Sister    Atrial fibrillation Brother    Colon cancer Neg Hx    SocHx:  reports that she quit smoking about 24 years ago. Her smoking use included cigarettes. She has never used smokeless tobacco. She reports current alcohol use of about 2.0 standard drinks per week. She reports that she does not use drugs.  Exam: Vital signs in last 24 hours: Temp:  [97.8 F (36.6 C)] 97.8 F (36.6 C) (10/18 1702) Pulse Rate:  [56-97] 89 (10/18 1702) Resp:  [16-25] 18 (10/18 1702) BP: (115-159)/(61-108) 149/88 (10/18 1702) SpO2:  [80 %-100 %] 99 % (10/18 1702) General: Awake, alert, cooperative, lying in bed in NAD Head: Normocephalic and atruamatic HEENT: Neck supple Pulmonary: breathing room air comfortably, no evidence of increased work of  breathing Cardiac: RRR Abdomen: S NT ND Extremities: Warm and well perfused x4 Neuro: AOx3, PERRL, EOMI, FS, speech fluent with normal content Strength 5/5 x4, SILTx4, no drift   Assessment and Plan: 66 y.o. 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 MRI personally reviewed, which shows a right frontal enhancing mass with surrounding vasogenic edema and brain compression.  -discussed the above with the patient and her family. I recommend surgical resection to obtain a diagnosis and guide further treatment, scheduled for OR tomorrow 10/19 in the late afternoon -we discussed r/b/a of surgery, while she is having some speech issues, on the MRI it is clearly superior to the 3 gyri of the right sided correlate of Broca's, likely a symptomatic zone next to an epileptogenic zone, could also be SMA s/d, we discussed that she certainly could have a post-op SMA s/d and the natural history of that condition -NPO after midnight - pt is in transport right now and my NPO order may fall off as she switches facilities  Judith Part, MD 11/24/20 5:31 PM [facility]

## 2020-11-23 NOTE — Plan of Care (Signed)
Discussed briefly with Dr. Regenia Skeeter, patient with new hypodensity on head CT concerning for brain mass, presenting with spells concerning for focal seizure.  Currently back to baseline without any clear focal neurodeficits on his examination.  Recommended MRI brain with and without contrast, loading dose of Keppra 1000 mg, neurology evaluation including neurooncology evaluation by Dr. Mickeal Skinner in the morning.  Likely will need neurosurgical evaluation as well pending initial work-up as detailed above as well as CT chest abdomen pelvis with contrast for malignancy screening.  I will put her on our list for full neurological consult.   Lesleigh Noe MD-PhD [facility] [phone]  Telecoverage for emergent consults (code strokes, status) and brief curbside recommendations at [facility], [facility], [place] is from 8 AM to to 8 PM by [facility].  For a full routine consults [facility] neurohospitalist should be contacted; information can be found on AMION

## 2020-11-24 DIAGNOSIS — G9389 Other specified disorders of brain: Secondary | ICD-10-CM | POA: Diagnosis not present

## 2020-11-24 DIAGNOSIS — H8122 Vestibular neuronitis, left ear: Secondary | ICD-10-CM | POA: Diagnosis not present

## 2020-11-24 DIAGNOSIS — E876 Hypokalemia: Secondary | ICD-10-CM | POA: Diagnosis not present

## 2020-11-24 DIAGNOSIS — G40209 Localization-related (focal) (partial) symptomatic epilepsy and epileptic syndromes with complex partial seizures, not intractable, without status epilepticus: Secondary | ICD-10-CM | POA: Diagnosis not present

## 2020-11-24 LAB — COMPREHENSIVE METABOLIC PANEL
ALT: 23 U/L (ref 0–44)
AST: 25 U/L (ref 15–41)
Albumin: 4.3 g/dL (ref 3.5–5.0)
Alkaline Phosphatase: 56 U/L (ref 38–126)
Anion gap: 7 (ref 5–15)
BUN: 6 mg/dL — ABNORMAL LOW (ref 8–23)
CO2: 21 mmol/L — ABNORMAL LOW (ref 22–32)
Calcium: 8.7 mg/dL — ABNORMAL LOW (ref 8.9–10.3)
Chloride: 111 mmol/L (ref 98–111)
Creatinine, Ser: 0.57 mg/dL (ref 0.44–1.00)
GFR, Estimated: >60 mL/min (ref >60)
Glucose, Bld: 153 mg/dL — ABNORMAL HIGH (ref 70–99)
Potassium: 4.1 mmol/L (ref 3.5–5.1)
Sodium: 139 mmol/L (ref 135–145)
Total Bilirubin: 0.7 mg/dL (ref 0.3–1.2)
Total Protein: 6.7 g/dL (ref 6.5–8.1)

## 2020-11-24 LAB — CBC WITH DIFFERENTIAL/PLATELET
Abs Immature Granulocytes: 0.02 10*3/uL (ref 0.00–0.07)
Basophils Absolute: 0 10*3/uL (ref 0.0–0.1)
Basophils Relative: 0 %
Eosinophils Absolute: 0 10*3/uL (ref 0.0–0.5)
Eosinophils Relative: 0 %
HCT: 45.1 % (ref 36.0–46.0)
Hemoglobin: 15 g/dL (ref 12.0–15.0)
Immature Granulocytes: 1 %
Lymphocytes Relative: 19 %
Lymphs Abs: 0.8 10*3/uL (ref 0.7–4.0)
MCH: 31.8 pg (ref 26.0–34.0)
MCHC: 33.3 g/dL (ref 30.0–36.0)
MCV: 95.8 fL (ref 80.0–100.0)
Monocytes Absolute: 0.1 10*3/uL (ref 0.1–1.0)
Monocytes Relative: 1 %
Neutro Abs: 3.3 10*3/uL (ref 1.7–7.7)
Neutrophils Relative %: 79 %
Platelets: 158 10*3/uL (ref 150–400)
RBC: 4.71 MIL/uL (ref 3.87–5.11)
RDW: 12.7 % (ref 11.5–15.5)
WBC: 4.2 10*3/uL (ref 4.0–10.5)
nRBC: 0 % (ref 0.0–0.2)

## 2020-11-24 LAB — HIV ANTIBODY (ROUTINE TESTING W REFLEX): HIV Screen 4th Generation wRfx: NONREACTIVE

## 2020-11-24 LAB — TSH: TSH: 3.017 u[IU]/mL (ref 0.350–4.500)

## 2020-11-24 LAB — MAGNESIUM
Magnesium: 1.9 mg/dL (ref 1.7–2.4)
Magnesium: 2.2 mg/dL (ref 1.7–2.4)

## 2020-11-24 LAB — PHOSPHORUS: Phosphorus: 2.9 mg/dL (ref 2.5–4.6)

## 2020-11-24 MED ORDER — HYDROCODONE-ACETAMINOPHEN 5-325 MG PO TABS
1.0000 | ORAL_TABLET | ORAL | Status: DC | PRN
  Administered 2020-11-25 – 2020-11-26 (×2): 1 via ORAL
  Filled 2020-11-24 (×2): qty 1

## 2020-11-24 MED ORDER — SODIUM CHLORIDE 0.9 % IV SOLN: 250.0000 mL | INTRAVENOUS | Status: DC | PRN

## 2020-11-24 MED ORDER — ACETAMINOPHEN 650 MG RE SUPP: 650.0000 mg | Freq: Four times a day (QID) | RECTAL | Status: DC | PRN

## 2020-11-24 MED ORDER — ALPRAZOLAM 0.5 MG PO TABS
1.1250 mg | ORAL_TABLET | Freq: Two times a day (BID) | ORAL | Status: DC | PRN
  Administered 2020-11-24 – 2020-11-25 (×2): 1.125 mg via ORAL
  Filled 2020-11-24: qty 1
  Filled 2020-11-24: qty 2

## 2020-11-24 MED ORDER — ALPRAZOLAM 0.5 MG PO TABS: 1.1250 mg | ORAL_TABLET | Freq: Every day | ORAL | Status: DC | PRN

## 2020-11-24 MED ORDER — SODIUM CHLORIDE 0.9% FLUSH
3.0000 mL | Freq: Two times a day (BID) | INTRAVENOUS | Status: DC
  Administered 2020-11-24 – 2020-11-25 (×2): 3 mL via INTRAVENOUS

## 2020-11-24 MED ORDER — LEVETIRACETAM IN NACL 500 MG/100ML IV SOLN
500.0000 mg | Freq: Two times a day (BID) | INTRAVENOUS | Status: DC
  Administered 2020-11-24 – 2020-11-26 (×5): 500 mg via INTRAVENOUS
  Filled 2020-11-24 (×6): qty 100

## 2020-11-24 MED ORDER — SODIUM CHLORIDE 0.9% FLUSH: 3.0000 mL | INTRAVENOUS | Status: DC | PRN

## 2020-11-24 MED ORDER — ACETAMINOPHEN 325 MG PO TABS
650.0000 mg | ORAL_TABLET | Freq: Four times a day (QID) | ORAL | Status: DC | PRN
  Administered 2020-11-24 – 2020-11-26 (×3): 650 mg via ORAL
  Filled 2020-11-24 (×3): qty 2

## 2020-11-24 NOTE — Consult Note (Signed)
[facility] Consult Note  Patient Care Team: Ginger Organ., MD as PCP - General (Internal Medicine)  CHIEF COMPLAINTS/PURPOSE OF CONSULTATION:  Seizures R frontal mass  HISTORY OF PRESENTING ILLNESS:  Erika Mitchell 65 y.o. female presented with several episodes of sudden dysfunction of left arm and leg, lasting several minutes, with most recent episode accompanied by frank speech arrest while at work.  First episode was less than one week ago.  Between, she has been normal, at baseline.  Currently, she has no complaints after treatment with steroids and seizure medication.    MEDICAL HISTORY:  Past Medical History:  Diagnosis Date   Cancer ([place])    basal cell carcinoma on left arm left eye brow   Hypertension    Vertigo    Vestibular neuritis    right ear    SURGICAL HISTORY: Past Surgical History:  Procedure Laterality Date   CHOLECYSTECTOMY  2017   laproscopy     surgery for endometriosisi   OOPHORECTOMY  1974&1990   rt then left ovary    SOCIAL HISTORY: Social History   Socioeconomic History   Marital status: Married    Spouse name: [name]   Number of children: 1   Years of education: 14   Highest education level: Not on file  Occupational History    Employer: [employer]  Tobacco Use   Smoking status: Former    Types: Cigarettes    Quit date: 02/08/1996    Years since quitting: 24.8   Smokeless tobacco: Never  Vaping Use   Vaping Use: Never used  Substance and Sexual Activity   Alcohol use: Yes    Alcohol/week: 2.0 standard drinks    Types: 2 Glasses of wine per week   Drug use: No   Sexual activity: Not on file  Other Topics Concern   Not on file  Social History Narrative   Patient is married [name]).     Patient is left-handed.   Patient is working full-time.   Patient has a college education (Associate's)   Patient has one child.   Patient drinks 3 Cups of coffee daily.   Social Determinants of  Health   Financial Resource Strain: Not on file  Food Insecurity: Not on file  Transportation Needs: Not on file  Physical Activity: Not on file  Stress: Not on file  Social Connections: Not on file  Intimate Partner Violence: Not on file    FAMILY HISTORY: Family History  Problem Relation Age of Onset   Lung cancer Mother    Aneurysm Father    Heart disease Sister    Atrial fibrillation Brother    Colon cancer Neg Hx     ALLERGIES:  is allergic to epinephrine, azithromycin, and motrin [ibuprofen].  MEDICATIONS:  Current Facility-Administered Medications  Medication Dose Route Frequency Provider Last Rate Last Admin   0.9 %  sodium chloride infusion  250 mL Intravenous PRN Doutova, Anastassia, MD       acetaminophen (TYLENOL) tablet 650 mg  650 mg Oral Q6H PRN Doutova, Anastassia, MD   650 mg at 11/24/20 0503   Or   acetaminophen (TYLENOL) suppository 650 mg  650 mg Rectal Q6H PRN Toy Baker, MD       ALPRAZolam Duanne Moron) tablet 1.125 mg  1.125 mg Oral BID PRN Caren Griffins, MD   1.125 mg at 11/24/20 0837   dexamethasone (DECADRON) injection 4 mg  4 mg Intravenous Q8H Toy Baker, MD   4  mg at 11/24/20 1251   HYDROcodone-acetaminophen (NORCO/VICODIN) 5-325 MG per tablet 1-2 tablet  1-2 tablet Oral Q4H PRN Toy Baker, MD       levETIRAcetam (KEPPRA) IVPB 500 mg/100 mL premix  500 mg Intravenous Q12H Caren Griffins, MD   Stopped at 11/24/20 0900   sodium chloride flush (NS) 0.9 % injection 3 mL  3 mL Intravenous Q12H Doutova, Anastassia, MD       sodium chloride flush (NS) 0.9 % injection 3 mL  3 mL Intravenous PRN Toy Baker, MD       Current Outpatient Medications  Medication Sig Dispense Refill   acetaminophen (TYLENOL) 500 MG tablet Take 1,000 mg by mouth every 6 (six) hours as needed for moderate pain.     ALPRAZolam (XANAX) 0.25 MG tablet Take 4.5 tablet daily for vestibular neuritis (Patient taking differently: Take 1.125 mg by  mouth daily as needed (Vestibular neuritis). Patient states she takes 4 of the 0.25 mg tablets and then splits one 0.25 mg tablet to make total of 1.125 mg daily for vestibular neuritis) 135 tablet 5   Ascorbic Acid (VITA-C PO) Take 1 tablet by mouth daily at 6 (six) AM. Vitamin C gummies-Chew 2 daily     Multiple Vitamin (MULTIVITAMIN) tablet Take 1 tablet by mouth daily.     telmisartan (MICARDIS) 40 MG tablet Take 40 mg by mouth daily.      REVIEW OF SYSTEMS:   Constitutional: Denies fevers, chills or abnormal weight loss Eyes: Denies blurriness of vision Ears, nose, mouth, throat, and face: Denies mucositis or sore throat Respiratory: Denies cough, dyspnea or wheezes Cardiovascular: Denies palpitation, chest discomfort or lower extremity swelling Gastrointestinal:  Denies nausea, constipation, diarrhea GU: Denies dysuria or incontinence Skin: Denies abnormal skin rashes Neurological: Per HPI Musculoskeletal: Denies joint pain, back or neck discomfort. No decrease in ROM Behavioral/Psych: Denies anxiety, disturbance in thought content, and mood instability   PHYSICAL EXAMINATION: Vitals:   11/24/20 1322 11/24/20 1500  BP: (!) 146/73 (!) 158/83  Pulse: 79 95  Resp: 18 20  Temp:    SpO2: 98% 98%   KPS: 90. General: Alert, cooperative, pleasant, in no acute distress Head: Normal EENT: No conjunctival injection or scleral icterus. Oral mucosa moist Lungs: Resp effort normal Cardiac: Regular rate and rhythm Abdomen: Soft, non-distended abdomen Skin: No rashes cyanosis or petechiae. Extremities: No clubbing or edema  NEUROLOGIC EXAM: Mental Status: Awake, alert, attentive to examiner. Oriented to self and environment. Language is fluent with intact comprehension.  Cranial Nerves: Visual acuity is grossly normal. Visual fields are full. Extra-ocular movements intact. No ptosis. Face is symmetric, tongue midline. Motor: Tone and bulk are normal. Power is full in both arms and  legs. Reflexes are symmetric, no pathologic reflexes present. Intact finger to nose bilaterally Sensory: Intact to light touch and temperature Gait: Deferred   LABORATORY DATA:  I have reviewed the data as listed Lab Results  Component Value Date   WBC 4.2 11/24/2020   HGB 15.0 11/24/2020   HCT 45.1 11/24/2020   MCV 95.8 11/24/2020   PLT 158 11/24/2020   Recent Labs    11/23/20 1609 11/24/20 0620  NA 138 139  K 3.2* 4.1  CL 105 111  CO2 26 21*  GLUCOSE 118* 153*  BUN 8 6*  CREATININE 0.68 0.57  CALCIUM 9.2 8.7*  GFRNONAA >60 >60  PROT 7.1 6.7  ALBUMIN 4.6 4.3  AST 23 25  ALT 25 23  ALKPHOS 58 56  BILITOT  0.5 0.7    RADIOGRAPHIC STUDIES: I have personally reviewed the radiological images as listed and agreed with the findings in the report. CT HEAD WO CONTRAST (5MM)  Result Date: 11/23/2020 CLINICAL DATA:  Neuro deficit, acute, stroke suspected Mental status change, unknown cause EXAM: CT HEAD WITHOUT CONTRAST TECHNIQUE: Contiguous axial images were obtained from the base of the skull through the vertex without intravenous contrast. COMPARISON:  None. FINDINGS: Brain: Area of low-density noted in the right frontal lobe concerning for vasogenic edema surrounding a mass. No hemorrhage. No midline shift. No hydrocephalus. Vascular: No hyperdense vessel or unexpected calcification. Skull: No acute calvarial abnormality. Sinuses/Orbits: No acute findings Other: None IMPRESSION: Edema within the right frontal lobe concerning for vasogenic edema surrounding a right frontal mass lesion. Recommend further evaluation with MRI with and without contrast. These results were called by telephone at the time of interpretation on 11/23/2020 at 5:05 pm to provider Sherwood Gambler , who verbally acknowledged these results. Electronically Signed   By: Rolm Baptise M.D.   On: 11/23/2020 17:09   MR Brain W and Wo Contrast  Result Date: 11/23/2020 CLINICAL DATA:  Abnormal head CT.  Right frontal  mass. EXAM: MRI HEAD WITHOUT AND WITH CONTRAST TECHNIQUE: Multiplanar, multiecho pulse sequences of the brain and surrounding structures were obtained without and with intravenous contrast. CONTRAST:  8.18mL GADAVIST GADOBUTROL 1 MMOL/ML IV SOLN COMPARISON:  Head CT same day FINDINGS: Brain: There is an up to 3 cm in diameter necrotic peripheral intra-axial mass in the right posterior frontal lobe with surrounding vasogenic edema. The lesion extends to the surface of the brain. Most likely diagnosis is necrotic brain tumor, either primary or metastatic. Brain abscess is considered but the internal characteristics are not entirely typical. No midline shift or threatening herniation. Otherwise, the brain is normal without second lesion, ischemic change, hydrocephalus or extra-axial collection. Vascular: Major vessels at the base of the brain show flow. Skull and upper cervical spine: Negative Sinuses/Orbits: Clear/normal Other: None IMPRESSION: Approximately 3 cm in diameter necrotic lesion in the posterior right frontal lobe with surrounding vasogenic edema, most consistent with malignant brain tumor, either primary or metastatic. No second lesion is seen. The possibility of brain abscess was considered, but the internal characteristics are not typical of that diagnosis. Electronically Signed   By: Nelson Chimes M.D.   On: 11/23/2020 19:25   CT CHEST ABDOMEN PELVIS W CONTRAST  Result Date: 11/23/2020 CLINICAL DATA:  Cancer of unknown primary. Brain mass found on head CT. EXAM: CT CHEST, ABDOMEN, AND PELVIS WITH CONTRAST TECHNIQUE: Multidetector CT imaging of the chest, abdomen and pelvis was performed following the standard protocol during bolus administration of intravenous contrast. CONTRAST:  77mL OMNIPAQUE IOHEXOL 350 MG/ML SOLN COMPARISON:  None. FINDINGS: CT CHEST FINDINGS Cardiovascular: No significant vascular findings. Normal heart size. No pericardial effusion. Mediastinum/Nodes: No enlarged mediastinal,  hilar, or axillary lymph nodes. Thyroid gland, trachea, and esophagus demonstrate no significant findings. Lungs/Pleura: There is some scarring in both lung apices. There is minimal atelectasis in the lower lobes bilaterally. There is minimal scarring or atelectasis in the lingula. The lungs are otherwise clear. Musculoskeletal: There is a single small sclerotic lesion in the T9 vertebral body measuring 5 mm. No acute fractures are seen. CT ABDOMEN PELVIS FINDINGS Hepatobiliary: The gallbladder is surgically absent. There is no biliary ductal dilatation. The liver is within normal limits Pancreas: There is questionable trace inflammatory stranding surrounding the body of the pancreas. Pancreas is otherwise within normal limits.  There is no ductal dilatation or fluid collection. Spleen: Normal in size without focal abnormality. Adrenals/Urinary Tract: Adrenal glands are unremarkable. Kidneys are normal, without renal calculi, focal lesion, or hydronephrosis. Bladder is unremarkable. Stomach/Bowel: Stomach is within normal limits. Appendix appears normal. No evidence of bowel wall thickening, distention, or inflammatory changes. Vascular/Lymphatic: Aortic atherosclerosis. No enlarged abdominal or pelvic lymph nodes. Reproductive: Uterus and bilateral adnexa are unremarkable. Other: There is a small fat containing umbilical hernia. There is no ascites or free air. Musculoskeletal: There is an 8 mm sclerotic density in the left iliac wing, indeterminate. No fractures are identified. IMPRESSION: 1. Questionable mild fat stranding surrounding the pancreatic body. Please correlate clinically for mild acute pancreatitis. 2. There are 2 small indeterminate sclerotic osseous lesions (thoracic spine and left iliac wing). 3. No acute cardiopulmonary process. Electronically Signed   By: Ronney Asters M.D.   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  We reviewed some of the likely or possible outcomes at bedside.  Dr. Zada Finders will meet with her and family, decide on surgical approach.    We will follow closely, patient's case will be reviewed further in brain/spine tumor board meeting on 11/30/20.  Ok to [employer], Decadron.  All questions were answered. The patient knows to call the clinic with any problems, questions or concerns.  The total time spent in the encounter was 55 minutes and more than 50% was on counseling and review of test results     Ventura Sellers, MD 11/24/2020 3:23 PM

## 2020-11-24 NOTE — Progress Notes (Signed)
PROGRESS NOTE  CLEDA IMEL MWU:[id number] DOB: 1955-10-11 DOA: 11/23/2020 PCP: Ginger Organ., MD   LOS: 1 day   Brief Narrative / Interim history: 65 year old female with history of vestibular neuritis, hypertension, vertigo comes into the hospital with intermittent confusion, forgetfulness and occasional involuntary movements with her head turning to the left argatroban.  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  Neurology and neurosurgery were consulted, recommending Keppra and Decadron and admission to [facility] from [place] long ED, with tentative plans per chart review for surgical intervention on Wednesday  Subjective / 24h Interval events: She is doing well this morning, no chest pain, no shortness of breath  Assessment & Plan: Principal Problem Brain mass-patient awaiting transfer to [facility], neurology and neurosurgery consulted with plans for OR on Wednesday 10/19.  Continue Decadron, she was given a loading dose of Keppra, will continue -Dr. 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 Yes; Fluid consistency: Thin  Diet effective now       Question Answer Comment  Room service appropriate? Yes   Fluid consistency: Thin      11/24/20 0352            DVT prophylaxis: SCDs Start: 11/24/20 0353     Code Status: Full Code  Family Communication: no family at  bedside   Status is: Inpatient  Remains inpatient appropriate because: Severity of illness  Level of care: Telemetry Medical  Consultants:  Neurology Neurosurgery Neurooncology  Procedures:  none  Microbiology  none  Antimicrobials: none    Objective: Vitals:   11/24/20 0200 11/24/20 0400 11/24/20 0500 11/24/20 0600  BP: 115/69 137/64 (!) 142/72 (!) 145/90  Pulse: (!) 56 75 68 78  Resp: 19 18 18 16   Temp:      TempSrc:      SpO2: 96% 95% 97% 100%  Weight:      Height:       No intake or output data in the 24 hours ending 11/24/20 0713 Filed Weights   11/23/20 1547  Weight: 79.4 kg    Examination:  Constitutional: NAD Eyes: no scleral icterus ENMT: Mucous membranes are moist.  Neck: normal, supple Respiratory: clear to auscultation bilaterally, no wheezing, no crackles. Normal respiratory effort.  Cardiovascular: Regular rate and rhythm, no murmurs / rubs / gallops. No LE edema.  Abdomen: non distended, no tenderness. Bowel sounds positive.  Musculoskeletal: no clubbing / cyanosis.  Skin: no rashes Neurologic: non focal, ambulatory  Psychiatric: Normal judgment and insight. Alert and oriented x 3.    Data Reviewed: I have independently reviewed following labs and imaging studies   CBC: Recent Labs  Lab 11/23/20 1608 11/24/20 0547  WBC 4.1 4.2  NEUTROABS 2.3 3.3  HGB 15.0 15.0  HCT 44.4 45.1  MCV 95.5 95.8  PLT 184 725   Basic Metabolic Panel: Recent Labs  Lab 11/23/20 1609 11/24/20 0620  NA 138 139  K 3.2* 4.1  CL 105 111  CO2 26 21*  GLUCOSE 118* 153*  BUN 8 6*  CREATININE 0.68 0.57  CALCIUM 9.2 8.7*  MG  --  1.9  PHOS  --  2.9   Liver Function Tests: Recent Labs  Lab 11/23/20 1609 11/24/20 0620  AST 23 25  ALT 25 23  ALKPHOS 58 56  BILITOT 0.5 0.7  PROT 7.1 6.7  ALBUMIN 4.6 4.3   Coagulation Profile: No results for input(s): INR, PROTIME in the last 168 hours. HbA1C: No results for input(s): HGBA1C in the last 72  hours. CBG: Recent Labs  Lab 11/23/20 1658  GLUCAP 96    Recent Results (from the past 240 hour(s))  Resp Panel by RT-PCR (Flu A&B, Covid) Nasopharyngeal Swab     Status: None   Collection Time: 11/23/20  7:51 PM   Specimen: Nasopharyngeal Swab; Nasopharyngeal(NP) swabs in vial transport medium  Result Value Ref Range Status   SARS Coronavirus 2 by RT PCR NEGATIVE NEGATIVE Final    Comment: (NOTE) SARS-CoV-2 target nucleic acids are NOT DETECTED.  The SARS-CoV-2 RNA is generally detectable in upper respiratory specimens during the acute phase of infection. The lowest concentration of SARS-CoV-2 viral copies this assay can detect is 138 copies/mL. A negative result does not preclude SARS-Cov-2 infection and should not be used as the sole basis for treatment or other patient management decisions. A negative result may occur with  improper specimen collection/handling, submission of specimen other than nasopharyngeal swab, presence of viral mutation(s) within the areas targeted by this assay, and inadequate number of viral copies(<138 copies/mL). A negative result must be combined with clinical observations, patient history, and epidemiological information. The expected result is Negative.  Fact Sheet for Patients:  EntrepreneurPulse.com.au  Fact Sheet for Healthcare Providers:  IncredibleEmployment.be  This test is no t yet approved or cleared by the [place] FDA and  has been authorized for detection and/or diagnosis of SARS-CoV-2 by FDA under an Emergency Use Authorization (EUA). This EUA will remain  in effect (meaning this test can be used) for the duration of the COVID-19 declaration under Section 564(b)(1) of the Act, 21 U.S.C.section 360bbb-3(b)(1), unless the authorization is terminated  or revoked sooner.       Influenza A by PCR NEGATIVE NEGATIVE Final   Influenza B by PCR NEGATIVE NEGATIVE Final    Comment: (NOTE) The  Xpert Xpress SARS-CoV-2/FLU/RSV plus assay is intended as an aid in the diagnosis of influenza from Nasopharyngeal swab specimens and should not be used as a sole basis for treatment. Nasal washings and aspirates are unacceptable for Xpert Xpress SARS-CoV-2/FLU/RSV testing.  Fact Sheet for Patients: EntrepreneurPulse.com.au  Fact Sheet for Healthcare Providers: IncredibleEmployment.be  This test is not yet approved or cleared by the [place] FDA and has been authorized for detection and/or diagnosis of SARS-CoV-2 by FDA under an Emergency Use Authorization (EUA). This EUA will remain in effect (meaning this test can be used) for the duration of the COVID-19 declaration under Section 564(b)(1) of the Act, 21 U.S.C. section 360bbb-3(b)(1), unless the authorization is terminated or revoked.  Performed at [facility], [place] [address] [place], [place] [address]      Radiology Studies: CT HEAD WO CONTRAST (5MM)  Result Date: 11/23/2020 CLINICAL DATA:  Neuro deficit, acute, stroke suspected Mental status change, unknown cause EXAM: CT HEAD WITHOUT CONTRAST TECHNIQUE: Contiguous axial images were obtained from the base of the skull through the vertex without intravenous contrast.  COMPARISON:  None. FINDINGS: Brain: Area of low-density noted in the right frontal lobe concerning for vasogenic edema surrounding a mass. No hemorrhage. No midline shift. No hydrocephalus. Vascular: No hyperdense vessel or unexpected calcification. Skull: No acute calvarial abnormality. Sinuses/Orbits: No acute findings Other: None IMPRESSION: Edema within the right frontal lobe concerning for vasogenic edema surrounding a right frontal mass lesion. Recommend further evaluation with MRI with and without contrast. These results were called by telephone at the time of interpretation on 11/23/2020 at 5:05 pm to provider Sherwood Gambler , who verbally acknowledged  these results. Electronically Signed   By: Rolm Baptise M.D.   On: 11/23/2020 17:09   MR Brain W and Wo Contrast  Result Date: 11/23/2020 CLINICAL DATA:  Abnormal head CT.  Right frontal mass. EXAM: MRI HEAD WITHOUT AND WITH CONTRAST TECHNIQUE: Multiplanar, multiecho pulse sequences of the brain and surrounding structures were obtained without and with intravenous contrast. CONTRAST:  8.60mL GADAVIST GADOBUTROL 1 MMOL/ML IV SOLN COMPARISON:  Head CT same day FINDINGS: Brain: There is an up to 3 cm in diameter necrotic peripheral intra-axial mass in the right posterior frontal lobe with surrounding vasogenic edema. The lesion extends to the surface of the brain. Most likely diagnosis is necrotic brain tumor, either primary or metastatic. Brain abscess is considered but the internal characteristics are not entirely typical. No midline shift or threatening herniation. Otherwise, the brain is normal without second lesion, ischemic change, hydrocephalus or extra-axial collection. Vascular: Major vessels at the base of the brain show flow. Skull and upper cervical spine: Negative Sinuses/Orbits: Clear/normal Other: None IMPRESSION: Approximately 3 cm in diameter necrotic lesion in the posterior right frontal lobe with surrounding vasogenic edema, most consistent with malignant brain tumor, either primary or metastatic. No second lesion is seen. The possibility of brain abscess was considered, but the internal characteristics are not typical of that diagnosis. Electronically Signed   By: Nelson Chimes M.D.   On: 11/23/2020 19:25   CT CHEST ABDOMEN PELVIS W CONTRAST  Result Date: 11/23/2020 CLINICAL DATA:  Cancer of unknown primary. Brain mass found on head CT. EXAM: CT CHEST, ABDOMEN, AND PELVIS WITH CONTRAST TECHNIQUE: Multidetector CT imaging of the chest, abdomen and pelvis was performed following the standard protocol during bolus administration of intravenous contrast. CONTRAST:  66mL OMNIPAQUE IOHEXOL 350  MG/ML SOLN COMPARISON:  None. FINDINGS: CT CHEST FINDINGS Cardiovascular: No significant vascular findings. Normal heart size. No pericardial effusion. Mediastinum/Nodes: No enlarged mediastinal, hilar, or axillary lymph nodes. Thyroid gland, trachea, and esophagus demonstrate no significant findings. Lungs/Pleura: There is some scarring in both lung apices. There is minimal atelectasis in the lower lobes bilaterally. There is minimal scarring or atelectasis in the lingula. The lungs are otherwise clear. Musculoskeletal: There is a single small sclerotic lesion in the T9 vertebral body measuring 5 mm. No acute fractures are seen. CT ABDOMEN PELVIS FINDINGS Hepatobiliary: The gallbladder is surgically absent. There is no biliary ductal dilatation. The liver is within normal limits Pancreas: There is questionable trace inflammatory stranding surrounding the body of the pancreas. Pancreas is otherwise within normal limits. There is no ductal dilatation or fluid collection. Spleen: Normal in size without focal abnormality. Adrenals/Urinary Tract: Adrenal glands are unremarkable. Kidneys are normal, without renal calculi, focal lesion, or hydronephrosis. Bladder is unremarkable. Stomach/Bowel: Stomach is within normal limits. Appendix appears normal. No evidence of bowel wall thickening, distention, or inflammatory changes. Vascular/Lymphatic: Aortic atherosclerosis. No enlarged abdominal or pelvic lymph nodes. Reproductive: Uterus and bilateral adnexa are  unremarkable. Other: There is a small fat containing umbilical hernia. There is no ascites or free air. Musculoskeletal: There is an 8 mm sclerotic density in the left iliac wing, indeterminate. No fractures are identified. IMPRESSION: 1. Questionable mild fat stranding surrounding the pancreatic body. Please correlate clinically for mild acute pancreatitis. 2. There are 2 small indeterminate sclerotic osseous lesions (thoracic spine and left iliac wing). 3. No acute  cardiopulmonary process. Electronically Signed   By: Ronney Asters M.D.   On: 11/23/2020 18:14     Marzetta Board, MD, PhD [facility]  Between 7 am - 7 pm I am available, please contact me via Amion (for emergencies) or Securechat (non urgent messages)  Between 7 pm - 7 am I am not available, please contact night coverage MD/APP via Amion

## 2020-11-24 NOTE — Progress Notes (Signed)
PT Cancellation Note  Patient Details Name: Erika Mitchell MRN: [id number] DOB: 28-Aug-1955   Cancelled Treatment:     Chart reviewed, noted pt for transfer to [facility] for Neuro surgery tomorrow. Will hold PT eval, until further guidance post surgery.   Thank you    Clide Dales 11/24/2020, 10:27 AM Gatha Mayer, PT, MPT Acute Rehabilitation Services Office: [phone] Pager: [phone] 11/24/2020

## 2020-11-24 NOTE — ED Notes (Signed)
[facility] called for transport. 

## 2020-11-24 NOTE — Progress Notes (Signed)
Inpatient Diabetes Program Recommendations  AACE/ADA: New Consensus Statement on Inpatient Glycemic Control (2015)  Target Ranges:  Prepandial:   less than 140 mg/dL      Peak postprandial:   less than 180 mg/dL (1-2 hours)      Critically ill patients:  140 - 180 mg/dL   Lab Results  Component Value Date   GLUCAP 96 11/23/2020    Review of Glycemic Control  Diabetes history: none Outpatient Diabetes medications: N/A Current orders for Inpatient glycemic control: none  On Decadron 4 mg Q8H Blood sugars have all been < 180 mg/dL To transfer to [facility] for neurosurgery  Inpatient Diabetes Program Recommendations:    Spoke with pt at bedside in ED about steroids' affect on blood sugar control. Pt drinks diet soda at home. Has no hx of DM.   Consider HgbA1C to assess glycemic control over past 2-3 months.   Thank you. Lorenda Peck, RD, LDN, CDE Inpatient Diabetes Coordinator [phone]

## 2020-11-25 ENCOUNTER — Encounter (HOSPITAL_COMMUNITY): Payer: Self-pay | Admitting: Internal Medicine

## 2020-11-25 ENCOUNTER — Inpatient Hospital Stay (HOSPITAL_COMMUNITY): Payer: BC Managed Care – PPO | Admitting: Certified Registered"

## 2020-11-25 ENCOUNTER — Encounter (HOSPITAL_COMMUNITY)
Admission: EM | Disposition: A | Discharge status: Home / Self Care | Payer: Self-pay | Source: Home / Self Care | Attending: Internal Medicine

## 2020-11-25 DIAGNOSIS — C719 Malignant neoplasm of brain, unspecified: Secondary | ICD-10-CM | POA: Diagnosis present

## 2020-11-25 DIAGNOSIS — G9389 Other specified disorders of brain: Secondary | ICD-10-CM | POA: Diagnosis not present

## 2020-11-25 DIAGNOSIS — H8122 Vestibular neuronitis, left ear: Secondary | ICD-10-CM | POA: Diagnosis not present

## 2020-11-25 DIAGNOSIS — D496 Neoplasm of unspecified behavior of brain: Secondary | ICD-10-CM | POA: Diagnosis present

## 2020-11-25 HISTORY — PX: CRANIOTOMY: SHX93

## 2020-11-25 HISTORY — PX: APPLICATION OF CRANIAL NAVIGATION: SHX6578

## 2020-11-25 LAB — TYPE AND SCREEN
ABO/RH(D): A POS
Antibody Screen: NEGATIVE

## 2020-11-25 LAB — CBC
HCT: 39.8 % (ref 36.0–46.0)
Hemoglobin: 13.1 g/dL (ref 12.0–15.0)
MCH: 31.3 pg (ref 26.0–34.0)
MCHC: 32.9 g/dL (ref 30.0–36.0)
MCV: 95.2 fL (ref 80.0–100.0)
Platelets: 195 10*3/uL (ref 150–400)
RBC: 4.18 MIL/uL (ref 3.87–5.11)
RDW: 13.2 % (ref 11.5–15.5)
WBC: 14.5 10*3/uL — ABNORMAL HIGH (ref 4.0–10.5)
nRBC: 0 % (ref 0.0–0.2)

## 2020-11-25 LAB — ABO/RH: ABO/RH(D): A POS

## 2020-11-25 SURGERY — CRANIOTOMY TUMOR EXCISION
Anesthesia: General | Site: Head | Laterality: Right

## 2020-11-25 MED ORDER — PROPOFOL 10 MG/ML IV BOLUS
INTRAVENOUS | Status: DC | PRN
  Administered 2020-11-25: 50 mg via INTRAVENOUS
  Administered 2020-11-25: 100 mg via INTRAVENOUS

## 2020-11-25 MED ORDER — PROMETHAZINE HCL 25 MG PO TABS: 12.5000 mg | ORAL_TABLET | ORAL | Status: DC | PRN

## 2020-11-25 MED ORDER — OXYCODONE HCL 5 MG PO TABS: 5.0000 mg | ORAL_TABLET | Freq: Once | ORAL | Status: DC | PRN

## 2020-11-25 MED ORDER — MIDAZOLAM HCL 2 MG/2ML IJ SOLN: 0.5000 mg | Freq: Once | INTRAMUSCULAR | Status: DC | PRN

## 2020-11-25 MED ORDER — CEFAZOLIN SODIUM-DEXTROSE 2-4 GM/100ML-% IV SOLN
2.0000 g | Freq: Three times a day (TID) | INTRAVENOUS | Status: AC
  Administered 2020-11-25 – 2020-11-26 (×2): 2 g via INTRAVENOUS
  Filled 2020-11-25 (×2): qty 100

## 2020-11-25 MED ORDER — ROCURONIUM BROMIDE 10 MG/ML (PF) SYRINGE
PREFILLED_SYRINGE | INTRAVENOUS | Status: DC | PRN
  Administered 2020-11-25: 60 mg via INTRAVENOUS
  Administered 2020-11-25: 40 mg via INTRAVENOUS

## 2020-11-25 MED ORDER — ROCURONIUM BROMIDE 10 MG/ML (PF) SYRINGE
PREFILLED_SYRINGE | INTRAVENOUS | Status: AC
  Filled 2020-11-25: qty 20

## 2020-11-25 MED ORDER — MEPERIDINE HCL 25 MG/ML IJ SOLN: 6.2500 mg | INTRAMUSCULAR | Status: DC | PRN

## 2020-11-25 MED ORDER — LABETALOL HCL 5 MG/ML IV SOLN
INTRAVENOUS | Status: AC
  Administered 2020-11-25: 10 mg
  Filled 2020-11-25: qty 4

## 2020-11-25 MED ORDER — ESMOLOL HCL 100 MG/10ML IV SOLN
INTRAVENOUS | Status: DC | PRN
  Administered 2020-11-25: 20 mg via INTRAVENOUS
  Administered 2020-11-25: 40 mg via INTRAVENOUS

## 2020-11-25 MED ORDER — FENTANYL CITRATE (PF) 100 MCG/2ML IJ SOLN
INTRAMUSCULAR | Status: AC
  Filled 2020-11-25: qty 2

## 2020-11-25 MED ORDER — POLYETHYLENE GLYCOL 3350 17 G PO PACK: 17.0000 g | PACK | Freq: Every day | ORAL | Status: DC | PRN

## 2020-11-25 MED ORDER — LABETALOL HCL 5 MG/ML IV SOLN
10.0000 mg | INTRAVENOUS | Status: DC | PRN
  Administered 2020-11-25: 20 mg via INTRAVENOUS
  Filled 2020-11-25: qty 4

## 2020-11-25 MED ORDER — FENTANYL CITRATE (PF) 250 MCG/5ML IJ SOLN
INTRAMUSCULAR | Status: AC
  Filled 2020-11-25: qty 5

## 2020-11-25 MED ORDER — HEPARIN SODIUM (PORCINE) 5000 UNIT/ML IJ SOLN: 5000.0000 [IU] | Freq: Three times a day (TID) | INTRAMUSCULAR | Status: DC

## 2020-11-25 MED ORDER — ORAL CARE MOUTH RINSE: 15.0000 mL | Freq: Once | OROMUCOSAL | Status: AC

## 2020-11-25 MED ORDER — ONDANSETRON HCL 4 MG/2ML IJ SOLN: 4.0000 mg | INTRAMUSCULAR | Status: DC | PRN

## 2020-11-25 MED ORDER — PHENYLEPHRINE HCL-NACL 20-0.9 MG/250ML-% IV SOLN
INTRAVENOUS | Status: DC | PRN
  Administered 2020-11-25: 20 ug/min via INTRAVENOUS

## 2020-11-25 MED ORDER — BACITRACIN ZINC 500 UNIT/GM EX OINT
TOPICAL_OINTMENT | CUTANEOUS | Status: AC
  Filled 2020-11-25: qty 28.35

## 2020-11-25 MED ORDER — SUGAMMADEX SODIUM 200 MG/2ML IV SOLN
INTRAVENOUS | Status: DC | PRN
  Administered 2020-11-25: 300 mg via INTRAVENOUS

## 2020-11-25 MED ORDER — SODIUM CHLORIDE 0.9 % IV SOLN: INTRAVENOUS | Status: DC | PRN

## 2020-11-25 MED ORDER — SODIUM CHLORIDE 0.9 % IV SOLN: INTRAVENOUS | Status: DC

## 2020-11-25 MED ORDER — LIDOCAINE 2% (20 MG/ML) 5 ML SYRINGE
INTRAMUSCULAR | Status: DC | PRN
  Administered 2020-11-25: 80 mg via INTRAVENOUS

## 2020-11-25 MED ORDER — MANNITOL 25 % IV SOLN
INTRAVENOUS | Status: DC | PRN
  Administered 2020-11-25: 37.5 g via INTRAVENOUS

## 2020-11-25 MED ORDER — LACTATED RINGERS IV SOLN: INTRAVENOUS | Status: DC

## 2020-11-25 MED ORDER — BACITRACIN ZINC 500 UNIT/GM EX OINT
TOPICAL_OINTMENT | CUTANEOUS | Status: DC | PRN
  Administered 2020-11-25: 1 via TOPICAL

## 2020-11-25 MED ORDER — ONDANSETRON HCL 4 MG/2ML IJ SOLN
INTRAMUSCULAR | Status: AC
  Filled 2020-11-25: qty 2

## 2020-11-25 MED ORDER — THROMBIN 5000 UNITS EX SOLR: OROMUCOSAL | Status: DC | PRN

## 2020-11-25 MED ORDER — ACETAMINOPHEN 500 MG PO TABS
ORAL_TABLET | ORAL | Status: AC
  Administered 2020-11-25: 1000 mg via ORAL
  Filled 2020-11-25: qty 2

## 2020-11-25 MED ORDER — CEFAZOLIN SODIUM-DEXTROSE 2-3 GM-%(50ML) IV SOLR
INTRAVENOUS | Status: DC | PRN
  Administered 2020-11-25: 2 g via INTRAVENOUS

## 2020-11-25 MED ORDER — THROMBIN 20000 UNITS EX SOLR: CUTANEOUS | Status: DC | PRN

## 2020-11-25 MED ORDER — ROCURONIUM BROMIDE 10 MG/ML (PF) SYRINGE
PREFILLED_SYRINGE | INTRAVENOUS | Status: AC
  Filled 2020-11-25: qty 10

## 2020-11-25 MED ORDER — OXYCODONE HCL 5 MG/5ML PO SOLN
5.0000 mg | Freq: Once | ORAL | Status: DC | PRN
Start: 2020-11-25 — End: 2020-11-25

## 2020-11-25 MED ORDER — DEXAMETHASONE SODIUM PHOSPHATE 10 MG/ML IJ SOLN
INTRAMUSCULAR | Status: AC
  Filled 2020-11-25: qty 1

## 2020-11-25 MED ORDER — PROMETHAZINE HCL 25 MG/ML IJ SOLN: 6.2500 mg | INTRAMUSCULAR | Status: DC | PRN

## 2020-11-25 MED ORDER — THROMBIN 20000 UNITS EX SOLR
CUTANEOUS | Status: AC
  Filled 2020-11-25: qty 20000

## 2020-11-25 MED ORDER — ACETAMINOPHEN 500 MG PO TABS: 1000.0000 mg | ORAL_TABLET | Freq: Once | ORAL | Status: AC

## 2020-11-25 MED ORDER — DOCUSATE SODIUM 100 MG PO CAPS
100.0000 mg | ORAL_CAPSULE | Freq: Two times a day (BID) | ORAL | Status: DC
  Administered 2020-11-26: 100 mg via ORAL
  Filled 2020-11-25 (×2): qty 1

## 2020-11-25 MED ORDER — LIDOCAINE-EPINEPHRINE 1 %-1:100000 IJ SOLN
INTRAMUSCULAR | Status: AC
  Filled 2020-11-25: qty 1

## 2020-11-25 MED ORDER — FENTANYL CITRATE (PF) 250 MCG/5ML IJ SOLN
INTRAMUSCULAR | Status: DC | PRN
  Administered 2020-11-25: 100 ug via INTRAVENOUS
  Administered 2020-11-25: 50 ug via INTRAVENOUS
  Administered 2020-11-25: 100 ug via INTRAVENOUS

## 2020-11-25 MED ORDER — SODIUM CHLORIDE 0.9 % IV SOLN
0.0500 ug/kg/min | INTRAVENOUS | Status: DC
  Administered 2020-11-25: .2 ug/kg/min via INTRAVENOUS
  Filled 2020-11-25 (×4): qty 5000

## 2020-11-25 MED ORDER — CHLORHEXIDINE GLUCONATE CLOTH 2 % EX PADS: 6.0000 | MEDICATED_PAD | Freq: Every day | CUTANEOUS | Status: DC

## 2020-11-25 MED ORDER — LIDOCAINE 2% (20 MG/ML) 5 ML SYRINGE
INTRAMUSCULAR | Status: AC
  Filled 2020-11-25: qty 5

## 2020-11-25 MED ORDER — ONDANSETRON HCL 4 MG PO TABS: 4.0000 mg | ORAL_TABLET | ORAL | Status: DC | PRN

## 2020-11-25 MED ORDER — LIDOCAINE-EPINEPHRINE 1 %-1:100000 IJ SOLN
INTRAMUSCULAR | Status: DC | PRN
  Administered 2020-11-25: 10 mL

## 2020-11-25 MED ORDER — HYDROMORPHONE HCL 1 MG/ML IJ SOLN: 0.5000 mg | INTRAMUSCULAR | Status: DC | PRN

## 2020-11-25 MED ORDER — FENTANYL CITRATE (PF) 100 MCG/2ML IJ SOLN
25.0000 ug | INTRAMUSCULAR | Status: DC | PRN
  Administered 2020-11-25 (×3): 50 ug via INTRAVENOUS

## 2020-11-25 MED ORDER — ONDANSETRON HCL 4 MG/2ML IJ SOLN
INTRAMUSCULAR | Status: DC | PRN
  Administered 2020-11-25: 4 mg via INTRAVENOUS

## 2020-11-25 MED ORDER — PROPOFOL 500 MG/50ML IV EMUL
INTRAVENOUS | Status: DC | PRN
Start: 2020-11-25 — End: 2020-11-25
  Administered 2020-11-25: 50 ug/kg/min via INTRAVENOUS

## 2020-11-25 MED ORDER — CHLORHEXIDINE GLUCONATE 0.12 % MT SOLN
15.0000 mL | Freq: Once | OROMUCOSAL | Status: AC
  Administered 2020-11-25: 15 mL via OROMUCOSAL
  Filled 2020-11-25: qty 15

## 2020-11-25 MED ORDER — 0.9 % SODIUM CHLORIDE (POUR BTL) OPTIME
TOPICAL | Status: DC | PRN
  Administered 2020-11-25: 3000 mL

## 2020-11-25 MED ORDER — CEFAZOLIN SODIUM-DEXTROSE 2-4 GM/100ML-% IV SOLN
INTRAVENOUS | Status: AC
  Filled 2020-11-25: qty 100

## 2020-11-25 MED ORDER — THROMBIN 5000 UNITS EX SOLR
CUTANEOUS | Status: AC
  Filled 2020-11-25: qty 5000

## 2020-11-25 MED ORDER — DEXAMETHASONE SODIUM PHOSPHATE 10 MG/ML IJ SOLN
INTRAMUSCULAR | Status: DC | PRN
  Administered 2020-11-25: 10 mg via INTRAVENOUS

## 2020-11-25 SURGICAL SUPPLY — 92 items
BAG COUNTER SPONGE SURGICOUNT (BAG) ×3 IMPLANT
BAG SURGICOUNT SPONGE COUNTING (BAG) ×1
BAND RUBBER #18 3X1/16 STRL (MISCELLANEOUS) IMPLANT
BENZOIN TINCTURE PRP APPL 2/3 (GAUZE/BANDAGES/DRESSINGS) IMPLANT
BLADE CLIPPER SURG (BLADE) ×4 IMPLANT
BLADE SAW GIGLI 16 STRL (MISCELLANEOUS) IMPLANT
BLADE SURG 15 STRL LF DISP TIS (BLADE) IMPLANT
BLADE SURG 15 STRL SS (BLADE)
BNDG GAUZE ELAST 4 BULKY (GAUZE/BANDAGES/DRESSINGS) IMPLANT
BNDG STRETCH 4X75 STRL LF (GAUZE/BANDAGES/DRESSINGS) IMPLANT
BUR ACORN 9.0 PRECISION (BURR) ×3 IMPLANT
BUR ACORN 9.0MM PRECISION (BURR) ×1
BUR ROUND FLUTED 4 SOFT TCH (BURR) ×3 IMPLANT
BUR ROUND FLUTED 4MM SOFT TCH (BURR) ×1
BUR SPIRAL ROUTER 2.3 (BUR) ×6 IMPLANT
BUR SPIRAL ROUTER 2.3MM (BUR) ×2
CANISTER SUCT 3000ML PPV (MISCELLANEOUS) ×8 IMPLANT
CATH VENTRIC 35X38 W/TROCAR LG (CATHETERS) IMPLANT
CLIP VESOCCLUDE MED 6/CT (CLIP) IMPLANT
CNTNR URN SCR LID CUP LEK RST (MISCELLANEOUS) ×2 IMPLANT
CONT SPEC 4OZ STRL OR WHT (MISCELLANEOUS) ×4
COVER MAYO STAND STRL (DRAPES) IMPLANT
DECANTER SPIKE VIAL GLASS SM (MISCELLANEOUS) ×4 IMPLANT
DRAIN SUBARACHNOID (WOUND CARE) IMPLANT
DRAPE HALF SHEET 40X57 (DRAPES) ×4 IMPLANT
DRAPE MICROSCOPE LEICA (MISCELLANEOUS) IMPLANT
DRAPE NEUROLOGICAL W/INCISE (DRAPES) ×4 IMPLANT
DRAPE STERI IOBAN 125X83 (DRAPES) IMPLANT
DRAPE SURG 17X23 STRL (DRAPES) IMPLANT
DRAPE WARM FLUID 44X44 (DRAPES) ×4 IMPLANT
DRSG ADAPTIC 3X8 NADH LF (GAUZE/BANDAGES/DRESSINGS) IMPLANT
DRSG TELFA 3X8 NADH (GAUZE/BANDAGES/DRESSINGS) IMPLANT
DURAPREP 6ML APPLICATOR 50/CS (WOUND CARE) ×4 IMPLANT
ELECT REM PT RETURN 9FT ADLT (ELECTROSURGICAL) ×4
ELECTRODE REM PT RTRN 9FT ADLT (ELECTROSURGICAL) ×2 IMPLANT
EVACUATOR 1/8 PVC DRAIN (DRAIN) IMPLANT
EVACUATOR SILICONE 100CC (DRAIN) IMPLANT
FORCEPS BIPOLAR SPETZLER 8 1.0 (NEUROSURGERY SUPPLIES) ×4 IMPLANT
GAUZE 4X4 16PLY ~~LOC~~+RFID DBL (SPONGE) ×8 IMPLANT
GAUZE SPONGE 4X4 12PLY STRL (GAUZE/BANDAGES/DRESSINGS) IMPLANT
GLOVE EXAM NITRILE LRG STRL (GLOVE) IMPLANT
GLOVE EXAM NITRILE XS STR PU (GLOVE) IMPLANT
GLOVE SURG ENC MOIS LTX SZ7 (GLOVE) IMPLANT
GLOVE SURG LTX SZ7.5 (GLOVE) ×4 IMPLANT
GLOVE SURG UNDER POLY LF SZ7 (GLOVE) IMPLANT
GLOVE SURG UNDER POLY LF SZ7.5 (GLOVE) ×4 IMPLANT
GOWN STRL REUS W/ TWL LRG LVL3 (GOWN DISPOSABLE) ×4 IMPLANT
GOWN STRL REUS W/ TWL XL LVL3 (GOWN DISPOSABLE) IMPLANT
GOWN STRL REUS W/TWL 2XL LVL3 (GOWN DISPOSABLE) IMPLANT
GOWN STRL REUS W/TWL LRG LVL3 (GOWN DISPOSABLE) ×8
GOWN STRL REUS W/TWL XL LVL3 (GOWN DISPOSABLE)
HEMOSTAT POWDER KIT SURGIFOAM (HEMOSTASIS) ×4 IMPLANT
HEMOSTAT SURGICEL 2X14 (HEMOSTASIS) ×8 IMPLANT
IV NS 1000ML (IV SOLUTION) ×4
IV NS 1000ML BAXH (IV SOLUTION) ×2 IMPLANT
KIT BASIN OR (CUSTOM PROCEDURE TRAY) ×4 IMPLANT
KIT DRAIN CSF ACCUDRAIN (MISCELLANEOUS) IMPLANT
KIT TURNOVER KIT B (KITS) ×4 IMPLANT
MARKER SPHERE PSV REFLC 13MM (MARKER) ×12 IMPLANT
NEEDLE HYPO 22GX1.5 SAFETY (NEEDLE) ×4 IMPLANT
NEEDLE SPNL 18GX3.5 QUINCKE PK (NEEDLE) IMPLANT
NS IRRIG 1000ML POUR BTL (IV SOLUTION) ×12 IMPLANT
PACK BATTERY CMF DISP FOR DVR (ORTHOPEDIC DISPOSABLE SUPPLIES) ×4 IMPLANT
PACK CRANIOTOMY CUSTOM (CUSTOM PROCEDURE TRAY) ×4 IMPLANT
PATTIES SURGICAL .25X.25 (GAUZE/BANDAGES/DRESSINGS) IMPLANT
PATTIES SURGICAL .5 X.5 (GAUZE/BANDAGES/DRESSINGS) IMPLANT
PATTIES SURGICAL .5 X3 (DISPOSABLE) IMPLANT
PATTIES SURGICAL 1/4 X 3 (GAUZE/BANDAGES/DRESSINGS) IMPLANT
PATTIES SURGICAL 1X1 (DISPOSABLE) IMPLANT
PIN MAYFIELD SKULL DISP (PIN) ×4 IMPLANT
PLATE CRANIAL SHUNT 14 (Plate) ×8 IMPLANT
PLATE DOUBLE Y CMF 6H (Plate) ×4 IMPLANT
SCREW UNIII AXS SD 1.5X4 (Screw) ×68 IMPLANT
SPECIMEN JAR SMALL (MISCELLANEOUS) IMPLANT
SPONGE NEURO XRAY DETECT 1X3 (DISPOSABLE) IMPLANT
SPONGE SURGIFOAM ABS GEL 100 (HEMOSTASIS) ×4 IMPLANT
STAPLER VISISTAT 35W (STAPLE) ×4 IMPLANT
SUT ETHILON 3 0 FSL (SUTURE) IMPLANT
SUT ETHILON 3 0 PS 1 (SUTURE) IMPLANT
SUT MNCRL AB 3-0 PS2 18 (SUTURE) ×4 IMPLANT
SUT MON AB 3-0 SH 27 (SUTURE)
SUT MON AB 3-0 SH27 (SUTURE) IMPLANT
SUT NURALON 4 0 TR CR/8 (SUTURE) ×4 IMPLANT
SUT SILK 0 TIES 10X30 (SUTURE) IMPLANT
SUT VIC AB 2-0 CP2 18 (SUTURE) ×8 IMPLANT
TOWEL GREEN STERILE (TOWEL DISPOSABLE) ×4 IMPLANT
TOWEL GREEN STERILE FF (TOWEL DISPOSABLE) ×4 IMPLANT
TRAY FOLEY MTR SLVR 16FR STAT (SET/KITS/TRAYS/PACK) ×4 IMPLANT
TUBE CONNECTING 12'X1/4 (SUCTIONS) ×1
TUBE CONNECTING 12X1/4 (SUCTIONS) ×3 IMPLANT
UNDERPAD 30X36 HEAVY ABSORB (UNDERPADS AND DIAPERS) ×4 IMPLANT
WATER STERILE IRR 1000ML POUR (IV SOLUTION) ×4 IMPLANT

## 2020-11-25 NOTE — Progress Notes (Signed)
Neurosurgery Service Progress Note  Subjective: No acute events overnight, no paroxysmal speech / strength issues   Objective: Vitals:   11/24/20 1755 11/24/20 2030 11/25/20 0001 11/25/20 0408  BP: (!) 149/87 131/70 124/62 (!) 150/68  Pulse: 71 90 69 62  Resp: 16  18 16   Temp: 97.8 F (36.6 C) 98.3 F (36.8 C) 97.8 F (36.6 C) 97.9 F (36.6 C)  TempSrc: Oral Oral Oral Oral  SpO2: 100% 97% 98% 98%  Weight:      Height:        Physical Exam: AOx3, PERRL, EOMI, FS, TM, Strength 5/5 x4, SILTx4, no drift  Assessment & Plan: 65 y.o. 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  -OR today for resection, 4N ICU post-op on my service  Judith Part  11/25/20 7:39 AM

## 2020-11-25 NOTE — Op Note (Signed)
PATIENT: Erika Mitchell  DAY OF SURGERY: 11/25/20   PRE-OPERATIVE DIAGNOSIS:  Brain tumor   POST-OPERATIVE DIAGNOSIS:  Same   PROCEDURE:  Right craniotomy for tumor resection, use of operating microscope and intra-operative frameless stereotactic navigation   SURGEON:  Surgeon(s) and Role:    Judith Part, MD - Primary   ANESTHESIA: ETGA   BRIEF HISTORY: This is a 65 year old woman who presented with some episodic right sided movements and clumsiness with changes in speech concerning for simple partial seizures, the patient is partially left handed. MRI showed a right frontal enhancing mass, I therefore recommended surgical resection. This was discussed with the patient as well as risks, benefits, and alternatives and wished to proceed with surgery.   OPERATIVE DETAIL: The patient was taken to the operating room and placed on the OR table in the supine position. A formal time out was performed with two patient identifiers and confirmed the operative site. Anesthesia was induced by the anesthesia team. The [place] head holder was applied to the head and a registration array was attached to the [place]. This was co-registered with the patient's preoperative imaging, the fit appeared to be acceptable. Using frameless stereotaxy, the operative trajectory was planned and the incision was marked. Hair was clipped with surgical clippers over the incision and the area was then prepped and draped in a sterile fashion.  A linear incision was placed in the right frontal region, planned with stereotaxy to be roughly centered over the mass. Soft tissues were dissected and retracted. A craniotomy flap was turned in the usual fashion and the dura was opened and flapped medially towards the sinus. The tumor was immediately evidence on the surface and the location was confirmed with frameless stereotaxy. It was then dissected circumferentially, removed en bloc, and sent to pathology for further analysis.  The margins were examined visually as well as with frameless stereotaxy, hemostasis was obtained and confirmed, the dura was closed with suture, the bone flap was replaced with titanium plates and screws.   The wound was copiously irrigated, all instrument and sponge counts were correct, the incision was then closed in layers. The patient was then returned to anesthesia for emergence. No apparent complications at the completion of the procedure.   EBL:  150mL   DRAINS: none   SPECIMENS: Right frontal brain tumor   Judith Part, MD 11/25/20 2:42 PM

## 2020-11-25 NOTE — Anesthesia Procedure Notes (Signed)
Arterial Line Insertion Start/End10/19/2022 6:27 PM, 11/25/2020 6:32 PM Performed by: Carolan Clines, CRNA, CRNA  Patient location: Pre-op. Preanesthetic checklist: patient identified, IV checked, site marked, risks and benefits discussed, surgical consent, monitors and equipment checked, pre-op evaluation, timeout performed and anesthesia consent Lidocaine 1% used for infiltration Left, radial was placed Catheter size: 20 G Hand hygiene performed  and maximum sterile barriers used   Attempts: 1 Procedure performed without using ultrasound guided technique. Following insertion, dressing applied and Biopatch. Post procedure assessment: normal and unchanged  Patient tolerated the procedure well with no immediate complications.

## 2020-11-25 NOTE — Plan of Care (Signed)
  Problem: Coping: Goal: Level of anxiety will decrease 11/25/2020 1231 by Bess Harvest, RN Outcome: Progressing 11/25/2020 1229 by Bess Harvest, RN Outcome: Progressing   Problem: Pain Managment: Goal: General experience of comfort will improve 11/25/2020 1231 by Bess Harvest, RN Outcome: Progressing 11/25/2020 1229 by Bess Harvest, RN Outcome: Progressing   Problem: Skin Integrity: Goal: Risk for impaired skin integrity will decrease 11/25/2020 1231 by Bess Harvest, RN Outcome: Progressing 11/25/2020 1229 by Bess Harvest, RN Outcome: Progressing   Problem: Education: Goal: Knowledge of the prescribed therapeutic regimen will improve Outcome: Progressing   Problem: Activity: Goal: Ability to tolerate increased activity will improve Outcome: Progressing   Problem: Health Behavior/Discharge Planning: Goal: Identification of resources available to assist in meeting health care needs will improve Outcome: Progressing   Problem: Nutrition: Goal: Maintenance of adequate nutrition will improve Outcome: Progressing   Problem: Clinical Measurements: Goal: Complications related to the disease process, condition or treatment will be avoided or minimized Outcome: Progressing   Problem: Respiratory: Goal: Will regain and/or maintain adequate ventilation Outcome: Progressing   Problem: Skin Integrity: Goal: Demonstration of wound healing without infection will improve Outcome: Progressing

## 2020-11-25 NOTE — Progress Notes (Signed)
Patient left unit for OR, accompanied by family, personal items removed. CHG bath given. Report called to [facility]. Tele removed during transport per MD order.

## 2020-11-25 NOTE — Anesthesia Postprocedure Evaluation (Signed)
Anesthesia Post Note  Patient: Erika Mitchell  Procedure(s) Performed: CRANIOTOMY FOR  TUMOR RESECTION WITH BRAINLAB (Right: Head) APPLICATION OF CRANIAL NAVIGATION (Right)     Patient location during evaluation: PACU Anesthesia Type: General Level of consciousness: awake and alert Pain management: pain level controlled Vital Signs Assessment: post-procedure vital signs reviewed and stable Respiratory status: spontaneous breathing, nonlabored ventilation, respiratory function stable and patient connected to nasal cannula oxygen Cardiovascular status: blood pressure returned to baseline and stable Postop Assessment: no apparent nausea or vomiting Anesthetic complications: no   No notable events documented.  Last Vitals:  Vitals:   11/25/20 2105 11/25/20 2120  BP: (!) 159/77 (!) 143/76  Pulse: 77 74  Resp: 16 19  Temp:    SpO2: 96% 97%    Last Pain:  Vitals:   11/25/20 2105  TempSrc:   PainSc: 5     LLE Motor Response: Purposeful movement (11/25/20 2130)   RLE Motor Response: Purposeful movement (11/25/20 2130)        Catalina Gravel

## 2020-11-25 NOTE — Anesthesia Preprocedure Evaluation (Addendum)
Anesthesia Evaluation  Patient identified by MRN, date of birth, ID band Patient awake    Reviewed: Allergy & Precautions, NPO status , Patient's Chart, lab work & pertinent test results  Airway Mallampati: II  TM Distance: >3 FB Neck ROM: Full    Dental  (+) Teeth Intact, Dental Advisory Given, Chipped,    Pulmonary former smoker,    Pulmonary exam normal breath sounds clear to auscultation       Cardiovascular hypertension, Pt. on medications Normal cardiovascular exam Rhythm:Regular Rate:Normal     Neuro/Psych Seizures -,  Vertigo intermittent confusion, forgetfulness and occasional involuntary movements with her head turning to the left argatroban: Work-up in the ED shows a brain mass in the right frontal lobe  Neuromuscular disease negative psych ROS   GI/Hepatic negative GI ROS, Neg liver ROS,   Endo/Other  negative endocrine ROS  Renal/GU negative Renal ROS     Musculoskeletal negative musculoskeletal ROS (+)   Abdominal   Peds  Hematology negative hematology ROS (+)   Anesthesia Other Findings Day of surgery medications reviewed with the patient.  Reproductive/Obstetrics                           Anesthesia Physical Anesthesia Plan  ASA: 3  Anesthesia Plan: General   Post-op Pain Management:    Induction: Intravenous  PONV Risk Score and Plan: 3 and Ondansetron, Dexamethasone, Treatment may vary due to age or medical condition and Propofol infusion  Airway Management Planned: Oral ETT  Additional Equipment: Arterial line  Intra-op Plan:   Post-operative Plan: Possible Post-op intubation/ventilation  Informed Consent: I have reviewed the patients History and Physical, chart, labs and discussed the procedure including the risks, benefits and alternatives for the proposed anesthesia with the patient or authorized representative who has indicated his/her understanding and  acceptance.     Dental advisory given  Plan Discussed with: CRNA  Anesthesia Plan Comments:        Anesthesia Quick Evaluation

## 2020-11-25 NOTE — Progress Notes (Signed)
OT Cancellation Note  Patient Details Name: LASHAWNTA BURGERT MRN: [id number] DOB: 03-15-1955   Cancelled Treatment:    Reason Eval/Treat Not Completed: Patient not medically ready. Per chart review, pt having Brain Mass resection today and transferring to 4N ICU after procedure. OT will follow up once pt is medically stable.   Juvon Teater H., OTR/L Acute Rehabilitation  Sherene Plancarte Elane Yolanda Bonine 11/25/2020, 9:28 AM

## 2020-11-25 NOTE — Plan of Care (Signed)
  Problem: Coping: Goal: Level of anxiety will decrease Outcome: Progressing   Problem: Pain Managment: Goal: General experience of comfort will improve Outcome: Progressing   Problem: Skin Integrity: Goal: Risk for impaired skin integrity will decrease Outcome: Progressing   

## 2020-11-25 NOTE — Transfer of Care (Signed)
Immediate Anesthesia Transfer of Care Note  Patient: Erika Mitchell  Procedure(s) Performed: CRANIOTOMY FOR  TUMOR RESECTION WITH BRAINLAB (Right: Head) APPLICATION OF CRANIAL NAVIGATION (Right)  Patient Location: PACU  Anesthesia Type:General  Level of Consciousness: awake, alert  and oriented  Airway & Oxygen Therapy: Patient Spontanous Breathing and Patient connected to nasal cannula oxygen  Post-op Assessment: Report given to RN, Post -op Vital signs reviewed and stable and Patient moving all extremities  Post vital signs: Reviewed and stable  Last Vitals:  Vitals Value Taken Time  BP 166/135 11/25/20 2033  Temp    Pulse 101 11/25/20 2035  Resp 21 11/25/20 2035  SpO2 100 % 11/25/20 2035  Vitals shown include unvalidated device data.  Last Pain:  Vitals:   11/25/20 1757  TempSrc: Oral  PainSc:          Complications: No notable events documented.

## 2020-11-25 NOTE — Anesthesia Procedure Notes (Signed)
Procedure Name: Intubation Date/Time: 11/25/2020 6:30 PM Performed by: Dorthea Cove, CRNA Pre-anesthesia Checklist: Patient identified, Emergency Drugs available, Suction available and Patient being monitored Patient Re-evaluated:Patient Re-evaluated prior to induction Oxygen Delivery Method: Circle system utilized Preoxygenation: Pre-oxygenation with 100% oxygen Induction Type: IV induction Ventilation: Mask ventilation without difficulty Laryngoscope Size: Mac and 3 Grade View: Grade I Tube type: Oral Tube size: 7.0 mm Number of attempts: 1 Airway Equipment and Method: Stylet and Oral airway Placement Confirmation: ETT inserted through vocal cords under direct vision, positive ETCO2 and breath sounds checked- equal and bilateral Secured at: 21 cm Tube secured with: Tape Dental Injury: Teeth and Oropharynx as per pre-operative assessment

## 2020-11-25 NOTE — Progress Notes (Signed)
PROGRESS NOTE  Erika Mitchell ZSW:[id number] DOB: Apr 22, 1955 DOA: 11/23/2020 PCP: Ginger Organ., MD   LOS: 2 days   Brief Narrative / Interim history:  65 year old female with history of vestibular neuritis, hypertension, vertigo comes into the hospital with intermittent confusion, forgetfulness and occasional involuntary movements with her head turning to the left argatroban.  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  Neurology and neurosurgery were consulted, recommending Keppra and Decadron and admission to [facility] from [facility], with tentative plans per chart review for surgical intervention on Wednesday  Subjective / 24h Interval events:  Denies any complaints this morning, no acute issues, no speech problems, no focal deficits.  Assessment & Plan:  Brain mass -Surgery consult greatly appreciated, neuro/oncology consult as well greatly appreciated . -Plan 2 OR today for resection  -Continue with steroids  -Continue with Keppra for seizure prophylaxis  patient awaiting transfer to [facility], neurology and neurosurgery consulted with plans for OR on Wednesday 10/19.  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  Hypokalemia-repleted  Scheduled Meds:  dexamethasone (DECADRON) injection  4 mg Intravenous Q8H   sodium chloride flush  3 mL Intravenous Q12H   Continuous Infusions:  sodium chloride     levETIRAcetam 500 mg (11/25/20 0838)   PRN Meds:.sodium chloride, acetaminophen **OR** acetaminophen, ALPRAZolam, HYDROcodone-acetaminophen, sodium chloride flush  Diet Orders (From admission, onward)     Start     Ordered   11/25/20 0001  Diet NPO time specified  Diet effective midnight        11/24/20 1737            DVT prophylaxis: SCD's Start: 11/25/20  0831 SCDs Start: 11/24/20 0353     Code Status: Full Code  Family Communication: no family at bedside   Status is: Inpatient  Remains inpatient appropriate because: Severity of illness  Level of care: Telemetry Medical  Consultants:  Neurology Neurosurgery Neurooncology  Procedures:  none  Microbiology  none  Antimicrobials: none    Objective: Vitals:   11/25/20 0001 11/25/20 0408 11/25/20 0803 11/25/20 1147  BP: 124/62 (!) 150/68 140/79 (!) 149/67  Pulse: 69 62 67 67  Resp: 18 16 16 16   Temp: 97.8 F (36.6 C) 97.9 F (36.6 C) 98.3 F (36.8 C) 98.4 F (36.9 C)  TempSrc: Oral Oral Oral Oral  SpO2: 98% 98% 98%   Weight:      Height:        Intake/Output Summary (Last 24 hours) at 11/25/2020 1336 Last data filed at 11/25/2020 3220 Gross per 24 hour  Intake 191.45 ml  Output --  Net 191.45 ml   Filed Weights   11/23/20 1547  Weight: 79.4 kg    Examination:   Awake Alert, Oriented X 3, No new F.N deficits, Normal affect Symmetrical Chest wall movement, Good air movement bilaterally, CTAB RRR,No Gallops,Rubs or new Murmurs, No Parasternal Heave +ve B.Sounds, Abd Soft, No tenderness, No rebound - guarding or rigidity. No Cyanosis, Clubbing or edema, No new Rash or bruise      Data Reviewed: I have independently reviewed following labs and imaging studies   CBC: Recent Labs  Lab 11/23/20 1608 11/24/20 0547  WBC 4.1 4.2  NEUTROABS 2.3 3.3  HGB 15.0 15.0  HCT 44.4 45.1  MCV 95.5 95.8  PLT 184 175   Basic Metabolic Panel: Recent Labs  Lab 11/23/20 1609 11/24/20 0620 11/24/20 1756  NA 138 139  --   K 3.2* 4.1  --   CL 105 111  --   CO2 26 21*  --   GLUCOSE 118* 153*  --   BUN 8 6*  --   CREATININE 0.68 0.57  --   CALCIUM 9.2 8.7*  --   MG  --  1.9 2.2  PHOS  --  2.9  --    Liver Function Tests: Recent Labs  Lab 11/23/20 1609 11/24/20 0620  AST 23 25  ALT 25 23  ALKPHOS 58 56  BILITOT 0.5 0.7  PROT 7.1 6.7  ALBUMIN 4.6  4.3   Coagulation Profile: No results for input(s): INR, PROTIME in the last 168 hours. HbA1C: No results for input(s): HGBA1C in the last 72 hours. CBG: Recent Labs  Lab 11/23/20 1658  GLUCAP 96    Recent Results (from the past 240 hour(s))  Resp Panel by RT-PCR (Flu A&B, Covid) Nasopharyngeal Swab     Status: None   Collection Time: 11/23/20  7:51 PM   Specimen: Nasopharyngeal Swab; Nasopharyngeal(NP) swabs in vial transport medium  Result Value Ref Range Status   SARS Coronavirus 2 by RT PCR NEGATIVE NEGATIVE Final    Comment: (NOTE) SARS-CoV-2 target nucleic acids are NOT DETECTED.  The SARS-CoV-2 RNA is generally detectable in upper respiratory specimens during the acute phase of infection. The lowest concentration of SARS-CoV-2 viral copies this assay can detect is 138 copies/mL. A negative result does not preclude SARS-Cov-2 infection and should not be used as the sole basis for treatment or other patient management decisions. A negative result may occur with  improper specimen collection/handling, submission of specimen other than nasopharyngeal swab, presence of viral mutation(s) within the areas targeted by this assay, and inadequate number of viral copies(<138 copies/mL). A negative result must be combined with clinical observations, patient history, and epidemiological information. The expected result is Negative.  Fact Sheet for Patients:  EntrepreneurPulse.com.au  Fact Sheet for Healthcare Providers:  IncredibleEmployment.be  This test is no t yet approved or cleared by the [place] FDA and  has been authorized for detection and/or diagnosis of SARS-CoV-2 by FDA under an Emergency Use Authorization (EUA). This EUA will remain  in effect (meaning this test can be used) for the duration of the COVID-19 declaration under Section 564(b)(1) of the Act, 21 U.S.C.section 360bbb-3(b)(1), unless the authorization is terminated   or revoked sooner.       Influenza A by PCR NEGATIVE NEGATIVE Final   Influenza B by PCR NEGATIVE NEGATIVE Final    Comment: (NOTE) The Xpert Xpress SARS-CoV-2/FLU/RSV plus assay is intended as an aid in the diagnosis of influenza from Nasopharyngeal swab specimens and should not be used as a sole basis for treatment. Nasal washings and aspirates are unacceptable for Xpert Xpress SARS-CoV-2/FLU/RSV testing.  Fact Sheet for Patients: EntrepreneurPulse.com.au  Fact Sheet for Healthcare Providers: IncredibleEmployment.be  This test is not yet approved or cleared by the [place] FDA and has been authorized for detection and/or diagnosis of SARS-CoV-2 by FDA under an Emergency Use Authorization (EUA). This EUA will remain in effect (meaning this test can be used) for the duration of the COVID-19 declaration under Section 564(b)(1) of the Act, 21 U.S.C. section 360bbb-3(b)(1), unless the authorization is terminated or revoked.  Performed at [facility], [place] [address] [place], [place] [address]  Radiology Studies: No results found.   Phillips Climes MD [facility]  Between 7 am - 7 pm I am available, please contact me via Amion (for emergencies) or Securechat (non urgent messages)  Between 7 pm - 7 am I am not available, please contact night coverage MD/APP via Amion

## 2020-11-26 ENCOUNTER — Encounter (HOSPITAL_COMMUNITY): Payer: Self-pay | Admitting: Neurological Surgery

## 2020-11-26 ENCOUNTER — Inpatient Hospital Stay (HOSPITAL_COMMUNITY): Payer: BC Managed Care – PPO

## 2020-11-26 DIAGNOSIS — D496 Neoplasm of unspecified behavior of brain: Secondary | ICD-10-CM | POA: Diagnosis not present

## 2020-11-26 DIAGNOSIS — H8122 Vestibular neuronitis, left ear: Secondary | ICD-10-CM | POA: Diagnosis not present

## 2020-11-26 LAB — BASIC METABOLIC PANEL
Anion gap: 9 (ref 5–15)
BUN: 9 mg/dL (ref 8–23)
CO2: 20 mmol/L — ABNORMAL LOW (ref 22–32)
Calcium: 8.7 mg/dL — ABNORMAL LOW (ref 8.9–10.3)
Chloride: 111 mmol/L (ref 98–111)
Creatinine, Ser: 0.67 mg/dL (ref 0.44–1.00)
GFR, Estimated: >60 mL/min (ref >60)
Glucose, Bld: 144 mg/dL — ABNORMAL HIGH (ref 70–99)
Potassium: 3.9 mmol/L (ref 3.5–5.1)
Sodium: 140 mmol/L (ref 135–145)

## 2020-11-26 LAB — CREATININE, SERUM
Creatinine, Ser: 0.67 mg/dL (ref 0.44–1.00)
GFR, Estimated: >60 mL/min (ref >60)

## 2020-11-26 LAB — CBC
HCT: 38.9 % (ref 36.0–46.0)
Hemoglobin: 12.8 g/dL (ref 12.0–15.0)
MCH: 31.5 pg (ref 26.0–34.0)
MCHC: 32.9 g/dL (ref 30.0–36.0)
MCV: 95.8 fL (ref 80.0–100.0)
Platelets: 175 10*3/uL (ref 150–400)
RBC: 4.06 MIL/uL (ref 3.87–5.11)
RDW: 13.2 % (ref 11.5–15.5)
WBC: 13.5 10*3/uL — ABNORMAL HIGH (ref 4.0–10.5)
nRBC: 0 % (ref 0.0–0.2)

## 2020-11-26 MED ORDER — LEVETIRACETAM 500 MG PO TABS: 500.0000 mg | ORAL_TABLET | Freq: Two times a day (BID) | ORAL | 5 refills | Status: DC

## 2020-11-26 MED ORDER — GADOBUTROL 1 MMOL/ML IV SOLN
8.0000 mL | Freq: Once | INTRAVENOUS | Status: AC | PRN
  Administered 2020-11-26: 8 mL via INTRAVENOUS

## 2020-11-26 MED ORDER — HYDROCODONE-ACETAMINOPHEN 5-325 MG PO TABS: 1.0000 | ORAL_TABLET | ORAL | 0 refills | Status: DC | PRN

## 2020-11-26 NOTE — Progress Notes (Signed)
PROGRESS NOTE  Erika Mitchell:[id number] DOB: [birth year] DOA: 11/23/2020 PCP: Ginger Organ., MD   LOS: 3 days   Brief Narrative / Interim history:  64 year old female with history of vestibular neuritis, hypertension, vertigo comes into the hospital with intermittent confusion, forgetfulness and occasional involuntary movements with her head turning to the left argatroban.  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  Neurology and neurosurgery were consulted, recommending Keppra and Decadron and admission to [facility] from [facility], 10/19 s/p R crani and resection. Post-op MRI with gross total resection.   Subjective / 24h Interval events:  No complaints this morning, no significant events overnight, no focal deficits, or speech problems.  Assessment & Plan:  Brain mass -Management per neurosurgical team, initially on Decadron and Keppra, Decadron has been stopped yesterday after surgery, she remains on Keppra. - 10/19 s/p R crani and resection. Post-op MRI with gross total resection.  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  Hypokalemia-repleted  Scheduled Meds:  Chlorhexidine Gluconate Cloth  6 each Topical Daily   docusate sodium  100 mg Oral BID   [START ON 11/27/2020] heparin injection (subcutaneous)  5,000 Units Subcutaneous Q8H   sodium chloride flush  3 mL Intravenous Q12H   Continuous Infusions:  sodium chloride     levETIRAcetam Stopped (11/26/20 0757)   PRN Meds:.sodium chloride, acetaminophen **OR** acetaminophen, ALPRAZolam, HYDROcodone-acetaminophen, HYDROmorphone (DILAUDID) injection, labetalol, ondansetron **OR** ondansetron (ZOFRAN) IV, polyethylene glycol, promethazine, sodium chloride flush  Diet Orders (From admission, onward)     Start     Ordered   11/25/20 2312  Diet regular Room service appropriate? Yes; Fluid consistency:  Thin  Diet effective now       Question Answer Comment  Room service appropriate? Yes   Fluid consistency: Thin      11/25/20 2312            DVT prophylaxis: SCD     Code Status: Full Code  Family Communication: no family at bedside   Status is: Inpatient   Level of care: ICU  Consultants:  Neurology Neurosurgery Neuro oncology  Procedures:  10/19 s/p R crani and resection. Post-op MRI with gross total resection.    Microbiology  none  Antimicrobials: none    Objective: Vitals:   11/26/20 0700 11/26/20 0800 11/26/20 0900 11/26/20 1000  BP: 133/61 126/63 108/74 126/64  Pulse: 62 (!) 57 (!) 56 66  Resp: 20 19 (!) 22 15  Temp: (!) 97.4 F (36.3 C)     TempSrc:      SpO2: 95% 96% 97% 96%  Weight:      Height:        Intake/Output Summary (Last 24 hours) at 11/26/2020 1124 Last data filed at 11/26/2020 0800 Gross per 24 hour  Intake 2161.55 ml  Output 1050 ml  Net 1111.55 ml   Filed Weights   11/23/20 1547  Weight: 79.4 kg    Examination:   Awake Alert, Oriented X 3, No new F.N deficits, Normal affect, surgical wound with staples at scalp with no discharge or bleed. Symmetrical Chest wall movement, Good air movement bilaterally, CTAB RRR,No Gallops,Rubs or new Murmurs, No Parasternal Heave +ve B.Sounds, Abd Soft, No tenderness, No rebound - guarding or rigidity. No Cyanosis, Clubbing or edema, No new Rash or bruise       Data Reviewed: I have independently reviewed following labs  and imaging studies   CBC: Recent Labs  Lab 11/23/20 1608 11/24/20 0547 11/25/20 2257 11/26/20 0346  WBC 4.1 4.2 14.5* 13.5*  NEUTROABS 2.3 3.3  --   --   HGB 15.0 15.0 13.1 12.8  HCT 44.4 45.1 39.8 38.9  MCV 95.5 95.8 95.2 95.8  PLT 184 158 195 947   Basic Metabolic Panel: Recent Labs  Lab 11/23/20 1609 11/24/20 0620 11/24/20 1756 11/25/20 2257 11/26/20 0346  NA 138 139  --   --  140  K 3.2* 4.1  --   --  3.9  CL 105 111  --   --  111  CO2  26 21*  --   --  20*  GLUCOSE 118* 153*  --   --  144*  BUN 8 6*  --   --  9  CREATININE 0.68 0.57  --  0.67 0.67  CALCIUM 9.2 8.7*  --   --  8.7*  MG  --  1.9 2.2  --   --   PHOS  --  2.9  --   --   --    Liver Function Tests: Recent Labs  Lab 11/23/20 1609 11/24/20 0620  AST 23 25  ALT 25 23  ALKPHOS 58 56  BILITOT 0.5 0.7  PROT 7.1 6.7  ALBUMIN 4.6 4.3   Coagulation Profile: No results for input(s): INR, PROTIME in the last 168 hours. HbA1C: No results for input(s): HGBA1C in the last 72 hours. CBG: Recent Labs  Lab 11/23/20 1658  GLUCAP 96    Recent Results (from the past 240 hour(s))  Resp Panel by RT-PCR (Flu A&B, Covid) Nasopharyngeal Swab     Status: None   Collection Time: 11/23/20  7:51 PM   Specimen: Nasopharyngeal Swab; Nasopharyngeal(NP) swabs in vial transport medium  Result Value Ref Range Status   SARS Coronavirus 2 by RT PCR NEGATIVE NEGATIVE Final    Comment: (NOTE) SARS-CoV-2 target nucleic acids are NOT DETECTED.  The SARS-CoV-2 RNA is generally detectable in upper respiratory specimens during the acute phase of infection. The lowest concentration of SARS-CoV-2 viral copies this assay can detect is 138 copies/mL. A negative result does not preclude SARS-Cov-2 infection and should not be used as the sole basis for treatment or other patient management decisions. A negative result may occur with  improper specimen collection/handling, submission of specimen other than nasopharyngeal swab, presence of viral mutation(s) within the areas targeted by this assay, and inadequate number of viral copies(<138 copies/mL). A negative result must be combined with clinical observations, patient history, and epidemiological information. The expected result is Negative.  Fact Sheet for Patients:  EntrepreneurPulse.com.au  Fact Sheet for Healthcare Providers:  IncredibleEmployment.be  This test is no t yet approved or  cleared by the [place] FDA and  has been authorized for detection and/or diagnosis of SARS-CoV-2 by FDA under an Emergency Use Authorization (EUA). This EUA will remain  in effect (meaning this test can be used) for the duration of the COVID-19 declaration under Section 564(b)(1) of the Act, 21 U.S.C.section 360bbb-3(b)(1), unless the authorization is terminated  or revoked sooner.       Influenza A by PCR NEGATIVE NEGATIVE Final   Influenza B by PCR NEGATIVE NEGATIVE Final    Comment: (NOTE) The Xpert Xpress SARS-CoV-2/FLU/RSV plus assay is intended as an aid in the diagnosis of influenza from Nasopharyngeal swab specimens and should not be used as a sole basis for treatment. Nasal washings and aspirates are unacceptable  for Xpert Xpress SARS-CoV-2/FLU/RSV testing.  Fact Sheet for Patients: EntrepreneurPulse.com.au  Fact Sheet for Healthcare Providers: IncredibleEmployment.be  This test is not yet approved or cleared by the [place] FDA and has been authorized for detection and/or diagnosis of SARS-CoV-2 by FDA under an Emergency Use Authorization (EUA). This EUA will remain in effect (meaning this test can be used) for the duration of the COVID-19 declaration under Section 564(b)(1) of the Act, 21 U.S.C. section 360bbb-3(b)(1), unless the authorization is terminated or revoked.  Performed at [facility], [place] [address], [place], [place] [address]      Radiology Studies: MR BRAIN W WO CONTRAST  Result Date: 11/26/2020 CLINICAL DATA:  CNS neoplasm, status post craniotomy for tumor resection EXAM: MRI HEAD WITHOUT AND WITH CONTRAST TECHNIQUE: Multiplanar, multiecho pulse sequences of the brain and surrounding structures were obtained without and with intravenous contrast. CONTRAST:  25mL GADAVIST GADOBUTROL 1 MMOL/ML IV SOLN COMPARISON:  11/23/2020 FINDINGS: Brain: Status post interval right frontal craniotomy  with subjacent resection cavity in the right frontal lobe, which is filled with fluid and air. Some T1 hyperintense material around the periphery, likely postoperative blood products. Additional enhancement at the superior aspect of the resection cavity (series 18, image 40), may be postoperative or could represent residual tumor. Small fluid collection subjacent to the craniotomy flap, measuring to 7 mm, with air anterior to the right frontal lobe, not unexpected postoperatively. Slightly decreased surrounding T2 hyperintense signal in the right frontal lobe. Restricted diffusion about the resection cavity is not unexpected postoperatively. No midline shift. No hydrocephalus. Vascular: Normal flow voids. Skull and upper cervical spine: Status post right frontal craniotomy. Otherwise normal marrow signal. Sinuses/Orbits: Negative. Other: Fluid in the subcutaneous tissues overlying the craniotomy flap, not unexpected postoperatively. IMPRESSION: Expected postoperative appearance, status post right frontal craniotomy and resection of subjacent mass. Minimal enhancement along the superior aspect of the resection cavity may be postoperative or could represent residual tumor. Attention on follow-up. Electronically Signed   By: Merilyn Baba M.D.   On: 11/26/2020 01:33     Phillips Climes MD [facility]  Between 7 am - 7 pm I am available, please contact me via Amion (for emergencies) or Securechat (non urgent messages)  Between 7 pm - 7 am I am not available, please contact night coverage MD/APP via Amion

## 2020-11-26 NOTE — Progress Notes (Signed)
Neurosurgery Service Progress Note  Subjective: No acute events overnight, no paroxysmal speech / strength issues   Objective: Vitals:   11/26/20 0700 11/26/20 0800 11/26/20 0900 11/26/20 1000  BP: 133/61 126/63 108/74 126/64  Pulse: 62 (!) 57 (!) 56 66  Resp: 20 19 (!) 22 15  Temp: (!) 97.4 F (36.3 C)     TempSrc:      SpO2: 95% 96% 97% 96%  Weight:      Height:        Physical Exam: AOx3, PERRL, EOMI, FS, TM, Strength 5/5 x4, SILTx4, no drift  Assessment & Plan: 65 y.o. 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 Post-op MRI with gross total resection.  -likely discharge home today pending PT evaluation, otherwise will transfer to a regular nursing floor  Judith Part  11/26/20 11:09 AM

## 2020-11-26 NOTE — Progress Notes (Addendum)
AVS discharge paperwork reviewed with patient, patient's husband, and 2 children at bedside. I answered all questions patient and family had. Both peripheral Ivs removed.   Last vitals:  Temp 98.7  BP 120/68 (83)  HR 85 RR 17 SpO2 97% RA  All patient belongings returned.  Montez Hageman RN

## 2020-11-26 NOTE — Evaluation (Signed)
Physical Therapy Evaluation Patient Details Name: Erika Mitchell MRN: [id number] DOB: 07-Apr-1955 Today's Date: 11/26/2020  History of Present Illness  65 y/o female presented to [facility] ED on 10/17 with episodic R sided movements and clumsiness with changes in speech. MRI showed R frontal enhancing mass. S/p R craniotomy for tumor resection on 10/19. 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 Patient presents with mild L sided weakness, impaired balance, decreased activity tolerance, L inattention, and impaired cognition. Patient ambulating at min guard level with no AD for safety. Patient scored 17/24 on DGI which is indicative of increased fall risk. Educated patient on fall risk and importance of having supervision for safety, patient and husband verbalized understanding. Patient will benefit from skilled PT services during acute stay to address listed deficits. Recommend neuro OPPT at discharge to address balance and strength deficits.        Recommendations for follow up therapy are one component of a multi-disciplinary discharge planning process, led by the attending physician.  Recommendations may be updated based on patient status, additional functional criteria and insurance authorization.  Follow Up Recommendations Outpatient PT (neuro)    Equipment Recommendations  None recommended by PT    Recommendations for Other Services       Precautions / Restrictions Precautions Precautions: Fall      Mobility  Bed Mobility Overal bed mobility: Needs Assistance Bed Mobility: Supine to Sit     Supine to sit: Supervision          Transfers Overall transfer level: Needs assistance Equipment used: None Transfers: Sit to/from Stand Sit to Stand: Min guard         General transfer comment: min guard for safety  Ambulation/Gait Ambulation/Gait assistance: Min  guard Gait Distance (Feet): 200 Feet Assistive device: None Gait Pattern/deviations: Step-through pattern;Decreased stride length;Drifts right/left Gait velocity: decreased   General Gait Details: Balance deficits noted during head turns with ambulation and obstacle negotiation. Min guard for safety. No overt LOB noted. L inattention and required cues to turn towards L with no awareness that patient was walking towards another patient's room  Stairs Stairs: Yes Stairs assistance: Min guard Stair Management: One rail Right;Alternating pattern;Forwards Number of Stairs: 3 General stair comments: min guard for safety  Wheelchair Mobility    Modified Rankin (Stroke Patients Only)       Balance Overall balance assessment: Mild deficits observed, not formally tested                               Standardized Balance Assessment Standardized Balance Assessment : Dynamic Gait Index   Dynamic Gait Index Level Surface: Normal Change in Gait Speed: Mild Impairment Gait with Horizontal Head Turns: Mild Impairment Gait with Vertical Head Turns: Mild Impairment Gait and Pivot Turn: Mild Impairment Step Over Obstacle: Mild Impairment Step Around Obstacles: Mild Impairment Steps: Mild Impairment Total Score: 17       Pertinent Vitals/Pain Pain Assessment: Faces Faces Pain Scale: Hurts a little bit Pain Location: head Pain Descriptors / Indicators: Discomfort;Sore Pain Intervention(s): Limited activity within patient's tolerance;Monitored during session    [place] expects to be discharged to:: Private residence Living Arrangements: Spouse/significant other Available Help at Discharge: Family;Available 24 hours/day Type of Home: House Home Access: Stairs to enter Entrance Stairs-Rails: Right;Left Entrance Stairs-Number of Steps: 5 (13 at the back) Home Layout: Able  to live on main level with bedroom/bathroom;Multi-level;Two level Home Equipment:  Tyler Robidoux - 2 wheels;Bedside commode;Shower seat (can borrow the above equipment)      Prior Function Level of Independence: Independent         Comments: drives; works at [employer] ([employer])     Hand Dominance   Dominant Hand: Left    Extremity/Trunk Assessment   Upper Extremity Assessment Upper Extremity Assessment: Defer to OT evaluation LUE Deficits / Details: mild weakness LUE Coordination: decreased fine motor    Lower Extremity Assessment Lower Extremity Assessment: LLE deficits/detail LLE Deficits / Details: mild weakness compared to R LE LLE Coordination: decreased fine motor    Cervical / Trunk Assessment Cervical / Trunk Assessment: Normal  Communication   Communication: Expressive difficulties (word finding deficits at times)  Cognition Arousal/Alertness: Awake/alert Behavior During Therapy: Flat affect Overall Cognitive Status: Impaired/Different from baseline Area of Impairment: Attention;Memory;Following commands;Safety/judgement;Awareness;Problem solving                   Current Attention Level: Selective   Following Commands: Follows one step commands consistently;Follows multi-step commands inconsistently Safety/Judgement: Decreased awareness of safety;Decreased awareness of deficits Awareness: Emergent Problem Solving: Slow processing;Difficulty sequencing General Comments: L inattention noted during functional tasks      General Comments      Exercises     Assessment/Plan    PT Assessment Patient needs continued PT services  PT Problem List Decreased strength;Decreased activity tolerance;Decreased balance;Decreased mobility;Decreased coordination;Decreased cognition       PT Treatment Interventions DME instruction;Gait training;Stair training;Functional mobility training;Therapeutic activities;Therapeutic exercise;Neuromuscular re-education;Balance training;Patient/family education    PT Goals (Current goals  can be found in the Care Plan section)  Acute Rehab PT Goals Patient Stated Goal: to go home PT Goal Formulation: With patient Time For Goal Achievement: 12/10/20 Potential to Achieve Goals: Good    Frequency Min 4X/week   Barriers to discharge        Co-evaluation               AM-PAC PT "6 Clicks" Mobility  Outcome Measure Help needed turning from your back to your side while in a flat bed without using bedrails?: A Little Help needed moving from lying on your back to sitting on the side of a flat bed without using bedrails?: A Little Help needed moving to and from a bed to a chair (including a wheelchair)?: A Little Help needed standing up from a chair using your arms (e.g., wheelchair or bedside chair)?: A Little Help needed to walk in hospital room?: A Little Help needed climbing 3-5 steps with a railing? : A Little 6 Click Score: 18    End of Session Equipment Utilized During Treatment: Gait belt Activity Tolerance: Patient tolerated treatment well Patient left: in chair;with call bell/phone within reach;with chair alarm set;with family/visitor present Nurse Communication: Mobility status PT Visit Diagnosis: Unsteadiness on feet (R26.81);Muscle weakness (generalized) (M62.81);Other symptoms and signs involving the nervous system (R29.898)    Time: 6945-0388 PT Time Calculation (min) (ACUTE ONLY): 37 min   Charges:   PT Evaluation $PT Eval Moderate Complexity: 1 Mod PT Treatments $Gait Training: 8-22 mins        Laurier Jasperson A. Gilford Rile PT, DPT Acute Rehabilitation Services Pager [phone] Office [phone]   Linna Hoff 11/26/2020, 1:29 PM

## 2020-11-26 NOTE — Discharge Summary (Signed)
Discharge Summary  Date of Admission: 11/23/2020  Date of Discharge: 11/26/20  Attending Physician: Emelda Brothers, MD  Hospital Course: Patient presented to the ED with paroxysmal episodes of speech difficulty and left sided clumsiness concerning for simple partial seizures. An MRI brain showed a right frontal enhancing mass. She was taken to the OR on 27/25/36 for an uncomplicated craniotomy and resection. Post-op MRI showed gross total resection of the lesion without concerning findings. She did well post-op, did not have any further partial seizures or neurologic abnormalities after starking keppra, her hospital course was uncomplicated and the patient was discharged home on 11/26/20. She will follow up in clinic with me in 2 weeks. Final pathology was pending at the time of discharge.   Neurologic exam at discharge:  AOx3, PERRL, EOMI, FS, TM Strength 5/5 x4, SILTx4, no drift  Discharge diagnosis: Brain tumor  Judith Part, MD 11/26/20 1:46 PM

## 2020-11-27 ENCOUNTER — Other Ambulatory Visit: Payer: Self-pay

## 2020-12-04 ENCOUNTER — Emergency Department (HOSPITAL_COMMUNITY): Payer: BC Managed Care – PPO

## 2020-12-04 ENCOUNTER — Inpatient Hospital Stay (HOSPITAL_COMMUNITY)
Admission: EM | Admit: 2020-12-04 | Discharge: 2020-12-09 | DRG: 081 | Disposition: A | Discharge status: Home / Self Care | Payer: BC Managed Care – PPO | Attending: Internal Medicine | Admitting: Internal Medicine

## 2020-12-04 ENCOUNTER — Encounter (HOSPITAL_COMMUNITY): Payer: Self-pay | Admitting: Emergency Medicine

## 2020-12-04 DIAGNOSIS — Z85828 Personal history of other malignant neoplasm of skin: Secondary | ICD-10-CM | POA: Diagnosis not present

## 2020-12-04 DIAGNOSIS — G8194 Hemiplegia, unspecified affecting left nondominant side: Secondary | ICD-10-CM | POA: Diagnosis not present

## 2020-12-04 DIAGNOSIS — R531 Weakness: Secondary | ICD-10-CM

## 2020-12-04 DIAGNOSIS — G9782 Other postprocedural complications and disorders of nervous system: Secondary | ICD-10-CM | POA: Diagnosis present

## 2020-12-04 DIAGNOSIS — R4701 Aphasia: Secondary | ICD-10-CM | POA: Diagnosis present

## 2020-12-04 DIAGNOSIS — Z87891 Personal history of nicotine dependence: Secondary | ICD-10-CM | POA: Diagnosis not present

## 2020-12-04 DIAGNOSIS — R29810 Facial weakness: Secondary | ICD-10-CM | POA: Diagnosis present

## 2020-12-04 DIAGNOSIS — R000 Tachycardia, unspecified: Secondary | ICD-10-CM | POA: Diagnosis not present

## 2020-12-04 DIAGNOSIS — Z20822 Contact with and (suspected) exposure to covid-19: Secondary | ICD-10-CM | POA: Diagnosis not present

## 2020-12-04 DIAGNOSIS — H8122 Vestibular neuronitis, left ear: Secondary | ICD-10-CM

## 2020-12-04 DIAGNOSIS — Z9889 Other specified postprocedural states: Secondary | ICD-10-CM

## 2020-12-04 DIAGNOSIS — G936 Cerebral edema: Secondary | ICD-10-CM | POA: Diagnosis not present

## 2020-12-04 DIAGNOSIS — E871 Hypo-osmolality and hyponatremia: Secondary | ICD-10-CM | POA: Diagnosis not present

## 2020-12-04 DIAGNOSIS — D496 Neoplasm of unspecified behavior of brain: Secondary | ICD-10-CM | POA: Diagnosis not present

## 2020-12-04 DIAGNOSIS — C719 Malignant neoplasm of brain, unspecified: Secondary | ICD-10-CM | POA: Diagnosis not present

## 2020-12-04 DIAGNOSIS — R197 Diarrhea, unspecified: Secondary | ICD-10-CM | POA: Diagnosis not present

## 2020-12-04 DIAGNOSIS — Z801 Family history of malignant neoplasm of trachea, bronchus and lung: Secondary | ICD-10-CM | POA: Diagnosis not present

## 2020-12-04 DIAGNOSIS — Z79899 Other long term (current) drug therapy: Secondary | ICD-10-CM

## 2020-12-04 DIAGNOSIS — Z8249 Family history of ischemic heart disease and other diseases of the circulatory system: Secondary | ICD-10-CM | POA: Diagnosis not present

## 2020-12-04 DIAGNOSIS — R569 Unspecified convulsions: Secondary | ICD-10-CM

## 2020-12-04 DIAGNOSIS — I10 Essential (primary) hypertension: Secondary | ICD-10-CM | POA: Diagnosis not present

## 2020-12-04 DIAGNOSIS — R29898 Other symptoms and signs involving the musculoskeletal system: Secondary | ICD-10-CM | POA: Diagnosis not present

## 2020-12-04 LAB — COMPREHENSIVE METABOLIC PANEL
ALT: 21 U/L (ref 0–44)
AST: 16 U/L (ref 15–41)
Albumin: 3.8 g/dL (ref 3.5–5.0)
Alkaline Phosphatase: 55 U/L (ref 38–126)
Anion gap: 8 (ref 5–15)
BUN: 8 mg/dL (ref 8–23)
CO2: 25 mmol/L (ref 22–32)
Calcium: 9.1 mg/dL (ref 8.9–10.3)
Chloride: 101 mmol/L (ref 98–111)
Creatinine, Ser: 0.75 mg/dL (ref 0.44–1.00)
GFR, Estimated: >60 mL/min (ref >60)
Glucose, Bld: 97 mg/dL (ref 70–99)
Potassium: 3.5 mmol/L (ref 3.5–5.1)
Sodium: 134 mmol/L — ABNORMAL LOW (ref 135–145)
Total Bilirubin: 0.6 mg/dL (ref 0.3–1.2)
Total Protein: 6.4 g/dL — ABNORMAL LOW (ref 6.5–8.1)

## 2020-12-04 LAB — CBC WITH DIFFERENTIAL/PLATELET
Abs Immature Granulocytes: 0.01 10*3/uL (ref 0.00–0.07)
Basophils Absolute: 0 10*3/uL (ref 0.0–0.1)
Basophils Relative: 1 %
Eosinophils Absolute: 0 10*3/uL (ref 0.0–0.5)
Eosinophils Relative: 1 %
HCT: 41.7 % (ref 36.0–46.0)
Hemoglobin: 13.8 g/dL (ref 12.0–15.0)
Immature Granulocytes: 0 %
Lymphocytes Relative: 35 %
Lymphs Abs: 1.9 10*3/uL (ref 0.7–4.0)
MCH: 31 pg (ref 26.0–34.0)
MCHC: 33.1 g/dL (ref 30.0–36.0)
MCV: 93.7 fL (ref 80.0–100.0)
Monocytes Absolute: 0.4 10*3/uL (ref 0.1–1.0)
Monocytes Relative: 7 %
Neutro Abs: 3.2 10*3/uL (ref 1.7–7.7)
Neutrophils Relative %: 56 %
Platelets: 267 10*3/uL (ref 150–400)
RBC: 4.45 MIL/uL (ref 3.87–5.11)
RDW: 12.1 % (ref 11.5–15.5)
WBC: 5.6 10*3/uL (ref 4.0–10.5)
nRBC: 0 % (ref 0.0–0.2)

## 2020-12-04 LAB — PROTIME-INR
INR: 1 (ref 0.8–1.2)
Prothrombin Time: 12.7 seconds (ref 11.4–15.2)

## 2020-12-04 IMAGING — MR MR HEAD WO/W CM
9 of 13 series · 35 of 48 positions shown · IV contrast (gadavist)
Comparison: CT head without contrast [DATE]. MR head without
and with contrast [DATE] and [DATE].

CLINICAL DATA: Progressive left upper and lower extremity weakness.
Status post resection of brain tumor.

EXAM:
MRI HEAD WITHOUT AND WITH CONTRAST
TECHNIQUE: Multiplanar, multiecho pulse sequences of the brain and surrounding
structures were obtained without and with intravenous contrast.
CONTRAST:  7.9mL GADAVIST GADOBUTROL 1 MMOL/ML IV SOLN

[Series 3: DWI · axial · 3.0mm · 1.09mm/px · z∈[-67,+92]mm · 8 of 108 slices shown (1 of 4)]
[im 1/108]
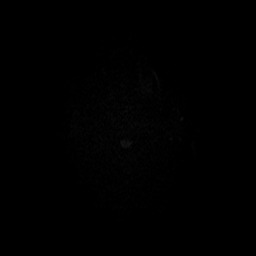
[im 12/108]
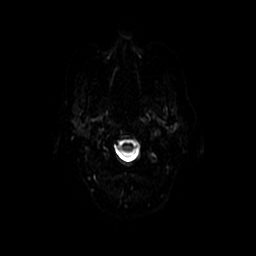
[im 36/108]
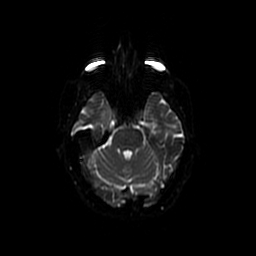
[im 48/108]
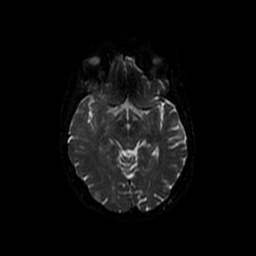
[im 60/108]
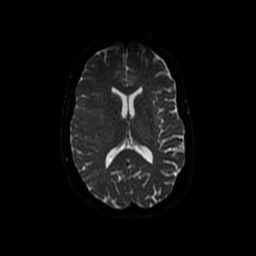
[im 72/108]
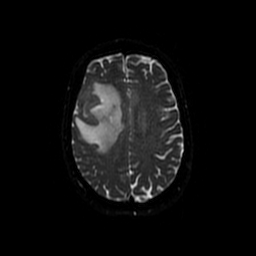
[im 96/108]
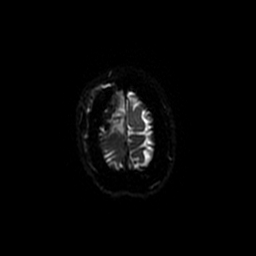
[im 108/108]
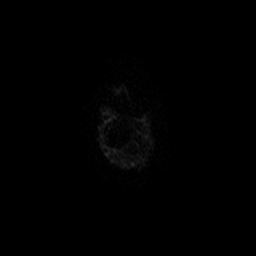

[Series 4: DWI · coronal · 5.0mm · 1.09mm/px · 7 of 76 slices shown (2 of 4)]
[im 1/76]
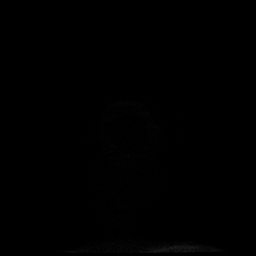
[im 13/76]
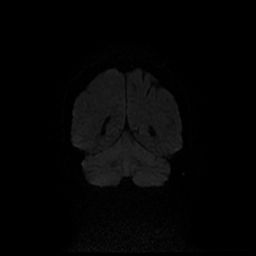
[im 26/76]
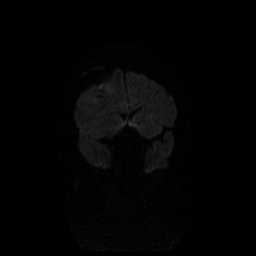
[im 38/76]
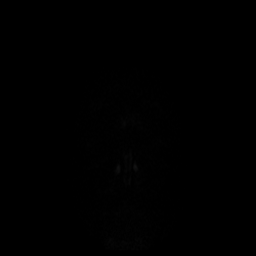
[im 51/76]
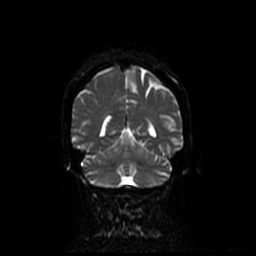
[im 63/76]
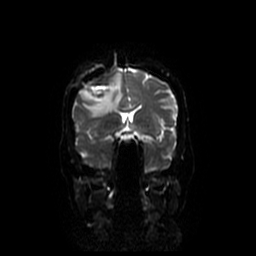
[im 76/76]
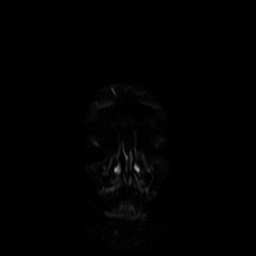

[Series 6: T2 · axial · 5.0mm · 0.43mm/px · 1 of 27 slices shown]
[im 1/27]
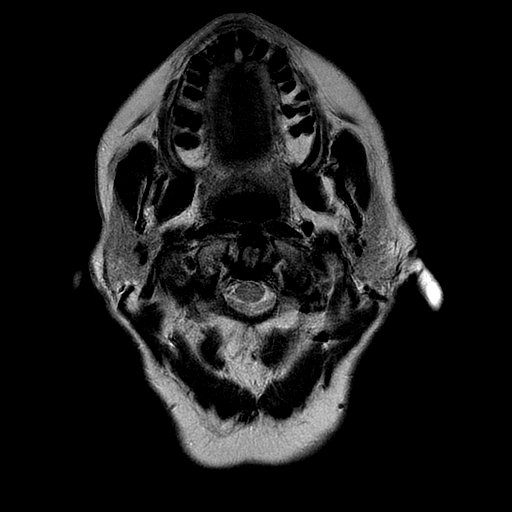

[Series 7: FLAIR · axial · 5.0mm · 0.43mm/px · z∈[-65,+91]mm · 3 of 27 slices shown]
[im 1/27]
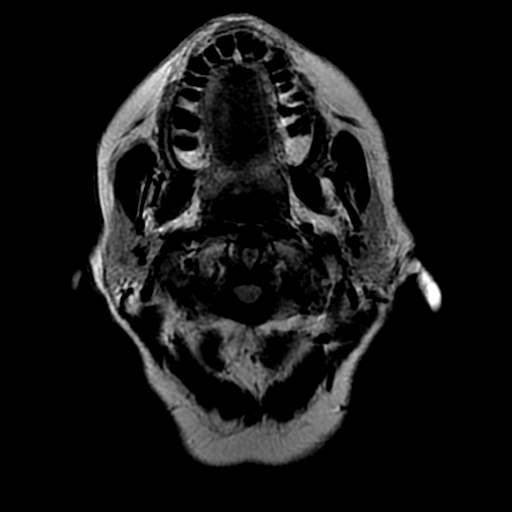
[im 14/27]
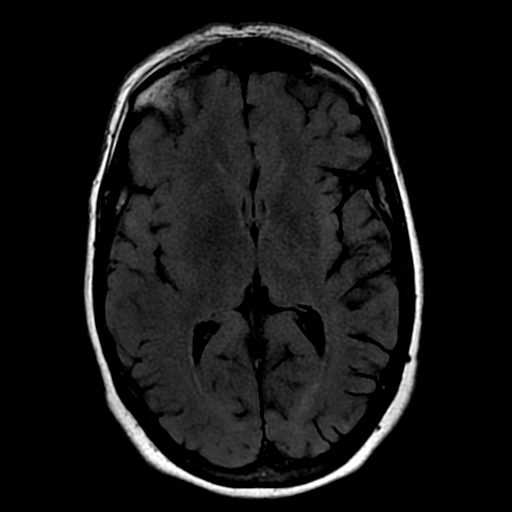
[im 27/27]
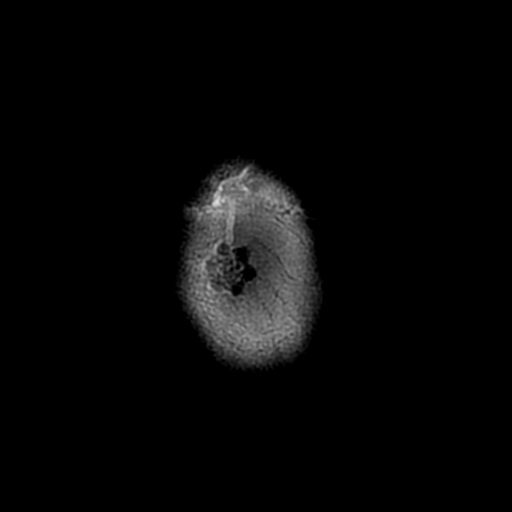

[Series 11: T1 post-contrast · axial · 5.0mm · 0.47mm/px · z∈[-64,+90]mm · 2 of 23 slices shown (1 of 3)]
[im 1/23]
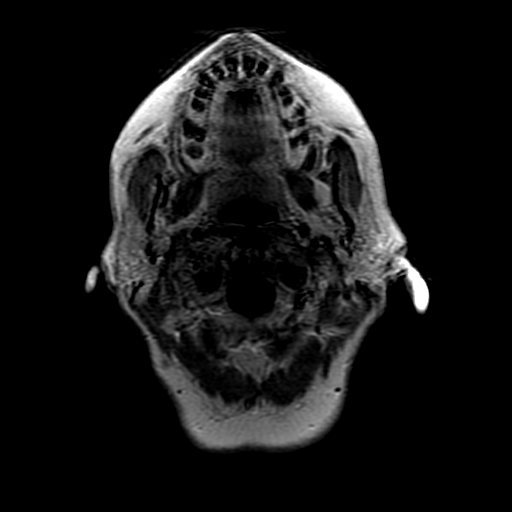
[im 23/23]
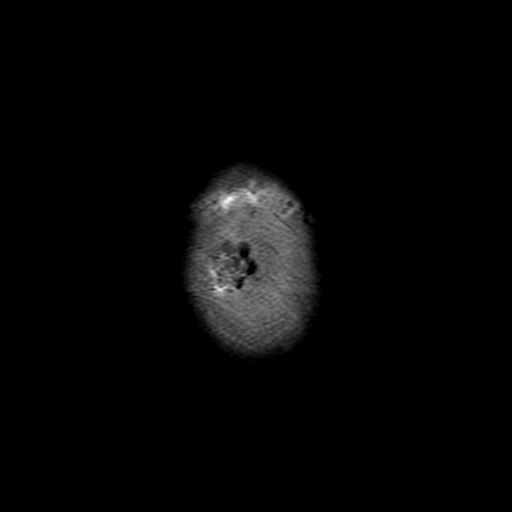

[Series 12: T1 post-contrast · coronal · 5.0mm · 0.39mm/px · 3 of 29 slices shown (2 of 3)]
[im 1/29]
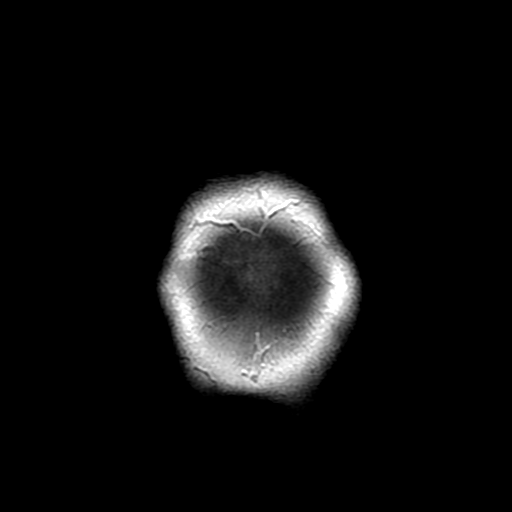
[im 15/29]
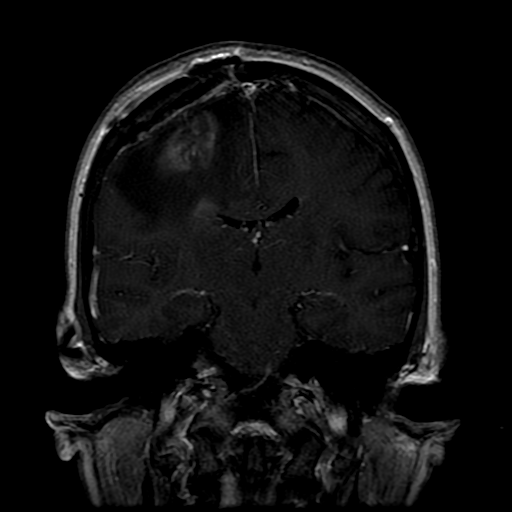
[im 29/29]
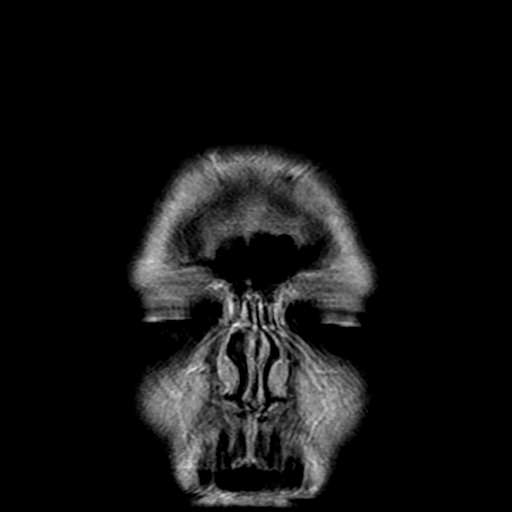

[Series 13: T1 post-contrast · sagittal · 5.0mm · 0.47mm/px · 2 of 23 slices shown (3 of 3)]
[im 1/23]
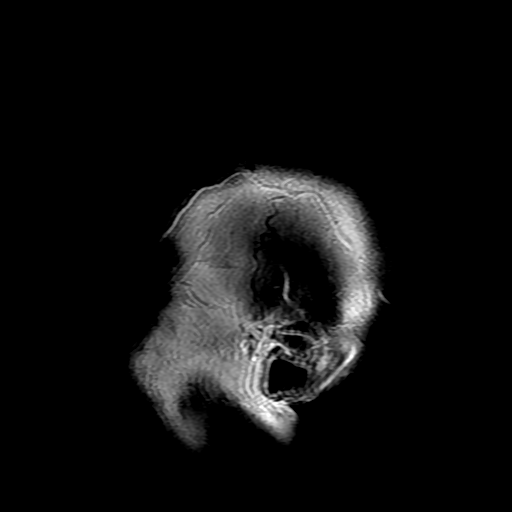
[im 23/23]
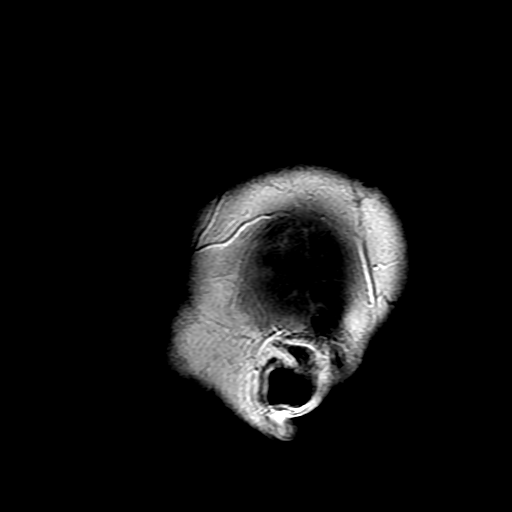

[Series 300: DWI · axial · 3.0mm · 1.09mm/px · z∈[-67,+92]mm · 5 of 54 slices shown (3 of 4)]
[im 1/54]
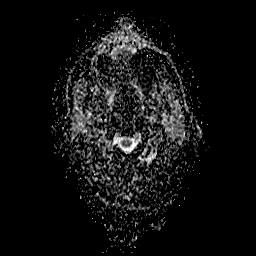
[im 14/54]
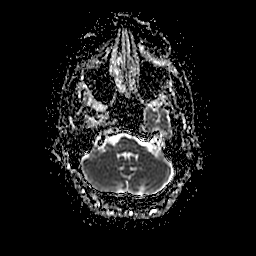
[im 27/54]
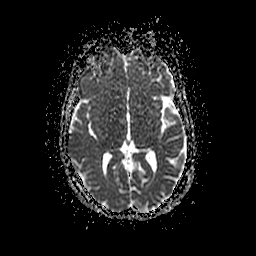
[im 40/54]
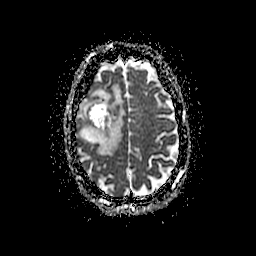
[im 54/54]
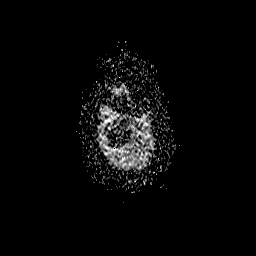

[Series 400: DWI · coronal · 5.0mm · 1.09mm/px · 4 of 38 slices shown (4 of 4)]
[im 1/38]
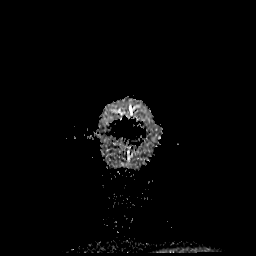
[im 13/38]
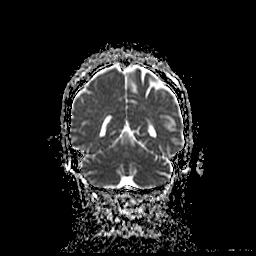
[im 25/38]
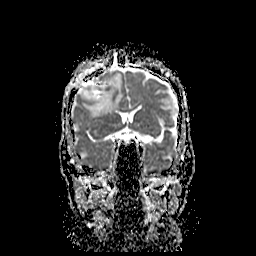
[im 38/38]
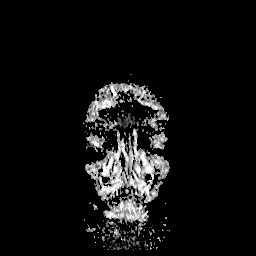

[35 of 48 positions shown; findings below may reference images not displayed]

FINDINGS: Brain: The surgical cavity has expanded. Peripheral enhancement is
noted. The cavity measures 3.0 x 3.5 x 3.6 cm. Marked surrounding
edema has progressed, as seen on the previous CT scan. This results
in effacement of the sulci and partial effacement the right lateral
ventricle. Midline shift is 3 mm right to left. Progressive dural
enhancement is reactive over the right hemisphere.

No distal enhancement is present.

Cortical and subcortical edema is present along the medial left
frontal lobe.

Remote lacunar infarcts are again noted in the inferior right
cerebellum. The brainstem and cerebellum are otherwise unremarkable.
The internal auditory canals are within normal limits.

Vascular: Flow is present in the major intracranial arteries.

Skull and upper cervical spine: The craniocervical junction is
normal. Upper cervical spine is within normal limits. Marrow signal
is unremarkable.

Sinuses/Orbits: The paranasal sinuses and mastoid air cells are
clear. The globes and orbits are within normal limits.
IMPRESSION: 1. Progressive enlargement of a surgical cavity with peripheral
enhancement. Given the rapid change in thick peripheral enhancement,
this is most concerning for infection.
2. Stable blood products.
3. No evidence for significant tumor.
4. Progressive surrounding edema and mass effect with 3 mm right to
left midline shift.
5. Stable cortical and subcortical edema in the medial left frontal
lobe.
6. Remote lacunar infarcts of the inferior right cerebellum.

These results were called by telephone at the time of interpretation
on [DATE] at [DATE] to provider SANGANI, who verbally
acknowledged these results.

## 2020-12-04 IMAGING — CT CT HEAD W/O CM
4 series · 16 of 47 positions shown, 18 images · non-contrast
Comparison: CT head without contrast [DATE]. MR head without
and with contrast [DATE].

CLINICAL DATA: Neuro deficit, acute, stroke suspected. Status post
tumor removal. Left-sided weakness and facial droop.

EXAM:
CT HEAD WITHOUT CONTRAST
TECHNIQUE: Contiguous axial images were obtained from the base of the skull
through the vertex without intravenous contrast.

[Series 3: head without · axial · non-contrast · 0.44mm/px · z∈[-86,+39]mm · 7 of 35 slices shown, 9 images]
[im 5/35  brain]
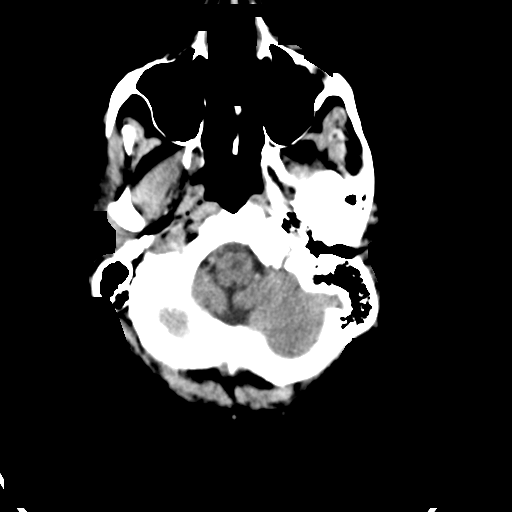
[im 5/35  bone]
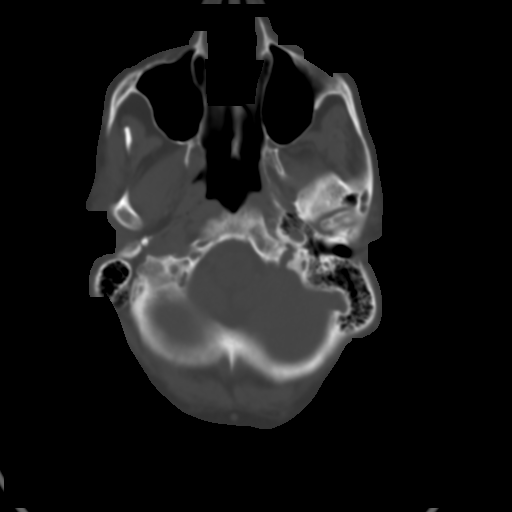
[im 9/35  brain]
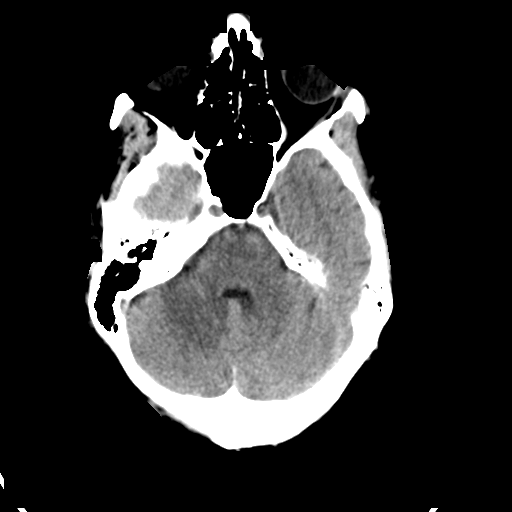
[im 13/35  brain]
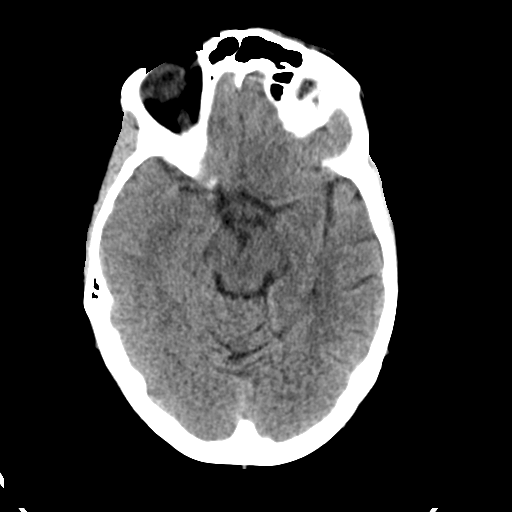
[im 18/35  brain]
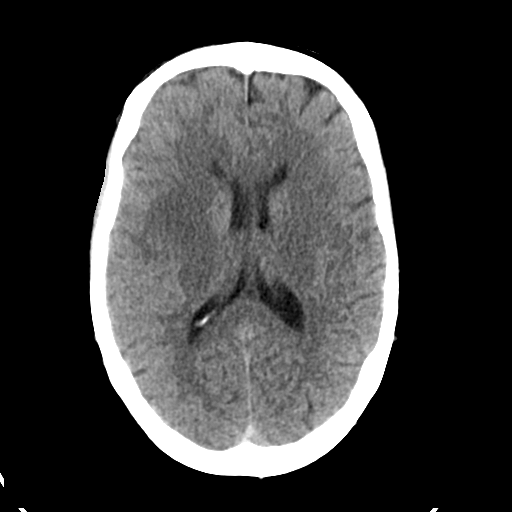
[im 22/35  brain]
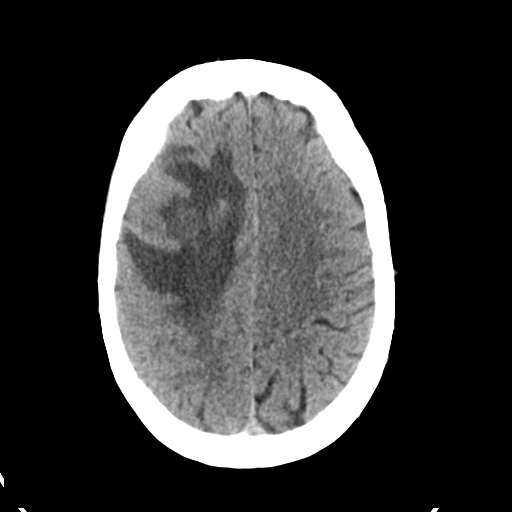
[im 22/35  bone]
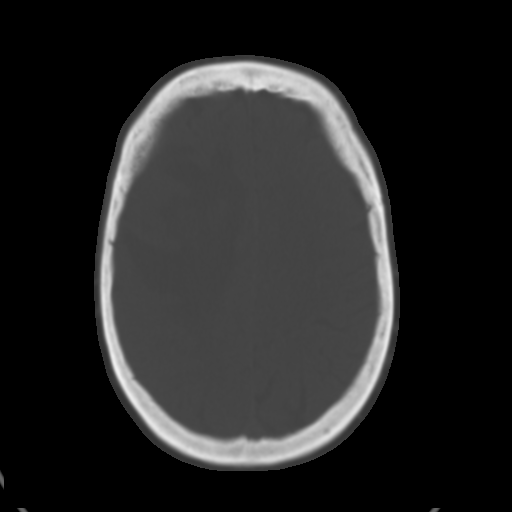
[im 26/35  brain]
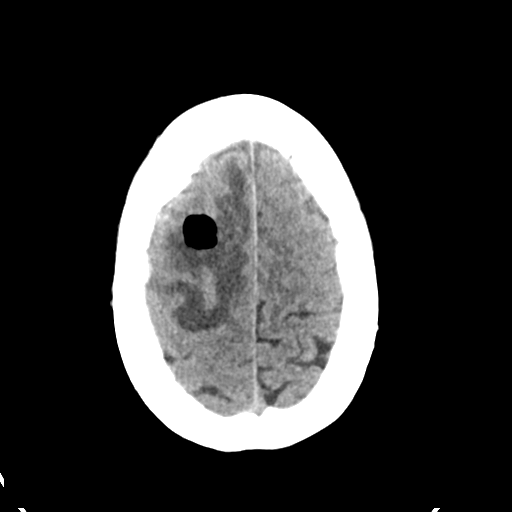
[im 30/35  brain]
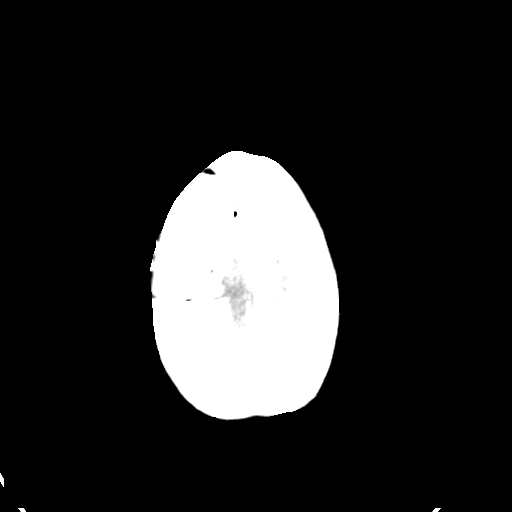

[Series 4: head bone · axial · 0.44mm/px · z∈[-90,-56]mm · 3 of 87 slices shown]
[im 9/87  bone]
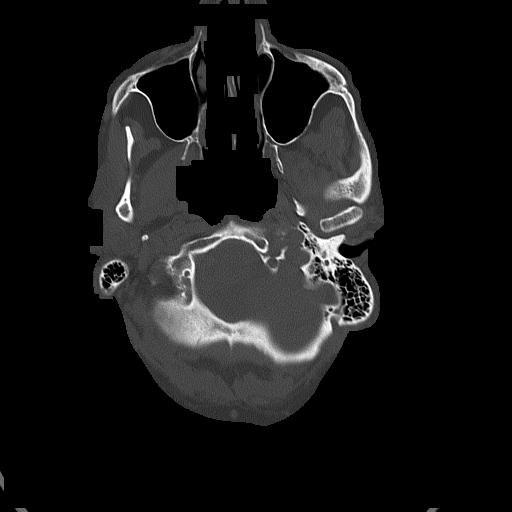
[im 18/87  bone]
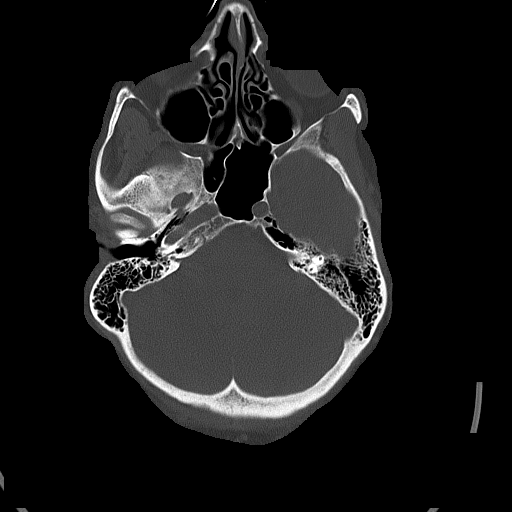
[im 26/87  bone]
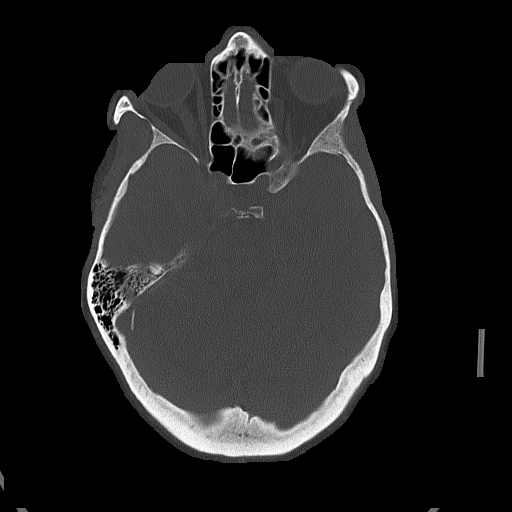

[Series 5: head without cor · coronal · non-contrast · 0.30mm/px · 3 of 73 slices shown]
[im 25/73  brain]
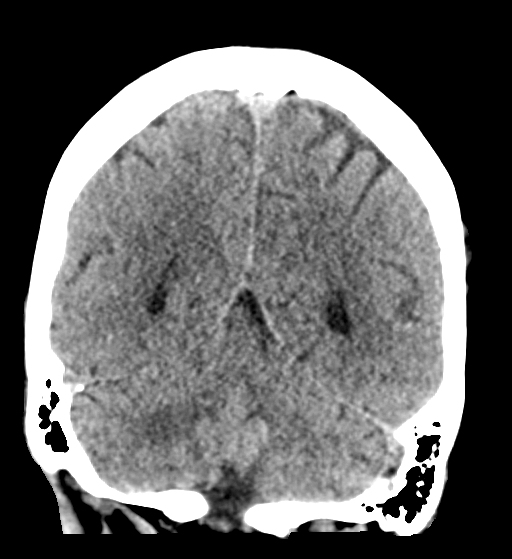
[im 33/73  brain]
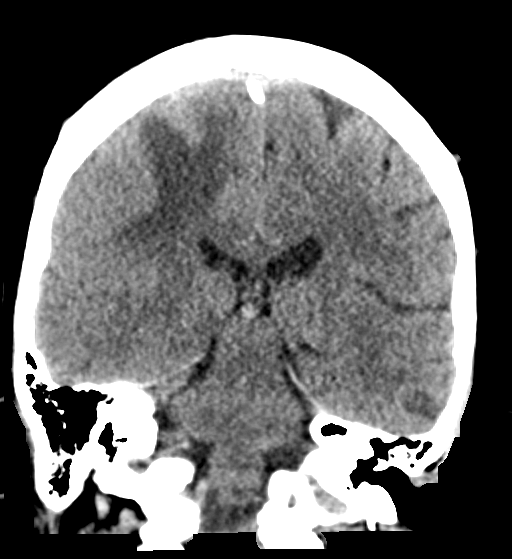
[im 41/73  brain]
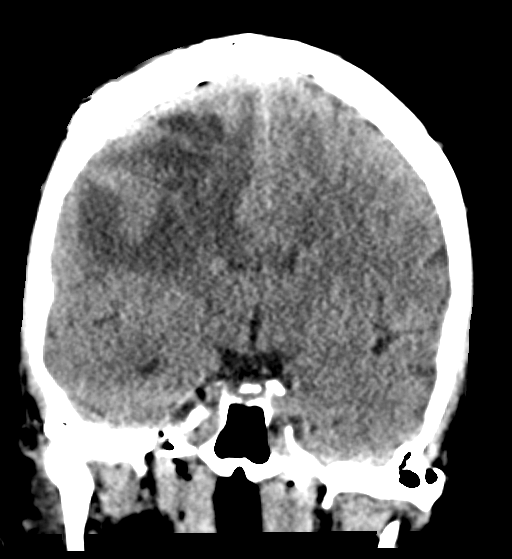

[Series 6: head without sag · sagittal · non-contrast · 0.32mm/px · 3 of 54 slices shown]
[im 18/54  brain]
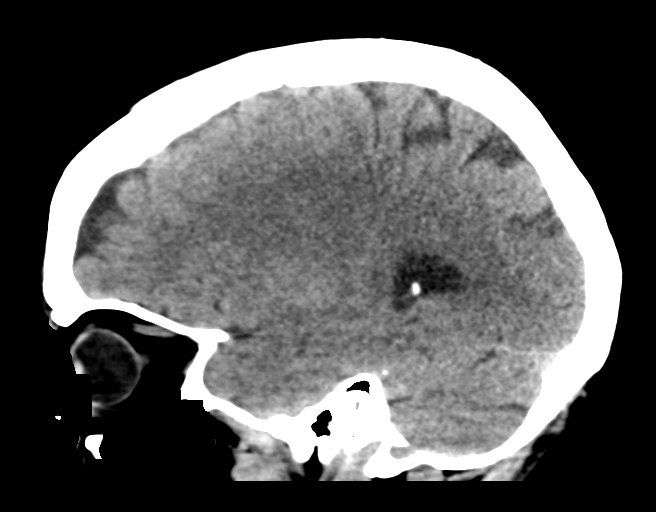
[im 27/54  brain]
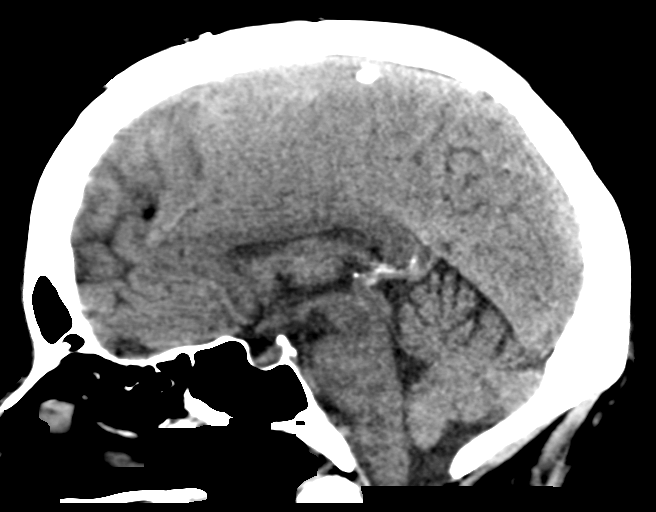
[im 36/54  brain]
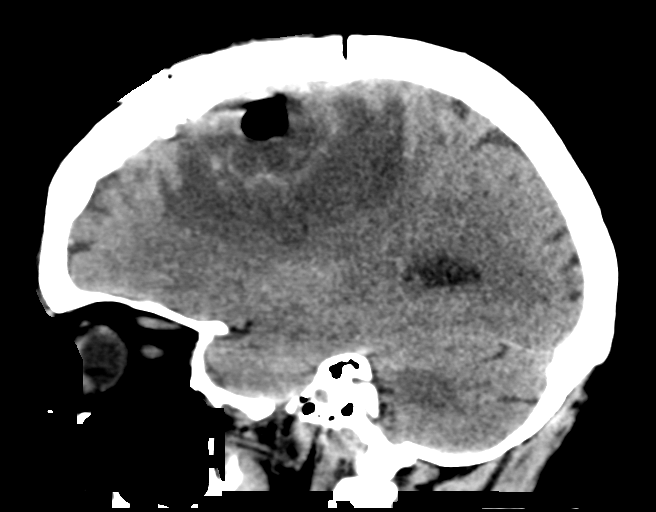

[16 of 47 positions shown; findings below may reference images not displayed]

FINDINGS: Brain: Patient is status post right frontal craniotomy. Gas is
present surgical cavity. Marked edema surrounds the operative site.
The extent of surrounding vasogenic edema has increased. There is
now mass effect with effacement of the sulci. No significant midline
shift is present.

Minimal blood products are again noted within the operative cavity.
No significant new hemorrhage is present.

The ventricles are of normal size. No significant extra-axial fluid
collection is present. The brainstem and cerebellum are within
normal limits.

Vascular: No hyperdense vessel or unexpected calcification.

Skull: Right frontal craniotomy.  Calvarium is otherwise intact.

Sinuses/Orbits: The paranasal sinuses and mastoid air cells are
clear. The globes and orbits are within normal limits.
IMPRESSION: 1. Status post right frontal craniotomy for tumor resection.
2. Marked edema surrounding the operative site has increased.
3. There is now mass effect with effacement of the sulci.
4. No significant midline shift.
5. Minimal blood products are again noted within the operative
cavity.
6. No significant new hemorrhage.

These results were called by telephone at the time of interpretation
on [DATE] at [DATE] to provider BYANTORO , who
verbally acknowledged these results.

## 2020-12-04 MED ORDER — GADOBUTROL 1 MMOL/ML IV SOLN
7.9000 mL | Freq: Once | INTRAVENOUS | Status: AC | PRN
  Administered 2020-12-04: 7.9 mL via INTRAVENOUS

## 2020-12-04 MED ORDER — LORAZEPAM 1 MG PO TABS
1.0000 mg | ORAL_TABLET | Freq: Once | ORAL | Status: AC
  Administered 2020-12-04: 1 mg via ORAL
  Filled 2020-12-04: qty 1

## 2020-12-04 MED ORDER — DEXAMETHASONE SODIUM PHOSPHATE 10 MG/ML IJ SOLN
10.0000 mg | Freq: Four times a day (QID) | INTRAMUSCULAR | Status: DC
  Administered 2020-12-05 (×2): 10 mg via INTRAVENOUS
  Filled 2020-12-04 (×2): qty 1

## 2020-12-04 NOTE — H&P (Signed)
History and Physical  Erika Mitchell PXT:[id number] DOB: Dec 12, 1955 DOA: 12/04/2020  Referring physician: Cherly Hensen, DO  PCP: Ginger Organ., MD  Patient coming from: Home  Chief Complaint: Left-sided weakness  HPI: Erika Mitchell is a 65 y.o. 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) who presents to the emergency department due to 5-6-day onset of progressive weakness of left upper and lower extremities.  Patient was admitted from 10/17-10/20 paroxysmal episodes of speech difficulty and left-sided clumsiness which was thought to be due to simple partial seizures, MRI of brain done showed right frontal enhancing mass and patient was taken to the OR on 10/19 for craniotomy and resection.  She was discharged home with Keppra. Patient complained of increased difficulty to ambulate due to left leg weakness.  Patient also complained of inability to lift left upper extremity.  Son spoke with Dr. Venetia Constable and and he referred her to the ED.  She denies headaches, slurred speech, facial droop, dizziness, nausea, vomiting  ED Course:  In the emergency department, she was hemodynamically stable.  Work-up in the ED showed normal CBC and BMP except for mild hyponatremia.  Influenza A, B, SARS coronavirus 2 was negative. CT of head without contrast showed s/p right frontal craniotomy for tumor resection.  Marked edema surrounding the operative site as increased.  There is now mass-effect with effacement of the sulci.  No significant midline shift.  Minimal blood products are again noted within the operative cavity.  No significant new hemorrhage. MRI head without and with contrast shows progressive enlargement of a surgical cavity with peripheral enhancement.  Given the rapid change intake peripheral enhancement, this is most concerning for infection.  Stable blood products.  No evidence for significant  trauma.  Progressive surrounding edema and mass-effect with 3 mm right to left midline shift. Neurosurgery was consulted and recommended Decadron.  IV lorazepam 1 mg x 1 was given.  Hospitalist was asked to admit.  For further evaluation and management.   Review of Systems: Constitutional: Negative for chills and fever.  HENT: Positive for facial swelling.  Negative for ear pain and sore throat.   Eyes: Negative for pain and visual disturbance.  Respiratory: Negative for cough, chest tightness and shortness of breath.   Cardiovascular: Negative for chest pain and palpitations.  Gastrointestinal: Negative for abdominal pain and vomiting.  Endocrine: Negative for polyphagia and polyuria.  Genitourinary: Negative for decreased urine volume, dysuria, enuresis Musculoskeletal: Negative for arthralgias and back pain.  Skin: Negative for color change and rash.  Allergic/Immunologic: Negative for immunocompromised state.  Neurological: Positive for left-sided weakness.  Negative for tremors, syncope, light-headedness and headaches.  Hematological: Does not bruise/bleed easily.  All other systems reviewed and are negative   Past Medical History:  Diagnosis Date   Cancer ([place])    basal cell carcinoma on left arm left eye brow   Hypertension    Vertigo    Vestibular neuritis    right ear   Past Surgical History:  Procedure Laterality Date   APPLICATION OF CRANIAL NAVIGATION Right 11/25/2020   Procedure: APPLICATION OF CRANIAL NAVIGATION;  Surgeon: Judith Part, MD;  Location: [facility];  Service: Neurosurgery;  Laterality: Right;   CHOLECYSTECTOMY  2017   CRANIOTOMY Right 11/25/2020   Procedure: CRANIOTOMY FOR  TUMOR RESECTION WITH Lucky Rathke;  Surgeon: Judith Part, MD;  Location: [facility];  Service: Neurosurgery;  Laterality: Right;   laproscopy  surgery for endometriosisi   OOPHORECTOMY  1974&1990   rt then left ovary    Social History:  reports that she quit smoking about  24 years ago. Her smoking use included cigarettes. She has never used smokeless tobacco. She reports current alcohol use of about 2.0 standard drinks per week. She reports that she does not use drugs.   Allergies  Allergen Reactions   Epinephrine Other (See Comments)    "dizziness, rapid heartbeat"   Azithromycin     dizziness   Motrin [Ibuprofen]     dizziness    Family History  Problem Relation Age of Onset   Lung cancer Mother    Aneurysm Father    Heart disease Sister    Atrial fibrillation Brother    Colon cancer Neg Hx      Prior to Admission medications   Medication Sig Start Date End Date Taking? Authorizing Provider  acetaminophen (TYLENOL) 500 MG tablet Take 1,000 mg by mouth every 6 (six) hours as needed for moderate pain.   Yes [provider]  ALPRAZolam (XANAX) 0.25 MG tablet Take 4.5 tablet daily for vestibular neuritis Patient taking differently: Take 1.125 mg by mouth daily as needed (Vestibular neuritis). Patient states she takes 4 of the 0.25 mg tablets and then splits one 0.25 mg tablet to make total of 1.125 mg daily for vestibular neuritis 06/26/19  Yes Lomax, Amy, NP  HYDROcodone-acetaminophen (NORCO/VICODIN) 5-325 MG tablet Take 1-2 tablets by mouth every 4 (four) hours as needed for moderate pain. 11/26/20  Yes Judith Part, MD  levETIRAcetam (KEPPRA) 500 MG tablet Take 1 tablet (500 mg total) by mouth 2 (two) times daily. 11/26/20  Yes Judith Part, MD  telmisartan (MICARDIS) 40 MG tablet Take 40 mg by mouth daily.   Yes [provider]  telmisartan (MICARDIS) 80 MG tablet Take 80 mg by mouth daily. 12/02/20   [provider]    Physical Exam: BP 120/64   Pulse 64   Temp 99.1 F (37.3 C) (Oral)   Resp 19   SpO2 95%   General: 65 y.o. year-old female well developed well nourished in no acute distress.  Alert and oriented x3. HEENT: NCAT, EOMI Neck: Supple, trachea medial Cardiovascular: Regular rate and  rhythm with no rubs or gallops.  No thyromegaly or JVD noted.  No lower extremity edema. 2/4 pulses in all 4 extremities. Respiratory: Clear to auscultation with no wheezes or rales. Good inspiratory effort. Abdomen: Soft, nontender nondistended with normal bowel sounds x4 quadrants. Muskuloskeletal: No cyanosis, clubbing or edema noted bilaterally Neuro: LUE 0/5, LLE 3/5. CN II-XII intact, strength 5/5 x 4, sensation, reflexes intact Skin: No ulcerative lesions noted or rashes Psychiatry: Judgement and insight appear normal. Mood is appropriate for condition and setting          Labs on Admission:  Basic Metabolic Panel: Recent Labs  Lab 12/04/20 1532  NA 134*  K 3.5  CL 101  CO2 25  GLUCOSE 97  BUN 8  CREATININE 0.75  CALCIUM 9.1   Liver Function Tests: Recent Labs  Lab 12/04/20 1532  AST 16  ALT 21  ALKPHOS 55  BILITOT 0.6  PROT 6.4*  ALBUMIN 3.8   No results for input(s): LIPASE, AMYLASE in the last 168 hours. No results for input(s): AMMONIA in the last 168 hours. CBC: Recent Labs  Lab 12/04/20 1532  WBC 5.6  NEUTROABS 3.2  HGB 13.8  HCT 41.7  MCV 93.7  PLT 267  Cardiac Enzymes: No results for input(s): CKTOTAL, CKMB, CKMBINDEX, TROPONINI in the last 168 hours.  BNP (last 3 results) No results for input(s): BNP in the last 8760 hours.  ProBNP (last 3 results) No results for input(s): PROBNP in the last 8760 hours.  CBG: No results for input(s): GLUCAP in the last 168 hours.  Radiological Exams on Admission: CT HEAD WO CONTRAST (5MM)  Result Date: 12/04/2020 CLINICAL DATA:  Neuro deficit, acute, stroke suspected. Status post tumor removal. Left-sided weakness and facial droop. EXAM: CT HEAD WITHOUT CONTRAST TECHNIQUE: Contiguous axial images were obtained from the base of the skull through the vertex without intravenous contrast. COMPARISON:  CT head without contrast 11/23/2020. MR head without and with contrast 11/26/2020. FINDINGS: Brain: Patient  is status post right frontal craniotomy. Gas is present surgical cavity. Marked edema surrounds the operative site. The extent of surrounding vasogenic edema has increased. There is now mass effect with effacement of the sulci. No significant midline shift is present. Minimal blood products are again noted within the operative cavity. No significant new hemorrhage is present. The ventricles are of normal size. No significant extra-axial fluid collection is present. The brainstem and cerebellum are within normal limits. Vascular: No hyperdense vessel or unexpected calcification. Skull: Right frontal craniotomy.  Calvarium is otherwise intact. Sinuses/Orbits: The paranasal sinuses and mastoid air cells are clear. The globes and orbits are within normal limits. IMPRESSION: 1. Status post right frontal craniotomy for tumor resection. 2. Marked edema surrounding the operative site has increased. 3. There is now mass effect with effacement of the sulci. 4. No significant midline shift. 5. Minimal blood products are again noted within the operative cavity. 6. No significant new hemorrhage. These results were called by telephone at the time of interpretation on 12/04/2020 at 6:04 pm to provider [facility] , who verbally acknowledged these results. Electronically Signed   By: San Morelle M.D.   On: 12/04/2020 18:04   MR BRAIN W WO CONTRAST  Result Date: 12/04/2020 CLINICAL DATA:  Progressive left upper and lower extremity weakness. Status post resection of brain tumor. EXAM: MRI HEAD WITHOUT AND WITH CONTRAST TECHNIQUE: Multiplanar, multiecho pulse sequences of the brain and surrounding structures were obtained without and with intravenous contrast. CONTRAST:  7.52mL GADAVIST GADOBUTROL 1 MMOL/ML IV SOLN COMPARISON:  CT head without contrast 12/04/2020. MR head without and with contrast 11/26/2020 and 11/23/2020. FINDINGS: Brain: The surgical cavity has expanded. Peripheral enhancement is noted. The  cavity measures 3.0 x 3.5 x 3.6 cm. Marked surrounding edema has progressed, as seen on the previous CT scan. This results in effacement of the sulci and partial effacement the right lateral ventricle. Midline shift is 3 mm right to left. Progressive dural enhancement is reactive over the right hemisphere. No distal enhancement is present. Cortical and subcortical edema is present along the medial left frontal lobe. 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 The brainstem and cerebellum are otherwise unremarkable. The internal auditory canals are within normal limits. Vascular: Flow is present in the major intracranial arteries. Skull and upper cervical spine: The craniocervical junction is normal. Upper cervical spine is within normal limits. Marrow signal is unremarkable. Sinuses/Orbits: The paranasal sinuses and mastoid air cells are clear. The globes and orbits are within normal limits. IMPRESSION: 1. Progressive enlargement of a surgical cavity with peripheral enhancement. Given the rapid change in thick peripheral enhancement, this is most concerning for infection. 2. Stable blood products. 3. No evidence for significant tumor. 4.  Progressive surrounding edema and mass effect with 3 mm right to left midline shift. 5. Stable cortical and subcortical edema in the medial left frontal lobe. 6. 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 These results were called by telephone at the time of interpretation on 12/04/2020 at 9:16 pm to provider [facility], who verbally acknowledged these results. Electronically Signed   By: San Morelle M.D.   On: 12/04/2020 21:17    EKG: I independently viewed the EKG done and my findings are as followed: Normal sinus rhythm at a rate of 82 beats per minute  Assessment/Plan Present on Admission:  Cerebral edema ([place])  Principal Problem:   Cerebral edema ([place]) Active Problems:   Hyponatremia   Essential  hypertension   Seizures ([facility])   S/P craniotomy  Left-sided weakness possibly secondary to cerebral edema with presumed cerebral infarction S/p craniotomy (11/25/2020) MRI of head showed progressive surrounding edema and mass-effect with 3 mm right to left midline shift. Continue IV Decadron 10 mg every 6 hours per neurosurgery recommendation Per ED medical record, no recommendation for IV antibiotics at this time per surgical team Continue fall precaution and neurochecks Neurosurgery following and we shall await further recommendation  Essential hypertension Continue Micardis  Seizures Continue Keppra per home regimen  DVT prophylaxis: SCDs  Code Status: Full code  Family Communication: None at bedside  Disposition Plan:  Patient is from:                        home Anticipated DC to:                   SNF or family members home Anticipated DC date:               2-3 days Anticipated DC barriers:         Patient requires inpatient management due to left-sided weakness pending further management and work-up  Consults called: Neurosurgery  Admission status: Inpatient    Bernadette Hoit MD [facility]  12/05/2020, 2:32 AM

## 2020-12-04 NOTE — ED Triage Notes (Signed)
Patient referred to [facility] by Dr Zada Finders for evaluation of left arm and left leg weakness that started on Sunday and have progressed since then. 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 Patient alert, oriented, and in no apparent distress at this time.

## 2020-12-04 NOTE — ED Notes (Signed)
Pt back from MRI 

## 2020-12-04 NOTE — ED Provider Notes (Signed)
[facility] EMERGENCY DEPARTMENT Provider Note   CSN: [id number] Arrival date & time: 12/04/20  1413     History Chief Complaint  Patient presents with   Extremity Weakness    Erika Mitchell is a 65 y.o. female.   Extremity Weakness Pertinent negatives include no chest pain, no abdominal pain, no headaches and no shortness of breath.  65 year old female with a PMH significant for HTN vestibular neuritis who presents to the ED with complaints of left-sided weakness.  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  She underwent uncomplicated craniotomy and resection on 10/19 with neurosurgery and was discharged home on Keppra.  She has upcoming follow-up with neurosurgery in the next week.  Patient states that she had right-sided facial swelling after she was discharged home starting on 11/28/2020.  This is since resolved.  Beginning on Saturday, she began to have progressive weakness of the LUE and LLE.  It has worsened and the patient now has no function of her LUE.  She is having difficulty ambulating secondary to the weakness of the LLE.  Her husband notes that she appeared to be eating more slowly than usual.  She denies any headaches, dizziness or lightheadedness, fevers or chills, slurred speech, confusion, photophobia or phonophobia, paresthesias, or any other complaints.  She is not currently taking a blood thinner.  She denies any new injuries or falls.    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/22/2013    Past  Surgical History:  Procedure Laterality Date   APPLICATION OF CRANIAL NAVIGATION Right 11/25/2020   Procedure: APPLICATION OF CRANIAL NAVIGATION;  Surgeon: Judith Part, MD;  Location: [place];  Service: Neurosurgery;  Laterality: Right;   CHOLECYSTECTOMY  2017   CRANIOTOMY Right 11/25/2020   Procedure: CRANIOTOMY FOR  TUMOR RESECTION WITH Lucky Rathke;  Surgeon: Judith Part, MD;  Location: [place];  Service: Neurosurgery;  Laterality: Right;   laproscopy     surgery for endometriosisi   OOPHORECTOMY  1974&1990   rt then left ovary     OB History   No obstetric history on file.     Family History  Problem Relation Age of Onset   Lung cancer Mother    Aneurysm Father    Heart disease Sister    Atrial fibrillation Brother    Colon cancer Neg Hx     Social History   Tobacco Use   Smoking status: Former    Types: Cigarettes    Quit date: 02/08/1996    Years since quitting: 24.8   Smokeless tobacco: Never  Vaping Use   Vaping Use: Never used  Substance Use Topics   Alcohol use: Yes    Alcohol/week: 2.0 standard drinks    Types: 2 Glasses of wine per week   Drug use: No    Home Medications Prior to Admission medications   Medication Sig Start Date End Date Taking? Authorizing Provider  acetaminophen (TYLENOL) 500 MG tablet Take 1,000 mg  by mouth every 6 (six) hours as needed for moderate pain.   Yes [provider]  ALPRAZolam (XANAX) 0.25 MG tablet Take 4.5 tablet daily for vestibular neuritis Patient taking differently: Take 1.125 mg by mouth daily as needed (Vestibular neuritis). Patient states she takes 4 of the 0.25 mg tablets and then splits one 0.25 mg tablet to make total of 1.125 mg daily for vestibular neuritis 06/26/19  Yes Lomax, Amy, NP  HYDROcodone-acetaminophen (NORCO/VICODIN) 5-325 MG tablet Take 1-2 tablets by mouth every 4 (four) hours as needed for moderate pain. 11/26/20  Yes Judith Part, MD  levETIRAcetam (KEPPRA) 500 MG tablet  Take 1 tablet (500 mg total) by mouth 2 (two) times daily. 11/26/20  Yes Judith Part, MD  telmisartan (MICARDIS) 40 MG tablet Take 40 mg by mouth daily.   Yes [provider]  telmisartan (MICARDIS) 80 MG tablet Take 80 mg by mouth daily. 12/02/20   [provider]    Allergies    Epinephrine, Azithromycin, and Motrin [ibuprofen]  Review of Systems   Review of Systems  Constitutional:  Positive for fatigue. Negative for activity change, appetite change, chills and fever.  HENT:  Positive for facial swelling. Negative for ear pain, sinus pain, sore throat and trouble swallowing.   Eyes:  Negative for photophobia, pain, redness and visual disturbance.  Respiratory:  Negative for cough and shortness of breath.   Cardiovascular:  Negative for chest pain, palpitations and leg swelling.  Gastrointestinal:  Negative for abdominal distention, abdominal pain, constipation, diarrhea, nausea and vomiting.  Genitourinary:  Negative for dysuria and hematuria.  Musculoskeletal:  Positive for extremity weakness. Negative for arthralgias, back pain, neck pain and neck stiffness.  Skin:  Positive for wound. Negative for color change, pallor and rash.  Neurological:  Positive for speech difficulty and weakness. Negative for dizziness, tremors, seizures, syncope, facial asymmetry, light-headedness, numbness and headaches.  Hematological:  Does not bruise/bleed easily.  Psychiatric/Behavioral:  Negative for confusion. The patient is not nervous/anxious.   All other systems reviewed and are negative.  Physical Exam Updated Vital Signs BP (!) 100/50   Pulse 64   Temp 99.1 F (37.3 C) (Oral)   Resp 19   SpO2 96%   Physical Exam Vitals and nursing note reviewed.  Constitutional:      General: She is not in acute distress.    Appearance: Normal appearance. She is well-developed and normal weight. She is not ill-appearing, toxic-appearing or diaphoretic.  HENT:     Head:  Normocephalic and atraumatic.     Jaw: There is normal jaw occlusion.     Comments: Transverse postsurgical scar C/D/I    Right Ear: External ear normal.     Left Ear: External ear normal.     Nose: Nose normal.     Mouth/Throat:     Mouth: Mucous membranes are moist.     Pharynx: Oropharynx is clear.  Eyes:     General: No scleral icterus.    Extraocular Movements: Extraocular movements intact.     Conjunctiva/sclera: Conjunctivae normal.     Pupils: Pupils are equal, round, and reactive to light.  Cardiovascular:     Rate and Rhythm: Normal rate and regular rhythm.     Pulses: Normal pulses.     Heart sounds: Normal heart sounds. No murmur heard. Pulmonary:     Effort: Pulmonary effort is normal. No respiratory distress.     Breath sounds: Normal breath sounds.  Abdominal:     Palpations:  Abdomen is soft.     Tenderness: There is no abdominal tenderness.  Musculoskeletal:        General: No signs of injury.     Cervical back: Normal range of motion and neck supple. No rigidity or tenderness.     Right lower leg: No edema.     Left lower leg: No edema.  Lymphadenopathy:     Cervical: No cervical adenopathy.  Skin:    General: Skin is warm and dry.     Capillary Refill: Capillary refill takes less than 2 seconds.     Coloration: Skin is pale.  Neurological:     Mental Status: She is oriented to person, place, and time.     GCS: GCS eye subscore is 4. GCS verbal subscore is 5. GCS motor subscore is 6.     Cranial Nerves: Facial asymmetry present. No cranial nerve deficit or dysarthria.     Sensory: Sensation is intact. No sensory deficit.     Motor: Weakness and abnormal muscle tone present. No tremor, atrophy or seizure activity.     Coordination: Coordination is intact. Coordination normal. Finger-Nose-Finger Test normal.     Gait: Gait abnormal.     Comments: Left sided facial droop.  Strength 0 out of 5 in LUE, 3/5 in LLE.  Sensation intact.  Vascularly intact.  No signs  of trauma.  Psychiatric:        Mood and Affect: Mood normal.        Behavior: Behavior normal. Behavior is cooperative.    ED Results / Procedures / Treatments   Labs (all labs ordered are listed, but only abnormal results are displayed) Labs Reviewed  COMPREHENSIVE METABOLIC PANEL - Abnormal; Notable for the following components:      Result Value   Sodium 134 (*)    Total Protein 6.4 (*)    All other components within normal limits  RESP PANEL BY RT-PCR (FLU A&B, COVID) ARPGX2  CBC WITH DIFFERENTIAL/PLATELET  PROTIME-INR  COMPREHENSIVE METABOLIC PANEL  CBC  PROTIME-INR  APTT  MAGNESIUM  PHOSPHORUS    EKG None  Radiology CT HEAD WO CONTRAST (5MM)  Result Date: 12/04/2020 CLINICAL DATA:  Neuro deficit, acute, stroke suspected. Status post tumor removal. Left-sided weakness and facial droop. EXAM: CT HEAD WITHOUT CONTRAST TECHNIQUE: Contiguous axial images were obtained from the base of the skull through the vertex without intravenous contrast. COMPARISON:  CT head without contrast 11/23/2020. MR head without and with contrast 11/26/2020. FINDINGS: Brain: Patient is status post right frontal craniotomy. Gas is present surgical cavity. Marked edema surrounds the operative site. The extent of surrounding vasogenic edema has increased. There is now mass effect with effacement of the sulci. No significant midline shift is present. Minimal blood products are again noted within the operative cavity. No significant new hemorrhage is present. The ventricles are of normal size. No significant extra-axial fluid collection is present. The brainstem and cerebellum are within normal limits. Vascular: No hyperdense vessel or unexpected calcification. Skull: Right frontal craniotomy.  Calvarium is otherwise intact. Sinuses/Orbits: The paranasal sinuses and mastoid air cells are clear. The globes and orbits are within normal limits. IMPRESSION: 1. Status post right frontal craniotomy for tumor  resection. 2. Marked edema surrounding the operative site has increased. 3. There is now mass effect with effacement of the sulci. 4. No significant midline shift. 5. Minimal blood products are again noted within the operative cavity. 6. No significant new hemorrhage. These results were called by telephone at the  time of interpretation on 12/04/2020 at 6:04 pm to provider [facility] , who verbally acknowledged these results. Electronically Signed   By: San Morelle M.D.   On: 12/04/2020 18:04   MR BRAIN W WO CONTRAST  Result Date: 12/04/2020 CLINICAL DATA:  Progressive left upper and lower extremity weakness. Status post resection of brain tumor. EXAM: MRI HEAD WITHOUT AND WITH CONTRAST TECHNIQUE: Multiplanar, multiecho pulse sequences of the brain and surrounding structures were obtained without and with intravenous contrast. CONTRAST:  7.8mL GADAVIST GADOBUTROL 1 MMOL/ML IV SOLN COMPARISON:  CT head without contrast 12/04/2020. MR head without and with contrast 11/26/2020 and 11/23/2020. FINDINGS: Brain: The surgical cavity has expanded. Peripheral enhancement is noted. The cavity measures 3.0 x 3.5 x 3.6 cm. Marked surrounding edema has progressed, as seen on the previous CT scan. This results in effacement of the sulci and partial effacement the right lateral ventricle. Midline shift is 3 mm right to left. Progressive dural enhancement is reactive over the right hemisphere. No distal enhancement is present. Cortical and subcortical edema is present along the medial left frontal lobe. 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 The brainstem and cerebellum are otherwise unremarkable. The internal auditory canals are within normal limits. Vascular: Flow is present in the major intracranial arteries. Skull and upper cervical spine: The craniocervical junction is normal. Upper cervical spine is within normal limits. Marrow signal is unremarkable. Sinuses/Orbits: The  paranasal sinuses and mastoid air cells are clear. The globes and orbits are within normal limits. IMPRESSION: 1. Progressive enlargement of a surgical cavity with peripheral enhancement. Given the rapid change in thick peripheral enhancement, this is most concerning for infection. 2. Stable blood products. 3. No evidence for significant tumor. 4. Progressive surrounding edema and mass effect with 3 mm right to left midline shift. 5. Stable cortical and subcortical edema in the medial left frontal lobe. 6. 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 These results were called by telephone at the time of interpretation on 12/04/2020 at 9:16 pm to provider [facility], who verbally acknowledged these results. Electronically Signed   By: San Morelle M.D.   On: 12/04/2020 21:17    Procedures Procedures   Medications Ordered in ED Medications  dexamethasone (DECADRON) injection 10 mg (10 mg Intravenous Given 12/05/20 0018)  LORazepam (ATIVAN) tablet 1 mg (1 mg Oral Given 12/04/20 1956)  gadobutrol (GADAVIST) 1 MMOL/ML injection 7.9 mL (7.9 mLs Intravenous Contrast Given 12/04/20 2045)    ED Course  I have reviewed the triage vital signs and the nursing notes.  Pertinent labs & imaging results that were available during my care of the patient were reviewed by me and considered in my medical decision making (see chart for details).    MDM Rules/Calculators/A&P                           LUTISHA KNOCHE is a 65 y.o. female presenting with left sided weakness. Initial VS wnl.  Labs: Mild hyponatremia, CBC and CMP otherwise unremarkable.  COVID/flu pending.  Coags WNL.  Imaging: CT head notable for postoperative edema, increased from prior with mass-effect without midline shift or no significant new hemorrhage. MRI brain notable for enlargement of surgical cavity with enhancement, progressive edema, and mass-effect with 3 mm midline shift. Imaging was reviewed by radiology  and personally by me.  DDX considered: Intracranial bleed, head injury, meningitis, encephalopathy, complex migraine, cervical radiculopathy, sepsis. History, examination,  and objective data most consistent with post craniotomy hemiparesis, possible postoperative inflammation versus infection.  No sign of new bleeding on multiple imaging studies.  No signs of trauma on exam.  No systemic infectious signs or symptoms.    Medications: Medications  dexamethasone (DECADRON) injection 10 mg (10 mg Intravenous Given 12/05/20 0018)  LORazepam (ATIVAN) tablet 1 mg (1 mg Oral Given 12/04/20 1956)  gadobutrol (GADAVIST) 1 MMOL/ML injection 7.9 mL (7.9 mLs Intravenous Contrast Given 12/04/20 2045)     Neurosurgery consulted, who recommends Decadron q6 and reevaluation for possible washout in the morning.  No recommendation for antibiotics at this time from the surgical team.  Re-evaluated prior to admission. Hemodynamically stable and in no acute distress.  Admitted to hospitalist in stable condition.  Neurosurgery will continue to follow, and will place note with recommendations. Patient understands and agrees with the plan.  Family updated at the bedside.   The plan for this patient was discussed with my attending physician, who voiced agreement and who oversaw evaluation and treatment of this patient.     Note: Estate manager/land agent was used in the creation of this note.  Final Clinical Impression(s) / ED Diagnoses Final diagnoses:  Weakness    Rx / DC Orders ED Discharge Orders     None        Cherly Hensen, DO 12/05/20 0102    Erika Fuse, MD 12/09/20 [id number]

## 2020-12-04 NOTE — ED Provider Notes (Signed)
Emergency Medicine Provider Triage Evaluation Note  Erika Mitchell , a 65 y.o. female  was evaluated in triage.  Pt complains of left arm weakness since Saturday she has had worsening weakness. She has weakness in the left leg.  Her son spoke with Dr. Zada Finders and he referred her here.  She reports slow speech. No pain currently.  No fevers.    She is taking her keppra.    Review of Systems  Positive: One weak of left sided weakness, slow speech Negative: Fevers, headache  Physical Exam  BP 130/78 (BP Location: Right Arm)   Pulse 78   Temp 99.1 F (37.3 C) (Oral)   Resp 16   SpO2 100%  Gen:   Awake, no distress   Resp:  Normal effort  MSK:   Left sided difficulty moving her arm.  She is unable to perform any antigravity movements with the LUE and I do not detect any muscle activation.  She is able to lift the left leg however it is weaker than the right.  Slight left sided facial droop.   Other:  Speech is slowed.   Medical Decision Making  Medically screening exam initiated at 3:21 PM.  Appropriate orders placed.  Erika Mitchell was informed that the remainder of the evaluation will be completed by another provider, this initial triage assessment does not replace that evaluation, and the importance of remaining in the ED until their evaluation is complete.  Note: Portions of this report may have been transcribed using voice recognition software. Every effort was made to ensure accuracy; however, inadvertent computerized transcription errors may be present    Lorin Glass, PA-C 12/04/20 1539    Regan Lemming, MD 12/04/20 1900

## 2020-12-04 NOTE — ED Notes (Signed)
Patient transported to MRI 

## 2020-12-04 NOTE — Progress Notes (Signed)
Pt at MRI when I came by to see her, discussed w/ the pt's son, no issues with the wound, having worsening right sided weakness and some aphasia towards the end of the day. MRI with some enhancement in the resection cavity, no diffusion changes c/w abscess. Difficult situation, could be post-op edema vs early cerebritis vs early / aggressive recurrence.   -recommend high dose steroids - dex 10q6, will monitor her closely as an inpatient and, if no improvement, repeat imaging to see if there's evolution in the enhancement c/w infection versus improvement in exam from improvement in edema, can also follow her wound closely to correlate with MRI findings

## 2020-12-05 DIAGNOSIS — I10 Essential (primary) hypertension: Secondary | ICD-10-CM

## 2020-12-05 DIAGNOSIS — Z9889 Other specified postprocedural states: Secondary | ICD-10-CM

## 2020-12-05 DIAGNOSIS — E871 Hypo-osmolality and hyponatremia: Secondary | ICD-10-CM | POA: Insufficient documentation

## 2020-12-05 DIAGNOSIS — R569 Unspecified convulsions: Secondary | ICD-10-CM

## 2020-12-05 LAB — COMPREHENSIVE METABOLIC PANEL
ALT: 20 U/L (ref 0–44)
AST: 15 U/L (ref 15–41)
Albumin: 3.8 g/dL (ref 3.5–5.0)
Alkaline Phosphatase: 58 U/L (ref 38–126)
Anion gap: 11 (ref 5–15)
BUN: 9 mg/dL (ref 8–23)
CO2: 22 mmol/L (ref 22–32)
Calcium: 9.7 mg/dL (ref 8.9–10.3)
Chloride: 105 mmol/L (ref 98–111)
Creatinine, Ser: 0.67 mg/dL (ref 0.44–1.00)
GFR, Estimated: >60 mL/min (ref >60)
Glucose, Bld: 114 mg/dL — ABNORMAL HIGH (ref 70–99)
Potassium: 3.8 mmol/L (ref 3.5–5.1)
Sodium: 138 mmol/L (ref 135–145)
Total Bilirubin: 1 mg/dL (ref 0.3–1.2)
Total Protein: 6.7 g/dL (ref 6.5–8.1)

## 2020-12-05 LAB — RESP PANEL BY RT-PCR (FLU A&B, COVID) ARPGX2
Influenza A by PCR: NEGATIVE
Influenza B by PCR: NEGATIVE
SARS Coronavirus 2 by RT PCR: NEGATIVE

## 2020-12-05 LAB — CBC
HCT: 42.3 % (ref 36.0–46.0)
Hemoglobin: 14.5 g/dL (ref 12.0–15.0)
MCH: 31.7 pg (ref 26.0–34.0)
MCHC: 34.3 g/dL (ref 30.0–36.0)
MCV: 92.6 fL (ref 80.0–100.0)
Platelets: 260 10*3/uL (ref 150–400)
RBC: 4.57 MIL/uL (ref 3.87–5.11)
RDW: 11.9 % (ref 11.5–15.5)
WBC: 5.9 10*3/uL (ref 4.0–10.5)
nRBC: 0 % (ref 0.0–0.2)

## 2020-12-05 LAB — MAGNESIUM: Magnesium: 2 mg/dL (ref 1.7–2.4)

## 2020-12-05 LAB — APTT: aPTT: 29 seconds (ref 24–36)

## 2020-12-05 LAB — PHOSPHORUS: Phosphorus: 3.2 mg/dL (ref 2.5–4.6)

## 2020-12-05 LAB — PROTIME-INR
INR: 1 (ref 0.8–1.2)
Prothrombin Time: 13.5 seconds (ref 11.4–15.2)

## 2020-12-05 MED ORDER — METOPROLOL TARTRATE 25 MG PO TABS
12.5000 mg | ORAL_TABLET | Freq: Two times a day (BID) | ORAL | Status: DC
  Administered 2020-12-05 – 2020-12-09 (×8): 12.5 mg via ORAL
  Filled 2020-12-05 (×8): qty 1

## 2020-12-05 MED ORDER — IRBESARTAN 150 MG PO TABS
150.0000 mg | ORAL_TABLET | Freq: Every day | ORAL | Status: DC
  Administered 2020-12-05 – 2020-12-09 (×5): 150 mg via ORAL
  Filled 2020-12-05 (×5): qty 1

## 2020-12-05 MED ORDER — DEXAMETHASONE SODIUM PHOSPHATE 4 MG/ML IJ SOLN
4.0000 mg | Freq: Four times a day (QID) | INTRAMUSCULAR | Status: DC
  Administered 2020-12-05 – 2020-12-09 (×16): 4 mg via INTRAVENOUS
  Filled 2020-12-05 (×15): qty 1

## 2020-12-05 MED ORDER — LEVETIRACETAM 500 MG PO TABS
500.0000 mg | ORAL_TABLET | Freq: Two times a day (BID) | ORAL | Status: DC
  Administered 2020-12-05 – 2020-12-09 (×9): 500 mg via ORAL
  Filled 2020-12-05 (×9): qty 1

## 2020-12-05 NOTE — ED Notes (Signed)
Pt ambulatory to the bathroom with 1 person assist and back to room. No acute changes noted. Will continue to monitor.

## 2020-12-05 NOTE — ED Notes (Signed)
Went in to assess the patient. Pts husband brought patient a ginger ale and pt reports having a couple of sips. The bottle seams to have very minimal gingerale missing. Pt asked not to eat or drink anything else at this time.

## 2020-12-05 NOTE — Progress Notes (Signed)
PROGRESS NOTE    Erika Mitchell  CBJ:[id number] DOB: 26-Sep-1955 DOA: 12/04/2020 PCP: Ginger Organ., MD    Chief Complaint  Patient presents with   Extremity Weakness    Brief Narrative:  Erika Mitchell is a 65 y.o. 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) who presents to the emergency department due to 5-6-day onset of progressive weakness of left upper and lower extremities  Subjective:  Not able to use left arm, able to walk with one person assist Denies pain, no dysarthria , no dysphagia, no bowel and bladder issues RN reports noticed patient drop her sats into the 80's when she sleeps, no hypoxia when she is awake, she denies chest pain, no sob Husband at bedside  Assessment & Plan:   Principal Problem:   Cerebral edema ([place]) Active Problems:   Essential hypertension   Seizures (HCC)   S/P craniotomy  Left-sided weakness possibly secondary to cerebral edema, S/p craniotomy (11/25/2020) -MRI of head showed progressive surrounding edema and mass-effect with 3 mm right to left midline shift. -Continue IV Decadron 10 mg every 6 hours per neurosurgery recommendation -continue keppra  - management per neurosurgery  Essential hypertension Continue Micardis She is noted to have sinus tachycardia and hypertension while on micardia, add lopressor with holding parameters   Sleep apnea? Per RN "Pt SpO2 dropped to 89% while sleeping. Pt placed on 2 L [place] " May need home 02 at night    Unresulted Labs (From admission, onward)    None         DVT prophylaxis: SCDs Start: 12/05/20 0034   Code Status:full Family Communication: husband at bedside  Disposition:   Status is: Inpatient   Dispo: The patient is from: home              Anticipated d/c is to: pending therapy eval              Anticipated d/c date is: TBD, needs neurosurgery clearance                Consultants:  neurosurgery  Procedures:  none  Antimicrobials:    Anti-infectives (From admission, onward)    None          Objective: Vitals:   12/05/20 0500 12/05/20 0600 12/05/20 0800 12/05/20 1000  BP: 133/78 (!) 129/95 (!) 144/91 (!) 161/80  Pulse: 88 95 93 100  Resp: (!) 21 19 (!) 23 (!) 23  Temp:      TempSrc:      SpO2: 94% 99% 100% 97%   No intake or output data in the 24 hours ending 12/05/20 1152 There were no vitals filed for this visit.  Examination:  General exam: alert, awake, communicative,calm, NAD Respiratory system: Clear to auscultation. Respiratory effort normal. Cardiovascular system:  RRR.  Gastrointestinal system: Abdomen is nondistended, soft and nontender.  Normal bowel sounds heard. Central nervous system: Alert and oriented.  Left arm placcid. Extremities:  no edema Skin: No rashes, lesions or ulcers Psychiatry: Judgement and insight appear normal. Mood & affect appropriate.     Data Reviewed: I have personally reviewed following labs and imaging studies  CBC: Recent Labs  Lab 12/04/20 1532 12/05/20 0507  WBC 5.6 5.9  NEUTROABS 3.2  --   HGB 13.8 14.5  HCT 41.7 42.3  MCV 93.7 92.6  PLT 267 160    Basic Metabolic Panel: Recent Labs  Lab 12/04/20 1532 12/05/20 0507  NA 134* 138  K 3.5 3.8  CL 101 105  CO2 25 22  GLUCOSE 97 114*  BUN 8 9  CREATININE 0.75 0.67  CALCIUM 9.1 9.7  MG  --  2.0  PHOS  --  3.2    GFR: Estimated Creatinine Clearance: 78.1 mL/min (by C-G formula based on SCr of 0.67 mg/dL).  Liver Function Tests: Recent Labs  Lab 12/04/20 1532 12/05/20 0507  AST 16 15  ALT 21 20  ALKPHOS 55 58  BILITOT 0.6 1.0  PROT 6.4* 6.7  ALBUMIN 3.8 3.8    CBG: No results for input(s): GLUCAP in the last 168 hours.   Recent Results (from the past 240 hour(s))  Resp Panel by RT-PCR (Flu A&B, Covid) Nasopharyngeal Swab     Status: None   Collection Time: 12/05/20 12:04 AM   Specimen: Nasopharyngeal  Swab; Nasopharyngeal(NP) swabs in vial transport medium  Result Value Ref Range Status   SARS Coronavirus 2 by RT PCR NEGATIVE NEGATIVE Final    Comment: (NOTE) SARS-CoV-2 target nucleic acids are NOT DETECTED.  The SARS-CoV-2 RNA is generally detectable in upper respiratory specimens during the acute phase of infection. The lowest concentration of SARS-CoV-2 viral copies this assay can detect is 138 copies/mL. A negative result does not preclude SARS-Cov-2 infection and should not be used as the sole basis for treatment or other patient management decisions. A negative result may occur with  improper specimen collection/handling, submission of specimen other than nasopharyngeal swab, presence of viral mutation(s) within the areas targeted by this assay, and inadequate number of viral copies(<138 copies/mL). A negative result must be combined with clinical observations, patient history, and epidemiological information. The expected result is Negative.  Fact Sheet for Patients:  EntrepreneurPulse.com.au  Fact Sheet for Healthcare Providers:  IncredibleEmployment.be  This test is no t yet approved or cleared by the [place] FDA and  has been authorized for detection and/or diagnosis of SARS-CoV-2 by FDA under an Emergency Use Authorization (EUA). This EUA will remain  in effect (meaning this test can be used) for the duration of the COVID-19 declaration under Section 564(b)(1) of the Act, 21 U.S.C.section 360bbb-3(b)(1), unless the authorization is terminated  or revoked sooner.       Influenza A by PCR NEGATIVE NEGATIVE Final   Influenza B by PCR NEGATIVE NEGATIVE Final    Comment: (NOTE) The Xpert Xpress SARS-CoV-2/FLU/RSV plus assay is intended as an aid in the diagnosis of influenza from Nasopharyngeal swab specimens and should not be used as a sole basis for treatment. Nasal washings and aspirates are unacceptable for Xpert Xpress  SARS-CoV-2/FLU/RSV testing.  Fact Sheet for Patients: EntrepreneurPulse.com.au  Fact Sheet for Healthcare Providers: IncredibleEmployment.be  This test is not yet approved or cleared by the [place] FDA and has been authorized for detection and/or diagnosis of SARS-CoV-2 by FDA under an Emergency Use Authorization (EUA). This EUA will remain in effect (meaning this test can be used) for the duration of the COVID-19 declaration under Section 564(b)(1) of the Act, 21 U.S.C. section 360bbb-3(b)(1), unless the authorization is terminated or revoked.  Performed at [facility], [place] [address] [place], [place] [address]          Radiology Studies: CT HEAD WO CONTRAST (5MM)  Result Date: 12/04/2020 CLINICAL DATA:  Neuro deficit, acute, stroke suspected. Status post tumor removal. Left-sided weakness and facial droop. EXAM: CT HEAD WITHOUT CONTRAST TECHNIQUE: Contiguous axial images were obtained from the base of the skull through  the vertex without intravenous contrast. COMPARISON:  CT head without contrast 11/23/2020. MR head without and with contrast 11/26/2020. FINDINGS: Brain: Patient is status post right frontal craniotomy. Gas is present surgical cavity. Marked edema surrounds the operative site. The extent of surrounding vasogenic edema has increased. There is now mass effect with effacement of the sulci. No significant midline shift is present. Minimal blood products are again noted within the operative cavity. No significant new hemorrhage is present. The ventricles are of normal size. No significant extra-axial fluid collection is present. The brainstem and cerebellum are within normal limits. Vascular: No hyperdense vessel or unexpected calcification. Skull: Right frontal craniotomy.  Calvarium is otherwise intact. Sinuses/Orbits: The paranasal sinuses and mastoid air cells are clear. The globes and orbits are within normal limits.  IMPRESSION: 1. Status post right frontal craniotomy for tumor resection. 2. Marked edema surrounding the operative site has increased. 3. There is now mass effect with effacement of the sulci. 4. No significant midline shift. 5. Minimal blood products are again noted within the operative cavity. 6. No significant new hemorrhage. These results were called by telephone at the time of interpretation on 12/04/2020 at 6:04 pm to provider [facility] , who verbally acknowledged these results. Electronically Signed   By: San Morelle M.D.   On: 12/04/2020 18:04   MR BRAIN W WO CONTRAST  Result Date: 12/04/2020 CLINICAL DATA:  Progressive left upper and lower extremity weakness. Status post resection of brain tumor. EXAM: MRI HEAD WITHOUT AND WITH CONTRAST TECHNIQUE: Multiplanar, multiecho pulse sequences of the brain and surrounding structures were obtained without and with intravenous contrast. CONTRAST:  7.56mL GADAVIST GADOBUTROL 1 MMOL/ML IV SOLN COMPARISON:  CT head without contrast 12/04/2020. MR head without and with contrast 11/26/2020 and 11/23/2020. FINDINGS: Brain: The surgical cavity has expanded. Peripheral enhancement is noted. The cavity measures 3.0 x 3.5 x 3.6 cm. Marked surrounding edema has progressed, as seen on the previous CT scan. This results in effacement of the sulci and partial effacement the right lateral ventricle. Midline shift is 3 mm right to left. Progressive dural enhancement is reactive over the right hemisphere. No distal enhancement is present. Cortical and subcortical edema is present along the medial left frontal lobe. 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 The brainstem and cerebellum are otherwise unremarkable. The internal auditory canals are within normal limits. Vascular: Flow is present in the major intracranial arteries. Skull and upper cervical spine: The craniocervical junction is normal. Upper cervical spine is within  normal limits. Marrow signal is unremarkable. Sinuses/Orbits: The paranasal sinuses and mastoid air cells are clear. The globes and orbits are within normal limits. IMPRESSION: 1. Progressive enlargement of a surgical cavity with peripheral enhancement. Given the rapid change in thick peripheral enhancement, this is most concerning for infection. 2. Stable blood products. 3. No evidence for significant tumor. 4. Progressive surrounding edema and mass effect with 3 mm right to left midline shift. 5. Stable cortical and subcortical edema in the medial left frontal lobe. 6. 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 These results were called by telephone at the time of interpretation on 12/04/2020 at 9:16 pm to provider [facility], who verbally acknowledged these results. Electronically Signed   By: San Morelle M.D.   On: 12/04/2020 21:17        Scheduled Meds:  dexamethasone (DECADRON) injection  4 mg Intravenous Q6H   irbesartan  150 mg Oral Daily   levETIRAcetam  500 mg  Oral BID   Continuous Infusions:   LOS: 1 day   Time spent: 45mins Greater than 50% of this time was spent in counseling, explanation of diagnosis, planning of further management, and coordination of care.   Voice Recognition Viviann Spare dictation system was used to create this note, attempts have been made to correct errors. Please contact the author with questions and/or clarifications.   Florencia Reasons, MD PhD FACP [facility]  Available via Epic secure chat 7am-7pm for nonurgent issues Please page for urgent issues To page the attending provider between 7A-7P or the covering provider during after hours 7P-7A, please log into the web site [email or web address] and access using universal [place] password for that web site. If you do not have the password, please call the hospital operator.    12/05/2020, 11:52 AM

## 2020-12-05 NOTE — ED Notes (Signed)
ED TO INPATIENT HANDOFF REPORT  ED Nurse Name and Phone #: Cindie Laroche [phone]  S Name/Age/Gender Erika Mitchell 65 y.o. female Room/Bed: 036C/036C  Code Status   Code Status: Full Code  Home/SNF/Other Home Patient oriented to: self, place, time, and situation Is this baseline? Yes   Triage Complete: Triage complete  Chief Complaint Cerebral edema [facility]) [G93.6]  Triage Note Patient referred to [facility] by Dr Zada Finders for evaluation of left arm and left leg weakness that started on Sunday and have progressed since then. 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 Patient alert, oriented, and in no apparent distress at this time.   Allergies Allergies  Allergen Reactions   Epinephrine Other (See Comments)    "dizziness, rapid heartbeat"   Azithromycin     dizziness   Motrin [Ibuprofen]     dizziness    Level of Care/Admitting Diagnosis ED Disposition     ED Disposition  Admit   Condition  --   [place]: [place] [[id number]]  Level of Care: Med-Surg [16]  May admit patient to [facility] or [facility] if equivalent level of care is available:: Yes  Covid Evaluation: Asymptomatic Screening Protocol (No Symptoms)  Diagnosis: Cerebral edema ([place]) [348.5.ICD-9-CM]  Admitting Physician: Bernadette Hoit [[id number]]  Attending Physician: Bernadette Hoit [[id number]]  Estimated length of stay: past midnight tomorrow  Certification:: I certify this patient will need inpatient services for at least 2 midnights          B Medical/Surgery History Past Medical History:  Diagnosis Date   Cancer ([place])    basal cell carcinoma on left arm left eye brow   Hypertension    Vertigo    Vestibular neuritis    right ear   Past Surgical History:  Procedure Laterality Date   APPLICATION OF CRANIAL NAVIGATION Right 11/25/2020   Procedure: APPLICATION OF CRANIAL NAVIGATION;  Surgeon: Judith Part, MD;  Location: [place];  Service:  Neurosurgery;  Laterality: Right;   CHOLECYSTECTOMY  2017   CRANIOTOMY Right 11/25/2020   Procedure: CRANIOTOMY FOR  TUMOR RESECTION WITH Lucky Rathke;  Surgeon: Judith Part, MD;  Location: [place];  Service: Neurosurgery;  Laterality: Right;   laproscopy     surgery for endometriosisi   OOPHORECTOMY  1974&1990   rt then left ovary     A IV Location/Drains/Wounds Patient Lines/Drains/Airways Status     Active Line/Drains/Airways     Name Placement date Placement time Site Days   Peripheral IV 12/04/20 20 G Right Antecubital 12/04/20  1749  Antecubital  1   Incision (Closed) 11/25/20 Head 11/25/20  2019  -- 10            Intake/Output Last 24 hours No intake or output data in the 24 hours ending 12/05/20 1305  Labs/Imaging Results for orders placed or performed during the hospital encounter of 12/04/20 (from the past 48 hour(s))  Comprehensive metabolic panel     Status: Abnormal   Collection Time: 12/04/20  3:32 PM  Result Value Ref Range   Sodium 134 (L) 135 - 145 mmol/L   Potassium 3.5 3.5 - 5.1 mmol/L   Chloride 101 98 - 111 mmol/L   CO2 25 22 - 32 mmol/L   Glucose, Bld 97 70 - 99 mg/dL    Comment: Glucose reference range applies only to samples taken after fasting for at least 8 hours.   BUN 8 8 - 23 mg/dL   Creatinine, Ser 0.75  0.44 - 1.00 mg/dL   Calcium 9.1 8.9 - 10.3 mg/dL   Total Protein 6.4 (L) 6.5 - 8.1 g/dL   Albumin 3.8 3.5 - 5.0 g/dL   AST 16 15 - 41 U/L   ALT 21 0 - 44 U/L   Alkaline Phosphatase 55 38 - 126 U/L   Total Bilirubin 0.6 0.3 - 1.2 mg/dL   GFR, Estimated >60 >60 mL/min    Comment: (NOTE) Calculated using the CKD-EPI Creatinine Equation (2021)    Anion gap 8 5 - 15    Comment: Performed at [facility] [address], [place], [place] [address]  CBC with Differential     Status: None   Collection Time: 12/04/20  3:32 PM  Result Value Ref Range   WBC 5.6 4.0 - 10.5 K/uL   RBC 4.45 3.87 - 5.11 MIL/uL   Hemoglobin 13.8 12.0 -  15.0 g/dL   HCT 41.7 36.0 - 46.0 %   MCV 93.7 80.0 - 100.0 fL   MCH 31.0 26.0 - 34.0 pg   MCHC 33.1 30.0 - 36.0 g/dL   RDW 12.1 11.5 - 15.5 %   Platelets 267 150 - 400 K/uL   nRBC 0.0 0.0 - 0.2 %   Neutrophils Relative % 56 %   Neutro Abs 3.2 1.7 - 7.7 K/uL   Lymphocytes Relative 35 %   Lymphs Abs 1.9 0.7 - 4.0 K/uL   Monocytes Relative 7 %   Monocytes Absolute 0.4 0.1 - 1.0 K/uL   Eosinophils Relative 1 %   Eosinophils Absolute 0.0 0.0 - 0.5 K/uL   Basophils Relative 1 %   Basophils Absolute 0.0 0.0 - 0.1 K/uL   Immature Granulocytes 0 %   Abs Immature Granulocytes 0.01 0.00 - 0.07 K/uL    Comment: Performed at [facility], [address] [place], [place] [address]  Protime-INR     Status: None   Collection Time: 12/04/20  3:32 PM  Result Value Ref Range   Prothrombin Time 12.7 11.4 - 15.2 seconds   INR 1.0 0.8 - 1.2    Comment: (NOTE) INR goal varies based on device and disease states. Performed at [facility], [place] [address], [place], [place] [address]   Resp Panel by RT-PCR (Flu A&B, Covid) Nasopharyngeal Swab     Status: None   Collection Time: 12/05/20 12:04 AM   Specimen: Nasopharyngeal Swab; Nasopharyngeal(NP) swabs in vial transport medium  Result Value Ref Range   SARS Coronavirus 2 by RT PCR NEGATIVE NEGATIVE    Comment: (NOTE) SARS-CoV-2 target nucleic acids are NOT DETECTED.  The SARS-CoV-2 RNA is generally detectable in upper respiratory specimens during the acute phase of infection. The lowest concentration of SARS-CoV-2 viral copies this assay can detect is 138 copies/mL. A negative result does not preclude SARS-Cov-2 infection and should not be used as the sole basis for treatment or other patient management decisions. A negative result may occur with  improper specimen collection/handling, submission of specimen other than nasopharyngeal swab, presence of viral mutation(s) within the areas targeted by this assay, and inadequate number of  viral copies(<138 copies/mL). A negative result must be combined with clinical observations, patient history, and epidemiological information. The expected result is Negative.  Fact Sheet for Patients:  EntrepreneurPulse.com.au  Fact Sheet for Healthcare Providers:  IncredibleEmployment.be  This test is no t yet approved or cleared by the [place] FDA and  has been authorized for detection and/or diagnosis of SARS-CoV-2 by  FDA under an Emergency Use Authorization (EUA). This EUA will remain  in effect (meaning this test can be used) for the duration of the COVID-19 declaration under Section 564(b)(1) of the Act, 21 U.S.C.section 360bbb-3(b)(1), unless the authorization is terminated  or revoked sooner.       Influenza A by PCR NEGATIVE NEGATIVE   Influenza B by PCR NEGATIVE NEGATIVE    Comment: (NOTE) The Xpert Xpress SARS-CoV-2/FLU/RSV plus assay is intended as an aid in the diagnosis of influenza from Nasopharyngeal swab specimens and should not be used as a sole basis for treatment. Nasal washings and aspirates are unacceptable for Xpert Xpress SARS-CoV-2/FLU/RSV testing.  Fact Sheet for Patients: EntrepreneurPulse.com.au  Fact Sheet for Healthcare Providers: IncredibleEmployment.be  This test is not yet approved or cleared by the [place] FDA and has been authorized for detection and/or diagnosis of SARS-CoV-2 by FDA under an Emergency Use Authorization (EUA). This EUA will remain in effect (meaning this test can be used) for the duration of the COVID-19 declaration under Section 564(b)(1) of the Act, 21 U.S.C. section 360bbb-3(b)(1), unless the authorization is terminated or revoked.  Performed at [facility], [place] [address], [place], [place] [address]   Comprehensive metabolic panel     Status: Abnormal   Collection Time: 12/05/20  5:07 AM  Result Value Ref Range   Sodium 138  135 - 145 mmol/L   Potassium 3.8 3.5 - 5.1 mmol/L   Chloride 105 98 - 111 mmol/L   CO2 22 22 - 32 mmol/L   Glucose, Bld 114 (H) 70 - 99 mg/dL    Comment: Glucose reference range applies only to samples taken after fasting for at least 8 hours.   BUN 9 8 - 23 mg/dL   Creatinine, Ser 0.67 0.44 - 1.00 mg/dL   Calcium 9.7 8.9 - 10.3 mg/dL   Total Protein 6.7 6.5 - 8.1 g/dL   Albumin 3.8 3.5 - 5.0 g/dL   AST 15 15 - 41 U/L   ALT 20 0 - 44 U/L   Alkaline Phosphatase 58 38 - 126 U/L   Total Bilirubin 1.0 0.3 - 1.2 mg/dL   GFR, Estimated >60 >60 mL/min    Comment: (NOTE) Calculated using the CKD-EPI Creatinine Equation (2021)    Anion gap 11 5 - 15    Comment: Performed at [facility] [address], [place], [place] [address]  CBC     Status: None   Collection Time: 12/05/20  5:07 AM  Result Value Ref Range   WBC 5.9 4.0 - 10.5 K/uL   RBC 4.57 3.87 - 5.11 MIL/uL   Hemoglobin 14.5 12.0 - 15.0 g/dL   HCT 42.3 36.0 - 46.0 %   MCV 92.6 80.0 - 100.0 fL   MCH 31.7 26.0 - 34.0 pg   MCHC 34.3 30.0 - 36.0 g/dL   RDW 11.9 11.5 - 15.5 %   Platelets 260 150 - 400 K/uL   nRBC 0.0 0.0 - 0.2 %    Comment: Performed at [facility], [place] [address] [place], [place] [address]  Protime-INR     Status: None   Collection Time: 12/05/20  5:07 AM  Result Value Ref Range   Prothrombin Time 13.5 11.4 - 15.2 seconds   INR 1.0 0.8 - 1.2    Comment: (NOTE) INR goal varies based on device and disease states. Performed at [facility], [place] [address] [place], [place] [address]   APTT  Status: None   Collection Time: 12/05/20  5:07 AM  Result Value Ref Range   aPTT 29 24 - 36 seconds    Comment: Performed at [facility] [address] [place], [place] [address]  Magnesium     Status: None   Collection Time: 12/05/20  5:07 AM  Result Value Ref Range   Magnesium 2.0 1.7 - 2.4 mg/dL    Comment: Performed at [facility] [address] [place], [place] [address]   Phosphorus     Status: None   Collection Time: 12/05/20  5:07 AM  Result Value Ref Range   Phosphorus 3.2 2.5 - 4.6 mg/dL    Comment: Performed at [facility] [address] [place], [place] [address]   CT HEAD WO CONTRAST (5MM)  Result Date: 12/04/2020 CLINICAL DATA:  Neuro deficit, acute, stroke suspected. Status post tumor removal. Left-sided weakness and facial droop. EXAM: CT HEAD WITHOUT CONTRAST TECHNIQUE: Contiguous axial images were obtained from the base of the skull through the vertex without intravenous contrast. COMPARISON:  CT head without contrast 11/23/2020. MR head without and with contrast 11/26/2020. FINDINGS: Brain: Patient is status post right frontal craniotomy. Gas is present surgical cavity. Marked edema surrounds the operative site. The extent of surrounding vasogenic edema has increased. There is now mass effect with effacement of the sulci. No significant midline shift is present. Minimal blood products are again noted within the operative cavity. No significant new hemorrhage is present. The ventricles are of normal size. No significant extra-axial fluid collection is present. The brainstem and cerebellum are within normal limits. Vascular: No hyperdense vessel or unexpected calcification. Skull: Right frontal craniotomy.  Calvarium is otherwise intact. Sinuses/Orbits: The paranasal sinuses and mastoid air cells are clear. The globes and orbits are within normal limits. IMPRESSION: 1. Status post right frontal craniotomy for tumor resection. 2. Marked edema surrounding the operative site has increased. 3. There is now mass effect with effacement of the sulci. 4. No significant midline shift. 5. Minimal blood products are again noted within the operative cavity. 6. No significant new hemorrhage. These results were called by telephone at the time of interpretation on 12/04/2020 at 6:04 pm to provider [facility] , who verbally acknowledged these results.  Electronically Signed   By: San Morelle M.D.   On: 12/04/2020 18:04   MR BRAIN W WO CONTRAST  Result Date: 12/04/2020 CLINICAL DATA:  Progressive left upper and lower extremity weakness. Status post resection of brain tumor. EXAM: MRI HEAD WITHOUT AND WITH CONTRAST TECHNIQUE: Multiplanar, multiecho pulse sequences of the brain and surrounding structures were obtained without and with intravenous contrast. CONTRAST:  7.92mL GADAVIST GADOBUTROL 1 MMOL/ML IV SOLN COMPARISON:  CT head without contrast 12/04/2020. MR head without and with contrast 11/26/2020 and 11/23/2020. FINDINGS: Brain: The surgical cavity has expanded. Peripheral enhancement is noted. The cavity measures 3.0 x 3.5 x 3.6 cm. Marked surrounding edema has progressed, as seen on the previous CT scan. This results in effacement of the sulci and partial effacement the right lateral ventricle. Midline shift is 3 mm right to left. Progressive dural enhancement is reactive over the right hemisphere. No distal enhancement is present. Cortical and subcortical edema is present along the medial left frontal lobe. 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 The brainstem and cerebellum are otherwise unremarkable. The internal auditory canals are within normal limits. Vascular: Flow is present in the major intracranial arteries. Skull and upper  cervical spine: The craniocervical junction is normal. Upper cervical spine is within normal limits. Marrow signal is unremarkable. Sinuses/Orbits: The paranasal sinuses and mastoid air cells are clear. The globes and orbits are within normal limits. IMPRESSION: 1. Progressive enlargement of a surgical cavity with peripheral enhancement. Given the rapid change in thick peripheral enhancement, this is most concerning for infection. 2. Stable blood products. 3. No evidence for significant tumor. 4. Progressive surrounding edema and mass effect with 3 mm right to left midline shift.  5. Stable cortical and subcortical edema in the medial left frontal lobe. 6. 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 These results were called by telephone at the time of interpretation on 12/04/2020 at 9:16 pm to provider [facility], who verbally acknowledged these results. Electronically Signed   By: San Morelle M.D.   On: 12/04/2020 21:17    Pending Labs Unresulted Labs (From admission, onward)    None       Vitals/Pain Today's Vitals   12/05/20 0600 12/05/20 0800 12/05/20 1000 12/05/20 1200  BP: (!) 129/95 (!) 144/91 (!) 161/80 140/77  Pulse: 95 93 100 95  Resp: 19 (!) 23 (!) 23 20  Temp:      TempSrc:      SpO2: 99% 100% 97% 98%  PainSc:        Isolation Precautions No active isolations  Medications Medications  irbesartan (AVAPRO) tablet 150 mg (150 mg Oral Given 12/05/20 1002)  levETIRAcetam (KEPPRA) tablet 500 mg (500 mg Oral Given 12/05/20 1016)  dexamethasone (DECADRON) injection 4 mg (4 mg Intravenous Given 12/05/20 1106)  LORazepam (ATIVAN) tablet 1 mg (1 mg Oral Given 12/04/20 1956)  gadobutrol (GADAVIST) 1 MMOL/ML injection 7.9 mL (7.9 mLs Intravenous Contrast Given 12/04/20 2045)    Mobility walks with person assist Low fall risk   Focused Assessments Neuro Assessment Handoff:  Swallow screen pass? Yes  Cardiac Rhythm: Normal sinus rhythm NIH Stroke Scale ( + Modified Stroke Scale Criteria)  LOC Questions (1b. )   +: Answers both questions correctly LOC Commands (1c. )   + : Performs both tasks correctly Best Gaze (2. )  +: Normal Visual (3. )  +: No visual loss Motor Arm, Left (5a. )   +: No drift Motor Arm, Right (5b. )   +: No drift Motor Leg, Left (6a. )   +: Drift Motor Leg, Right (6b. )   +: No drift Sensory (8. )   +: Normal, no sensory loss Best Language (9. )   +: No aphasia Extinction/Inattention (11.)   +: No Abnormality Modified SS Total  +: 1     Neuro Assessment: Exceptions to WDL (Pt not able to  move left arm; sensation intact in all extremeties) Neuro Checks:      Last Documented NIHSS Modified Score: 1 (12/04/20 1730) Has TPA been given? No If patient is a Neuro Trauma and patient is going to OR before floor call report to [place] nurse: [phone] or [phone]   R Recommendations: See Admitting Provider Note  Report given to:   Additional Notes: none

## 2020-12-05 NOTE — ED Notes (Signed)
Pt ambulated to and from the bathroom with a one person assist.

## 2020-12-05 NOTE — Progress Notes (Signed)
I reviewed the imaging and spoke with she and her husband at length.  Sounds like she had surgery on Wednesday and was discharged on Thursday and started to progress have progressive weakness in the left upper extremity starting on Saturday.  That temporal course is more consistent with postoperative swelling than it is with infection and cerebritis.  I have spoken with them at length about this.  Postoperative brain reaction, especially given her pathology, would be much more common than postoperative cerebritis and brain abscess.  Therefore I think we should try a IV steroids for short course rather than repeat "exploratory" brain surgery.  They are pleased with this plan.  Have spoken with Dr. Zada Finders

## 2020-12-05 NOTE — ED Notes (Signed)
Pt SpO2 dropped to 89% while sleeping. Pt placed on 2 L [place]

## 2020-12-05 NOTE — Progress Notes (Signed)
Erika Mitchell  Code Status: FULL Erika Mitchell is a 65 y.o. female patient admitted from ED awake, alert - oriented X4 - no acute distress noted. VSS -  no c/o shortness of breath, no c/o chest pain. Fall assessment complete, with patient able to verbalize understanding of risk associated with falls, and verbalized understanding to call nursing before up out of bed. Call light within reach, patient able to voice, and demonstrate understanding. Skin, clean-dry without evidence of bruising, or skin tears.  No evidence of skin break down noted on exam. Incision on head clean and intact. ?  Will cont to eval and treat per MD orders.  Melonie [place], RN  12/05/2020

## 2020-12-05 NOTE — Progress Notes (Signed)
PT Cancellation Note  Patient Details Name: Erika Mitchell MRN: [id number] DOB: 08/12/1955   Cancelled Treatment:    Reason Eval/Treat Not Completed: Fatigue/lethargy limiting ability to participate.  Pt has very weak LUE per nsg, may need platform vs hemiwalker to navigate.  Pt asked to wait to tomorrow for therapy.  Follow up as pt allows.   Ramond Dial 12/05/2020, 3:08 PM  Mee Hives, PT PhD Acute Rehab Dept. Number: [place] and [place]

## 2020-12-05 NOTE — ED Notes (Signed)
Pt ambulated to the bathroom with a one person assist, upon return placed pt on 2L of o2 per RN.

## 2020-12-05 NOTE — Progress Notes (Signed)
PT Cancellation Note  Patient Details Name: Erika Mitchell MRN: [id number] DOB: [birth year]   Cancelled Treatment:    Reason Eval/Treat Not Completed: Other (comment).  In transition to floor, will retry as time and pt allow.   Ramond Dial 12/05/2020, 2:49 PM  Mee Hives, PT PhD Acute Rehab Dept. Number: [place] and [place]

## 2020-12-05 NOTE — ED Notes (Signed)
Pt resting on stretcher with eyes closed, respirations even and unlabored. No acute changes noted. Will continue to monitor.

## 2020-12-06 MED ORDER — LOPERAMIDE HCL 2 MG PO CAPS
2.0000 mg | ORAL_CAPSULE | ORAL | Status: DC | PRN
  Administered 2020-12-06 – 2020-12-09 (×6): 2 mg via ORAL
  Filled 2020-12-06 (×6): qty 1

## 2020-12-06 MED ORDER — SENNOSIDES-DOCUSATE SODIUM 8.6-50 MG PO TABS: 1.0000 | ORAL_TABLET | Freq: Every evening | ORAL | Status: DC | PRN

## 2020-12-06 NOTE — Progress Notes (Signed)
No change in exam.  Plegic in the left arm.  Awake and alert.  Incision looks fine.  Uses her right side perfectly well.  Recommend continuing steroids for now.  Physical and Occupational Therapy.  Will likely need rehab.  [place] to resume care tomorrow

## 2020-12-06 NOTE — Progress Notes (Signed)
PROGRESS NOTE    Erika Mitchell  HQI:[id number] DOB: October 12, 1955 DOA: 12/04/2020 PCP: Ginger Organ., MD    Chief Complaint  Patient presents with   Extremity Weakness    Brief Narrative:  Erika Mitchell is a 65 y.o. 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) who presents to the emergency department due to 5-6-day onset of progressive weakness of left upper and lower extremities  Subjective:  Start to improve, able to life left arm  RN reports noticed patient drop her sats into the 80's when she sleeps, no hypoxia when she is awake, she denies chest pain, no sob Husband at bedside  Assessment & Plan:   Principal Problem:   Cerebral edema ([place]) Active Problems:   Essential hypertension   Seizures (H[facility])   S/P craniotomy  Left-sided weakness possibly secondary to cerebral edema, S/p craniotomy (11/25/2020) -MRI of head showed progressive surrounding edema and mass-effect with 3 mm right to left midline shift. -Continue IV Decadron 10 mg every 6 hours per neurosurgery recommendation -continue keppra  - management per neurosurgery  Essential hypertension Continue Micardis She is noted to have sinus tachycardia and hypertension while on micardia, add lopressor with holding parameters   Sleep apnea? Per RN "Pt SpO2 dropped to 89% while sleeping. Pt placed on 2 L [place] " May need home 02 at night    Unresulted Labs (From admission, onward)    None         DVT prophylaxis: SCDs Start: 12/05/20 0034   Code Status:full Family Communication: husband at bedside daily Disposition:   Status is: Inpatient   Dispo: The patient is from: home              Anticipated d/c is to: pending therapy eval              Anticipated d/c date is: TBD, needs neurosurgery clearance               Consultants:  neurosurgery  Procedures:  none  Antimicrobials:    Anti-infectives  (From admission, onward)    None          Objective: Vitals:   12/06/20 0036 12/06/20 0408 12/06/20 0803 12/06/20 1154  BP: (!) 118/59 125/75 115/70 116/61  Pulse: 67 72 70 62  Resp: 16 16 15 16   Temp: 98.2 F (36.8 C) (!) 97.4 F (36.3 C) 99.3 F (37.4 C) 97.9 F (36.6 C)  TempSrc: Axillary Axillary Axillary Oral  SpO2: 97% 97% 95% 95%    Intake/Output Summary (Last 24 hours) at 12/06/2020 1500 Last data filed at 12/05/2020 1700 Gross per 24 hour  Intake 240 ml  Output --  Net 240 ml   There were no vitals filed for this visit.  Examination:  General exam: alert, awake, communicative,calm, NAD Respiratory system: Clear to auscultation. Respiratory effort normal. Cardiovascular system:  RRR.  Gastrointestinal system: Abdomen is nondistended, soft and nontender.  Normal bowel sounds heard. Central nervous system: Alert and oriented. Now able to lift  Left arm against gravity  Extremities:  no edema Skin: No rashes, lesions or ulcers Psychiatry: Judgement and insight appear normal. Mood & affect appropriate.     Data Reviewed: I have personally reviewed following labs and imaging studies  CBC: Recent Labs  Lab 12/04/20 1532 12/05/20 0507  WBC 5.6 5.9  NEUTROABS 3.2  --   HGB 13.8 14.5  HCT 41.7 42.3  MCV 93.7 92.6  PLT 267 283    Basic Metabolic Panel: Recent Labs  Lab 12/04/20 1532 12/05/20 0507  NA 134* 138  K 3.5 3.8  CL 101 105  CO2 25 22  GLUCOSE 97 114*  BUN 8 9  CREATININE 0.75 0.67  CALCIUM 9.1 9.7  MG  --  2.0  PHOS  --  3.2    GFR: Estimated Creatinine Clearance: 78.1 mL/min (by C-G formula based on SCr of 0.67 mg/dL).  Liver Function Tests: Recent Labs  Lab 12/04/20 1532 12/05/20 0507  AST 16 15  ALT 21 20  ALKPHOS 55 58  BILITOT 0.6 1.0  PROT 6.4* 6.7  ALBUMIN 3.8 3.8    CBG: No results for input(s): GLUCAP in the last 168 hours.   Recent Results (from the past 240 hour(s))  Resp Panel by RT-PCR (Flu A&B,  Covid) Nasopharyngeal Swab     Status: None   Collection Time: 12/05/20 12:04 AM   Specimen: Nasopharyngeal Swab; Nasopharyngeal(NP) swabs in vial transport medium  Result Value Ref Range Status   SARS Coronavirus 2 by RT PCR NEGATIVE NEGATIVE Final    Comment: (NOTE) SARS-CoV-2 target nucleic acids are NOT DETECTED.  The SARS-CoV-2 RNA is generally detectable in upper respiratory specimens during the acute phase of infection. The lowest concentration of SARS-CoV-2 viral copies this assay can detect is 138 copies/mL. A negative result does not preclude SARS-Cov-2 infection and should not be used as the sole basis for treatment or other patient management decisions. A negative result may occur with  improper specimen collection/handling, submission of specimen other than nasopharyngeal swab, presence of viral mutation(s) within the areas targeted by this assay, and inadequate number of viral copies(<138 copies/mL). A negative result must be combined with clinical observations, patient history, and epidemiological information. The expected result is Negative.  Fact Sheet for Patients:  EntrepreneurPulse.com.au  Fact Sheet for Healthcare Providers:  IncredibleEmployment.be  This test is no t yet approved or cleared by the [place] FDA and  has been authorized for detection and/or diagnosis of SARS-CoV-2 by FDA under an Emergency Use Authorization (EUA). This EUA will remain  in effect (meaning this test can be used) for the duration of the COVID-19 declaration under Section 564(b)(1) of the Act, 21 U.S.C.section 360bbb-3(b)(1), unless the authorization is terminated  or revoked sooner.       Influenza A by PCR NEGATIVE NEGATIVE Final   Influenza B by PCR NEGATIVE NEGATIVE Final    Comment: (NOTE) The Xpert Xpress SARS-CoV-2/FLU/RSV plus assay is intended as an aid in the diagnosis of influenza from Nasopharyngeal swab specimens and should  not be used as a sole basis for treatment. Nasal washings and aspirates are unacceptable for Xpert Xpress SARS-CoV-2/FLU/RSV testing.  Fact Sheet for Patients: EntrepreneurPulse.com.au  Fact Sheet for Healthcare Providers: IncredibleEmployment.be  This test is not yet approved or cleared by the [place] FDA and has been authorized for detection and/or diagnosis of SARS-CoV-2 by FDA under an Emergency Use Authorization (EUA). This EUA will remain in effect (meaning this test can be used) for the duration of the COVID-19 declaration under Section 564(b)(1) of the Act, 21 U.S.C. section 360bbb-3(b)(1), unless the authorization is terminated or revoked.  Performed at [facility], [place] [address] [place], [place] [address]          Radiology Studies: CT HEAD WO CONTRAST (5MM)  Result Date: 12/04/2020 CLINICAL DATA:  Neuro deficit, acute, stroke suspected. Status post tumor removal. Left-sided weakness and facial droop.  EXAM: CT HEAD WITHOUT CONTRAST TECHNIQUE: Contiguous axial images were obtained from the base of the skull through the vertex without intravenous contrast. COMPARISON:  CT head without contrast 11/23/2020. MR head without and with contrast 11/26/2020. FINDINGS: Brain: Patient is status post right frontal craniotomy. Gas is present surgical cavity. Marked edema surrounds the operative site. The extent of surrounding vasogenic edema has increased. There is now mass effect with effacement of the sulci. No significant midline shift is present. Minimal blood products are again noted within the operative cavity. No significant new hemorrhage is present. The ventricles are of normal size. No significant extra-axial fluid collection is present. The brainstem and cerebellum are within normal limits. Vascular: No hyperdense vessel or unexpected calcification. Skull: Right frontal craniotomy.  Calvarium is otherwise intact. Sinuses/Orbits:  The paranasal sinuses and mastoid air cells are clear. The globes and orbits are within normal limits. IMPRESSION: 1. Status post right frontal craniotomy for tumor resection. 2. Marked edema surrounding the operative site has increased. 3. There is now mass effect with effacement of the sulci. 4. No significant midline shift. 5. Minimal blood products are again noted within the operative cavity. 6. No significant new hemorrhage. These results were called by telephone at the time of interpretation on 12/04/2020 at 6:04 pm to provider [facility] , who verbally acknowledged these results. Electronically Signed   By: San Morelle M.D.   On: 12/04/2020 18:04   MR BRAIN W WO CONTRAST  Result Date: 12/04/2020 CLINICAL DATA:  Progressive left upper and lower extremity weakness. Status post resection of brain tumor. EXAM: MRI HEAD WITHOUT AND WITH CONTRAST TECHNIQUE: Multiplanar, multiecho pulse sequences of the brain and surrounding structures were obtained without and with intravenous contrast. CONTRAST:  7.56mL GADAVIST GADOBUTROL 1 MMOL/ML IV SOLN COMPARISON:  CT head without contrast 12/04/2020. MR head without and with contrast 11/26/2020 and 11/23/2020. FINDINGS: Brain: The surgical cavity has expanded. Peripheral enhancement is noted. The cavity measures 3.0 x 3.5 x 3.6 cm. Marked surrounding edema has progressed, as seen on the previous CT scan. This results in effacement of the sulci and partial effacement the right lateral ventricle. Midline shift is 3 mm right to left. Progressive dural enhancement is reactive over the right hemisphere. No distal enhancement is present. Cortical and subcortical edema is present along the medial left frontal lobe. 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 The brainstem and cerebellum are otherwise unremarkable. The internal auditory canals are within normal limits. Vascular: Flow is present in the major intracranial arteries.  Skull and upper cervical spine: The craniocervical junction is normal. Upper cervical spine is within normal limits. Marrow signal is unremarkable. Sinuses/Orbits: The paranasal sinuses and mastoid air cells are clear. The globes and orbits are within normal limits. IMPRESSION: 1. Progressive enlargement of a surgical cavity with peripheral enhancement. Given the rapid change in thick peripheral enhancement, this is most concerning for infection. 2. Stable blood products. 3. No evidence for significant tumor. 4. Progressive surrounding edema and mass effect with 3 mm right to left midline shift. 5. Stable cortical and subcortical edema in the medial left frontal lobe. 6. 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 These results were called by telephone at the time of interpretation on 12/04/2020 at 9:16 pm to provider [facility], who verbally acknowledged these results. Electronically Signed   By: San Morelle M.D.   On: 12/04/2020 21:17        Scheduled Meds:  dexamethasone (DECADRON) injection  4 mg Intravenous Q6H   irbesartan  150 mg Oral Daily   levETIRAcetam  500 mg Oral BID   metoprolol tartrate  12.5 mg Oral BID   Continuous Infusions:   LOS: 2 days   Time spent: 41mins Greater than 50% of this time was spent in counseling, explanation of diagnosis, planning of further management, and coordination of care.   Voice Recognition Viviann Spare dictation system was used to create this note, attempts have been made to correct errors. Please contact the author with questions and/or clarifications.   Florencia Reasons, MD PhD FACP [facility]  Available via Epic secure chat 7am-7pm for nonurgent issues Please page for urgent issues To page the attending provider between 7A-7P or the covering provider during after hours 7P-7A, please log into the web site [email or web address] and access using universal [place] password for that web site. If you do not have the password,  please call the hospital operator.    12/06/2020, 3:00 PM

## 2020-12-06 NOTE — Evaluation (Signed)
Occupational Therapy Evaluation Patient Details Name: Erika Mitchell MRN: [id number] DOB: [birth year] Today's Date: 12/06/2020   History of Present Illness 65 y/o female who underwent recent R craniotomy for tumor resection on 10/19 presented to ED on 10/28 with 5-6 day onset of progressive weakness in L upper and lower extremities. MRI showed progressive surrounding edema and mass effect with 3 mm right to left midline shift. PMH: HTN, vertigo, vestibular neuritis   Clinical Impression   Pt admitted for concerns listed above. PTA pt reported that she was independent with all ADL's and IADL's, working for a dermatologist office in insurance. At this time, pt presents with decreased strength and coordination in her LUE and LLE. Pt requiring setup to mod A for ADL's and min guard for mobility. OT provided pt with exercises/ PROM activities to assist with her LUE. Recommend OP OT once discharged. OT will follow acutely to address concerns listed below.       Recommendations for follow up therapy are one component of a multi-disciplinary discharge planning process, led by the attending physician.  Recommendations may be updated based on patient status, additional functional criteria and insurance authorization.   Follow Up Recommendations  Outpatient OT    Assistance Recommended at Discharge Set up Supervision/Assistance  Functional Status Assessment  Patient has had a recent decline in their functional status and demonstrates the ability to make significant improvements in function in a reasonable and predictable amount of time.  Equipment Recommendations  None recommended by OT    Recommendations for Other Services       Precautions / Restrictions Precautions Precautions: Fall Restrictions Weight Bearing Restrictions: No      Mobility Bed Mobility Overal bed mobility: Modified Independent                  Transfers Overall transfer level: Needs assistance Equipment  used: None Transfers: Sit to/from Stand Sit to Stand: Supervision           General transfer comment: sit to stand from bed surface and toilet. supervision for safety      Balance Overall balance assessment: Mild deficits observed, not formally tested                                         ADL either performed or assessed with clinical judgement   ADL Overall ADL's : Needs assistance/impaired Eating/Feeding: Independent   Grooming: Min guard;Standing   Upper Body Bathing: Minimal assistance;Sitting   Lower Body Bathing: Moderate assistance;Sitting/lateral leans;Sit to/from stand   Upper Body Dressing : Min guard;Sitting   Lower Body Dressing: Minimal assistance;Moderate assistance;Sitting/lateral leans;Sit to/from stand   Toilet Transfer: Min guard;Ambulation   Toileting- Clothing Manipulation and Hygiene: Moderate assistance;Sitting/lateral lean;Sit to/from stand       Functional mobility during ADLs: Min guard General ADL Comments: Pt requiring increased assist for balance concerns and weakness in LUE.     Vision Baseline Vision/History: 1 Wears glasses Ability to See in Adequate Light: 0 Adequate Patient Visual Report: Blurring of vision;Eye fatigue/eye pain/headache Vision Assessment?: Vision impaired- to be further tested in functional context Additional Comments: Acuity is mildly affected, pt requiring larger, bold font.     Perception     Praxis      Pertinent Vitals/Pain Pain Assessment: No/denies pain     Hand Dominance Left   Extremity/Trunk Assessment Upper Extremity Assessment Upper Extremity Assessment: LUE deficits/detail  LUE Deficits / Details: Pt is able to flex to 70* and bicep contraction minimally noted when pt attempts to flex her elbow. Othewise pt is unable to actively move her LUE. LUE Sensation: WNL LUE Coordination: decreased fine motor   Lower Extremity Assessment Lower Extremity Assessment: Defer to PT  evaluation LLE Deficits / Details: mild weakness compared to R LE   Cervical / Trunk Assessment Cervical / Trunk Assessment: Normal   Communication Communication Communication: Expressive difficulties (word finding difficulty at times)   Cognition Arousal/Alertness: Awake/alert Behavior During Therapy: Flat affect Overall Cognitive Status: Impaired/Different from baseline Area of Impairment: Attention;Following commands;Safety/judgement;Awareness;Problem solving                   Current Attention Level: Selective   Following Commands: Follows one step commands consistently;Follows multi-step commands inconsistently Safety/Judgement: Decreased awareness of safety;Decreased awareness of deficits Awareness: Emergent Problem Solving: Slow processing;Difficulty sequencing General Comments: L inattention noted during functional tasks     General Comments  VSS on RA    Exercises     Shoulder Instructions      Home Living Family/patient expects to be discharged to:: Private residence Living Arrangements: Spouse/significant other Available Help at Discharge: Family;Available 24 hours/day Type of Home: House Home Access: Stairs to enter [employer] of Steps: 5 Entrance Stairs-Rails: Right;Left Home Layout: Able to live on main level with bedroom/bathroom;Multi-level;Two level     Bathroom Shower/Tub: Tub/shower unit;Walk-in shower   Bathroom Toilet: Handicapped height Bathroom Accessibility: Yes   Home Equipment: Conservation officer, nature (2 wheels);BSC;Shower seat          Prior Functioning/Environment Prior Level of Function : Independent/Modified Independent                        OT Problem List: Decreased strength;Decreased range of motion;Decreased activity tolerance;Impaired balance (sitting and/or standing);Decreased coordination;Decreased safety awareness;Impaired UE functional use;Impaired sensation;Impaired tone      OT  Treatment/Interventions: Self-care/ADL training;Therapeutic exercise;DME and/or AE instruction;Therapeutic activities;Neuromuscular education;Patient/family education;Balance training    OT Goals(Current goals can be found in the care plan section) Acute Rehab OT Goals Patient Stated Goal: To use her LUE functionally again OT Goal Formulation: With patient Time For Goal Achievement: 12/20/20 Potential to Achieve Goals: Good ADL Goals Pt Will Perform Grooming: with modified independence;standing Pt Will Perform Lower Body Bathing: with modified independence;sitting/lateral leans;sit to/from stand Pt Will Perform Lower Body Dressing: with modified independence;sitting/lateral leans;sit to/from stand Pt Will Transfer to Toilet: with modified independence;ambulating Pt Will Perform Toileting - Clothing Manipulation and hygiene: with modified independence;sitting/lateral leans;sit to/from stand Pt/caregiver will Perform Home Exercise Program: Increased ROM;Increased strength;Left upper extremity;With theraputty;Independently  OT Frequency: Min 2X/week   Barriers to D/C:            Co-evaluation              AM-PAC OT "6 Clicks" Daily Activity     Outcome Measure Help from another person eating meals?: None Help from another person taking care of personal grooming?: A Little Help from another person toileting, which includes using toliet, bedpan, or urinal?: A Little Help from another person bathing (including washing, rinsing, drying)?: A Lot Help from another person to put on and taking off regular upper body clothing?: A Little Help from another person to put on and taking off regular lower body clothing?: A Lot 6 Click Score: 17   End of Session Nurse Communication: Mobility status  Activity Tolerance: Patient tolerated treatment well Patient  left: in bed;with call bell/phone within reach  OT Visit Diagnosis: Muscle weakness (generalized) (M62.81);Other abnormalities of gait  and mobility (R26.89);Hemiplegia and hemiparesis Hemiplegia - Right/Left: Left Hemiplegia - dominant/non-dominant: Dominant Hemiplegia - caused by: Other cerebrovascular disease                Time: 9470-9628 OT Time Calculation (min): 18 min Charges:  OT General Charges $OT Visit: 1 Visit OT Evaluation $OT Eval Moderate Complexity: 1 Mod  Amunique Neyra H., OTR/L Acute Rehabilitation  Cellie Dardis Elane Judeen Geralds 12/06/2020, 5:21 PM

## 2020-12-06 NOTE — Evaluation (Signed)
Physical Therapy Evaluation Patient Details Name: Erika Mitchell MRN: [id number] DOB: 1955/12/12 Today's Date: 12/06/2020  History of Present Illness  65 y/o female who underwent recent R craniotomy for tumor resection on 10/19 presented to ED on 10/28 with 5-6 day onset of progressive weakness in L upper and lower extremities. MRI showed progressive surrounding edema and mass effect with 3 mm right to left midline shift. PMH: HTN, vertigo, vestibular neuritis  Clinical Impression  Patient admitted for above diagnosis. Patient presents with impaired balance, L sided weakness, decreased activity tolerance, and impaired cognition specifically regarding attention, sequencing, and processing. Patient requires supervision for ambulation with no AD at this time. Patient with L inattention during functional tasks (I.e. leaving water on with washing hands, running into objects on L). Patient will benefit from skilled PT services during acute stay to address listed deficits. Recommend neuro OPPT at discharge to address balance, strength, and cognitive deficits.        Recommendations for follow up therapy are one component of a multi-disciplinary discharge planning process, led by the attending physician.  Recommendations may be updated based on patient status, additional functional criteria and insurance authorization.  Follow Up Recommendations Outpatient PT (neuro)    Assistance Recommended at Discharge Intermittent Supervision/Assistance  Functional Status Assessment Patient has had a recent decline in their functional status and demonstrates the ability to make significant improvements in function in a reasonable and predictable amount of time.  Equipment Recommendations  None recommended by PT    Recommendations for Other Services       Precautions / Restrictions Precautions Precautions: Fall Restrictions Weight Bearing Restrictions: No      Mobility  Bed Mobility Overal bed mobility:  Modified Independent                  Transfers Overall transfer level: Needs assistance Equipment used: None Transfers: Sit to/from Stand Sit to Stand: Supervision           General transfer comment: sit to stand from bed surface and toilet. supervision for safety    Ambulation/Gait Ambulation/Gait assistance: Supervision Gait Distance (Feet): 250 Feet Assistive device: Hemi-Allyse Fregeau;None Gait Pattern/deviations: Step-through pattern;Decreased stride length Gait velocity: decreased   General Gait Details: initially ambulating with hemiwalker, however difficulty sequencing. Ambulated with no AD with supervision  Stairs Stairs: Yes Stairs assistance: Supervision Stair Management: One rail Right;Alternating pattern;Forwards Number of Stairs: 2    Wheelchair Mobility    Modified Rankin (Stroke Patients Only)       Balance Overall balance assessment: Mild deficits observed, not formally tested                                           Pertinent Vitals/Pain Pain Assessment: No/denies pain    Home Living Family/patient expects to be discharged to:: Private residence Living Arrangements: Spouse/significant other Available Help at Discharge: Family;Available 24 hours/day Type of Home: House Home Access: Stairs to enter Entrance Stairs-Rails: Psychiatric nurse of Steps: 5   Home Layout: Able to live on main level with bedroom/bathroom;Multi-level;Two level Home Equipment: Conservation officer, nature (2 wheels);BSC;Shower seat      Prior Function Prior Level of Function : Independent/Modified Independent                     Hand Dominance   Dominant Hand: Left    Extremity/Trunk Assessment   Upper Extremity  Assessment Upper Extremity Assessment: Defer to OT evaluation    Lower Extremity Assessment Lower Extremity Assessment: LLE deficits/detail LLE Deficits / Details: mild weakness compared to R LE    Cervical /  Trunk Assessment Cervical / Trunk Assessment: Normal  Communication   Communication: Expressive difficulties (word finding difficulty at times)  Cognition Arousal/Alertness: Awake/alert Behavior During Therapy: Flat affect Overall Cognitive Status: Impaired/Different from baseline Area of Impairment: Attention;Following commands;Safety/judgement;Awareness;Problem solving                   Current Attention Level: Selective   Following Commands: Follows one step commands consistently;Follows multi-step commands inconsistently Safety/Judgement: Decreased awareness of safety;Decreased awareness of deficits Awareness: Emergent Problem Solving: Slow processing;Difficulty sequencing General Comments: L inattention noted during functional tasks        General Comments      Exercises     Assessment/Plan    PT Assessment Patient needs continued PT services  PT Problem List Decreased strength;Decreased activity tolerance;Decreased balance;Decreased mobility;Decreased coordination;Decreased cognition       PT Treatment Interventions DME instruction;Gait training;Stair training;Functional mobility training;Therapeutic activities;Therapeutic exercise;Balance training;Patient/family education    PT Goals (Current goals can be found in the Care Plan section)  Acute Rehab PT Goals Patient Stated Goal: to go home PT Goal Formulation: With patient Time For Goal Achievement: 12/20/20 Potential to Achieve Goals: Good    Frequency Min 3X/week   Barriers to discharge        Co-evaluation               AM-PAC PT "6 Clicks" Mobility  Outcome Measure Help needed turning from your back to your side while in a flat bed without using bedrails?: None Help needed moving from lying on your back to sitting on the side of a flat bed without using bedrails?: None Help needed moving to and from a bed to a chair (including a wheelchair)?: A Little Help needed standing up from a chair  using your arms (e.g., wheelchair or bedside chair)?: A Little Help needed to walk in hospital room?: A Little Help needed climbing 3-5 steps with a railing? : A Little 6 Click Score: 20    End of Session Equipment Utilized During Treatment: Gait belt Activity Tolerance: Patient tolerated treatment well Patient left: in bed;with call bell/phone within reach;with bed alarm set Nurse Communication: Mobility status PT Visit Diagnosis: Unsteadiness on feet (R26.81);Muscle weakness (generalized) (M62.81);Other symptoms and signs involving the nervous system (E93.810)    Time: 1751-0258 PT Time Calculation (min) (ACUTE ONLY): 21 min   Charges:   PT Evaluation $PT Eval Moderate Complexity: 1 Mod          Erika Mitchell PT, DPT Acute Rehabilitation Services Pager [phone] Office [phone]   Linna Hoff 12/06/2020, 5:02 PM

## 2020-12-07 ENCOUNTER — Encounter (HOSPITAL_COMMUNITY): Payer: Self-pay

## 2020-12-07 ENCOUNTER — Inpatient Hospital Stay (HOSPITAL_COMMUNITY): Payer: BC Managed Care – PPO

## 2020-12-07 DIAGNOSIS — G936 Cerebral edema: Principal | ICD-10-CM

## 2020-12-07 DIAGNOSIS — R569 Unspecified convulsions: Secondary | ICD-10-CM

## 2020-12-07 LAB — SURGICAL PATHOLOGY

## 2020-12-07 MED ORDER — ENSURE ENLIVE PO LIQD
237.0000 mL | Freq: Two times a day (BID) | ORAL | Status: DC
  Administered 2020-12-07: 237 mL via ORAL

## 2020-12-07 MED ORDER — ADULT MULTIVITAMIN W/MINERALS CH
1.0000 | ORAL_TABLET | Freq: Every day | ORAL | Status: DC
  Administered 2020-12-07 – 2020-12-09 (×3): 1 via ORAL
  Filled 2020-12-07 (×3): qty 1

## 2020-12-07 NOTE — Progress Notes (Signed)
EEG completed, results pending. 

## 2020-12-07 NOTE — Plan of Care (Signed)

## 2020-12-07 NOTE — Progress Notes (Signed)
LTM EEG hooked up and running - no initial skin breakdown - push button tested - neuro notified. [facility] monitoring.  

## 2020-12-07 NOTE — Progress Notes (Addendum)
Initial Nutrition Assessment  DOCUMENTATION CODES:   Not applicable  INTERVENTION:  -Recommend liberalizing diet to optimize po intake -Ensure Enlive po BID, each supplement provides 350 kcal and 20 grams of protein -MVI with minerals daily  NUTRITION DIAGNOSIS:   Inadequate oral intake related to poor appetite as evidenced by meal completion < 25%.  GOAL:   Patient will meet greater than or equal to 90% of their needs  MONITOR:   PO intake, Supplement acceptance, Labs, Weight trends, I & O's  REASON FOR ASSESSMENT:   Malnutrition Screening Tool    ASSESSMENT:   Pt with PMH significant for HTN, vertigo, vestibular neuritis and s/p craniotomy for tumor resection (10/19) who presented to the emergency department with worsening left-sided weakness possibly 2/2 cerebral edema, s/p craniotomy (11/25/2020).  MRI head showed progressive surrounding edema and mass-effect with 3 mm R to L midline shift. Pathology from craniotomy pending. Pt to have EEG and repeat MRI.   Pt unavailable at time of RD visit x2 attempts.  Limited weight history available for review. Will attempt to obtain more information regarding diet/weight history at follow-up. Per RN, pt denies nausea but has had poor intake thus far. 20% meal completion x 1 recorded meal. Will provide pt with oral nutrition supplements to increase kcal/protein intake.   No UOP documented x24 hours.   Labs and medications reviewed. Pt receiving Decadron and Keppra.   NUTRITION - FOCUSED PHYSICAL EXAM: Unable to perform at this time. Will attempt at follow-up.   Diet Order:   Diet Order             Diet Heart Room service appropriate? Yes with Assist; Fluid consistency: Thin  Diet effective now                   EDUCATION NEEDS:   No education needs have been identified at this time  Skin:  Skin Assessment: Skin Integrity Issues: Skin Integrity Issues:: Incisions Incisions: closed head incision  Last BM:   10/30  Height:   Ht Readings from Last 1 Encounters:  11/23/20 5\' 7"  (1.702 m)    Weight:   Wt Readings from Last 1 Encounters:  11/23/20 79.4 kg    BMI:  There is no height or weight on file to calculate BMI.  Estimated Nutritional Needs:   Kcal:  1900-2100  Protein:  95-105 grams  Fluid:  >1.9L/d    Larkin Ina, MS, RD, LDN (she/her/hers) RD pager number and weekend/on-call pager number located in [place].

## 2020-12-07 NOTE — TOC Initial Note (Signed)
Transition of Care [facility]) - Initial/Assessment Note    Patient Details  Name: Erika Mitchell MRN: [id number] Date of Birth: 05/24/1955  Transition of Care [facility]) CM/SW Contact:    Pollie Friar, RN Phone Number: 12/07/2020, 12:00 PM  Clinical Narrative:                 Pt is from home with spouse. She states he is able to provide needed supervision as he is retired.  No current DME needs.  Pt denies issues with home medications or transportation.  Pt with recommendations for outpatient therapy. Information for [facility] on the AVS. Pt has transport home when medically ready.   Expected Discharge Plan: OP Rehab Barriers to Discharge: Continued Medical Work up   Patient Goals and CMS Choice     Choice offered to / list presented to : Patient  Expected Discharge Plan and Services Expected Discharge Plan: OP Rehab   Discharge Planning Services: CM Consult   Living arrangements for the past 2 months: Single Family Home                                      Prior Living Arrangements/Services Living arrangements for the past 2 months: Single Family Home Lives with:: Spouse Patient language and need for interpreter reviewed:: Yes Do you feel safe going back to the place where you live?: Yes      Need for Family Participation in Patient Care: Yes (Comment) Care giver support system in place?: Yes (comment)   Criminal Activity/Legal Involvement Pertinent to Current Situation/Hospitalization: No - Comment as needed  Activities of Daily Living Home Assistive Devices/Equipment: Eyeglasses ADL Screening (condition at time of admission) Patient's cognitive ability adequate to safely complete daily activities?: Yes Is the patient deaf or have difficulty hearing?: No Does the patient have difficulty seeing, even when wearing glasses/contacts?: No Does the patient have difficulty concentrating, remembering, or making decisions?: No Patient able to express need  for assistance with ADLs?: Yes Does the patient have difficulty dressing or bathing?: Yes Independently performs ADLs?: No Communication: Independent Dressing (OT): Needs assistance Is this a change from baseline?: Change from baseline, expected to last >3 days Grooming: Needs assistance Is this a change from baseline?: Change from baseline, expected to last >3 days Feeding: Needs assistance Is this a change from baseline?: Change from baseline, expected to last >3 days Bathing: Needs assistance Is this a change from baseline?: Change from baseline, expected to last >3 days Toileting: Needs assistance Is this a change from baseline?: Change from baseline, expected to last >3days In/Out Bed: Needs assistance Is this a change from baseline?: Change from baseline, expected to last >3 days Walks in Home: Needs assistance Is this a change from baseline?: Change from baseline, expected to last >3 days Does the patient have difficulty walking or climbing stairs?: Yes Weakness of Legs: Left Weakness of Arms/Hands: Left  Permission Sought/Granted                  Emotional Assessment Appearance:: Appears stated age Attitude/Demeanor/Rapport: Engaged Affect (typically observed): Accepting Orientation: : Oriented to Self, Oriented to Place, Oriented to  Time, Oriented to Situation   Psych Involvement: No (comment)  Admission diagnosis:  Cerebral edema ([place]) [G93.6] Weakness [R53.1] Patient Active Problem List   Diagnosis Date Noted   Essential hypertension 12/05/2020   Seizures ([place]) 12/05/2020   S/P craniotomy 12/05/2020  Cerebral edema ([place]) 12/04/2020   Brain tumor ([place]) 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 [place] [address] Phone: [phone] Fax: [phone]     Social Determinants of Health (SDOH) Interventions    Readmission Risk Interventions No flowsheet data found.

## 2020-12-07 NOTE — Progress Notes (Signed)
Neurosurgery Service Progress Note  Subjective: No acute events overnight, LUE improving, speech improving, still states she has some episodes where she stares into space, which is atypical for her   Objective: Vitals:   12/06/20 1934 12/07/20 0007 12/07/20 0403 12/07/20 0848  BP: (!) 142/71 112/68 131/73 117/66  Pulse: 69 (!) 57 66 73  Resp: 18 14 16 17   Temp: 97.9 F (36.6 C) 98.3 F (36.8 C) (!) 97.5 F (36.4 C) 97.8 F (36.6 C)  TempSrc: Oral Oral Oral Oral  SpO2: 96% 98% 96% 96%    Physical Exam: AOx3, PERRL, EOMI, +L UMN R FD, TM, Strength 5/5 on L, 4/5 in LLE, 4-/5 in LUE, SILTx4, incision c/d/i  Assessment & Plan: 65 y.o. woman s/p crani for tumor resection with rapid worsening starting a few days post-op and increased edema on MRI.   -discussed w/ the pt and her husband at length today, unclear diagnosis, will get a vEEG to make sure the staring episodes are not ictal, if negative and she continues to improve, likely discharge tomorrow if she feels up to it with a short-interval MRI to evaluate for developing abscess / improvement in edema -will update recs after EEG  Judith Part  12/07/20 10:23 AM

## 2020-12-07 NOTE — Progress Notes (Signed)
PROGRESS NOTE    Erika Mitchell  [name] DOB: January 13, 1956 DOA: 12/04/2020 PCP: Ginger Organ., MD    Chief Complaint  Patient presents with   Extremity Weakness    Brief Narrative:  Erika Mitchell is a 65 y.o. 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) who presented to the emergency department with worsening left-sided weakness  Subjective:  -Patient spouse reports some improvement in left-sided weakness  Assessment & Plan:  Left-sided weakness possibly secondary to cerebral edema, S/p craniotomy (11/25/2020) -MRI of head showed progressive surrounding edema and mass-effect with 3 mm right to left midline shift.  Pathology from craniotomy still pending -Clinical suspicion for infection is low -Clinically improving on IV Decadron -Continue Keppra -Plan for EEG, will need repeat MRI for follow-up soon  Essential hypertension Continue Micardis -Also started on low-dose beta-blocker this admission  DVT prophylaxis: SCDs Start: 12/05/20 0034   Code Status:full Family Communication: husband at bedside Disposition:  Home in 1 to 2 days pending clinical improvement               Consultants:  neurosurgery  Procedures:  none  Antimicrobials:    Anti-infectives (From admission, onward)    None      Objective: Vitals:   12/06/20 1934 12/07/20 0007 12/07/20 0403 12/07/20 0848  BP: (!) 142/71 112/68 131/73 117/66  Pulse: 69 (!) 57 66 73  Resp: 18 14 16 17   Temp: 97.9 F (36.6 C) 98.3 F (36.8 C) (!) 97.5 F (36.4 C) 97.8 F (36.6 C)  TempSrc: Oral Oral Oral Oral  SpO2: 96% 98% 96% 96%   No intake or output data in the 24 hours ending 12/07/20 1018  There were no vitals filed for this visit.  Examination:  General exam: Pleasant female sitting up in bed, AAOx3, no distress CVS: S1-S2, regular rate rhythm Lungs: Clear bilaterally Abdomen: Soft,  nontender, bowel sounds present Extremities: No edema Neuro, moderate left hemiplegia noted, 3+ strength left upper and lower extremity  Psychiatry: Mood & affect appropriate.     Data Reviewed: I have personally reviewed following labs and imaging studies  CBC: Recent Labs  Lab 12/04/20 1532 12/05/20 0507  WBC 5.6 5.9  NEUTROABS 3.2  --   HGB 13.8 14.5  HCT 41.7 42.3  MCV 93.7 92.6  PLT 267 811    Basic Metabolic Panel: Recent Labs  Lab 12/04/20 1532 12/05/20 0507  NA 134* 138  K 3.5 3.8  CL 101 105  CO2 25 22  GLUCOSE 97 114*  BUN 8 9  CREATININE 0.75 0.67  CALCIUM 9.1 9.7  MG  --  2.0  PHOS  --  3.2    GFR: Estimated Creatinine Clearance: 78.1 mL/min (by C-G formula based on SCr of 0.67 mg/dL).  Liver Function Tests: Recent Labs  Lab 12/04/20 1532 12/05/20 0507  AST 16 15  ALT 21 20  ALKPHOS 55 58  BILITOT 0.6 1.0  PROT 6.4* 6.7  ALBUMIN 3.8 3.8    CBG: No results for input(s): GLUCAP in the last 168 hours.   Recent Results (from the past 240 hour(s))  Resp Panel by RT-PCR (Flu A&B, Covid) Nasopharyngeal Swab     Status: None   Collection Time: 12/05/20 12:04 AM   Specimen: Nasopharyngeal Swab; Nasopharyngeal(NP) swabs in vial transport medium  Result Value Ref Range Status   SARS Coronavirus 2 by RT PCR NEGATIVE NEGATIVE Final    Comment: (  NOTE) SARS-CoV-2 target nucleic acids are NOT DETECTED.  The SARS-CoV-2 RNA is generally detectable in upper respiratory specimens during the acute phase of infection. The lowest concentration of SARS-CoV-2 viral copies this assay can detect is 138 copies/mL. A negative result does not preclude SARS-Cov-2 infection and should not be used as the sole basis for treatment or other patient management decisions. A negative result may occur with  improper specimen collection/handling, submission of specimen other than nasopharyngeal swab, presence of viral mutation(s) within the areas targeted by this assay,  and inadequate number of viral copies(<138 copies/mL). A negative result must be combined with clinical observations, patient history, and epidemiological information. The expected result is Negative.  Fact Sheet for Patients:  EntrepreneurPulse.com.au  Fact Sheet for Healthcare Providers:  IncredibleEmployment.be  This test is no t yet approved or cleared by the [place] FDA and  has been authorized for detection and/or diagnosis of SARS-CoV-2 by FDA under an Emergency Use Authorization (EUA). This EUA will remain  in effect (meaning this test can be used) for the duration of the COVID-19 declaration under Section 564(b)(1) of the Act, 21 U.S.C.section 360bbb-3(b)(1), unless the authorization is terminated  or revoked sooner.       Influenza A by PCR NEGATIVE NEGATIVE Final   Influenza B by PCR NEGATIVE NEGATIVE Final    Comment: (NOTE) The Xpert Xpress SARS-CoV-2/FLU/RSV plus assay is intended as an aid in the diagnosis of influenza from Nasopharyngeal swab specimens and should not be used as a sole basis for treatment. Nasal washings and aspirates are unacceptable for Xpert Xpress SARS-CoV-2/FLU/RSV testing.  Fact Sheet for Patients: EntrepreneurPulse.com.au  Fact Sheet for Healthcare Providers: IncredibleEmployment.be  This test is not yet approved or cleared by the [place] FDA and has been authorized for detection and/or diagnosis of SARS-CoV-2 by FDA under an Emergency Use Authorization (EUA). This EUA will remain in effect (meaning this test can be used) for the duration of the COVID-19 declaration under Section 564(b)(1) of the Act, 21 U.S.C. section 360bbb-3(b)(1), unless the authorization is terminated or revoked.  Performed at [facility], [place] [address] [place], [place] [address]     Scheduled Meds:  dexamethasone (DECADRON) injection  4 mg Intravenous Q6H    irbesartan  150 mg Oral Daily   levETIRAcetam  500 mg Oral BID   metoprolol tartrate  12.5 mg Oral BID   Continuous Infusions:   LOS: 3 days   Time spent: 55min   Voice Recognition /Dragon dictation system was used to create this note, attempts have been made to correct errors. Please contact the author with questions and/or clarifications.   Domenic Polite, MD  [facility]  12/07/2020, 10:18 AM

## 2020-12-07 NOTE — Progress Notes (Signed)
Physical Therapy Treatment Patient Details Name: Erika Mitchell MRN: [id number] DOB: 05-24-1955 Today's Date: 12/07/2020   History of Present Illness 65 y/o female who underwent recent R craniotomy for tumor resection on 10/19 presented to ED on 10/28 with 5-6 day onset of progressive weakness in L upper and lower extremities. MRI showed progressive surrounding edema and mass effect with 3 mm right to left midline shift. PMH: HTN, vertigo, vestibular neuritis    PT Comments    Pt slowly progressing. Pt continues to demo L sided neglect requiring constant verbal cues to manage obstacles on L while amb. Pt kept running into objects on L without self correction. Discussed having to constantly look L to acknowledge her L side. Pt continues to have inability to grip or demo wrist ext/flex with L hand. Pt continues to require assist for mobility in which spouse can provide as pt with increased falls risk. Acute PT to cont to follow.  Recommendations for follow up therapy are one component of a multi-disciplinary discharge planning process, led by the attending physician.  Recommendations may be updated based on patient status, additional functional criteria and insurance authorization.  Follow Up Recommendations  Outpatient PT (neuro)     Assistance Recommended at Discharge Intermittent Supervision/Assistance  Equipment Recommendations  None recommended by PT    Recommendations for Other Services       Precautions / Restrictions Precautions Precautions: Fall Restrictions Weight Bearing Restrictions: No     Mobility  Bed Mobility Overal bed mobility: Modified Independent Bed Mobility: Supine to Sit     Supine to sit: Supervision     General bed mobility comments: no physical assist    Transfers Overall transfer level: Needs assistance Equipment used: None Transfers: Sit to/from Stand Sit to Stand: Min guard           General transfer comment: sit to stand from edge of  bed, guarded and reports "i'm wobbly when I first get up"    Ambulation/Gait Ambulation/Gait assistance: Min guard Gait Distance (Feet): 200 Feet Assistive device: None Gait Pattern/deviations: Step-through pattern;Decreased stride length Gait velocity: decreased Gait velocity interpretation: <1.31 ft/sec, indicative of household ambulator General Gait Details: pt unable to grip with R hand, amb without added with short step height and length but not quite a shuffle, pt with narrow base of support   Stairs             Wheelchair Mobility    Modified Rankin (Stroke Patients Only)       Balance Overall balance assessment: Mild deficits observed, not formally tested                               Standardized Balance Assessment Standardized Balance Assessment : Dynamic Gait Index   Dynamic Gait Index Level Surface: Normal Change in Gait Speed: Normal Gait with Horizontal Head Turns: Mild Impairment Gait with Vertical Head Turns: Mild Impairment Gait and Pivot Turn: Mild Impairment Step Over Obstacle: Mild Impairment Step Around Obstacles: Mild Impairment Steps: Mild Impairment Total Score: 18      Cognition Arousal/Alertness: Awake/alert Behavior During Therapy: Flat affect Overall Cognitive Status: Impaired/Different from baseline Area of Impairment: Attention;Following commands;Safety/judgement;Awareness;Problem solving                   Current Attention Level: Selective   Following Commands: Follows one step commands consistently;Follows multi-step commands inconsistently Safety/Judgement: Decreased awareness of safety;Decreased awareness of deficits Awareness: Emergent Problem  Solving: Slow processing;Difficulty sequencing General Comments: L inattention noted during functional tasks        Exercises      General Comments General comments (skin integrity, edema, etc.): VSS on RA      Pertinent Vitals/Pain Pain Assessment:  No/denies pain    Home Living                          Prior Function            PT Goals (current goals can now be found in the care plan section) Acute Rehab PT Goals PT Goal Formulation: With patient Time For Goal Achievement: 12/20/20 Potential to Achieve Goals: Good Progress towards PT goals: Progressing toward goals    Frequency    Min 3X/week      PT Plan Current plan remains appropriate    Co-evaluation              AM-PAC PT "6 Clicks" Mobility   Outcome Measure  Help needed turning from your back to your side while in a flat bed without using bedrails?: None Help needed moving from lying on your back to sitting on the side of a flat bed without using bedrails?: None Help needed moving to and from a bed to a chair (including a wheelchair)?: A Little Help needed standing up from a chair using your arms (e.g., wheelchair or bedside chair)?: A Little Help needed to walk in hospital room?: A Little Help needed climbing 3-5 steps with a railing? : A Little 6 Click Score: 20    End of Session Equipment Utilized During Treatment: Gait belt Activity Tolerance: Patient tolerated treatment well Patient left: with call bell/phone within reach;in chair;with chair alarm set Nurse Communication: Mobility status PT Visit Diagnosis: Unsteadiness on feet (R26.81);Muscle weakness (generalized) (M62.81);Other symptoms and signs involving the nervous system (R29.898)     Time: 7588-3254 PT Time Calculation (min) (ACUTE ONLY): 17 min  Charges:  $Gait Training: 8-22 mins                    Kittie Plater, PT, DPT Acute Rehabilitation Services Pager #: [phone] Office #: [phone]    Berline Lopes 12/07/2020, 1:20 PM

## 2020-12-07 NOTE — Procedures (Signed)
Patient Name: Erika Mitchell  MRN: [id number]  Epilepsy Attending: Lora Havens  Referring Physician/Provider: Dr. Deatra Ina Date: 12/07/2020 Duration: 23.38 mins  Patient history: 65 y.o. woman s/p crani for tumor resection with rapid worsening starting a few days post-op and increased edema on MRI.  EEG to evaluate for seizure.  Level of alertness: Awake  AEDs during EEG study: LEV  Technical aspects: This EEG study was done with scalp electrodes positioned according to the 10-20 International system of electrode placement. Electrical activity was acquired at a sampling rate of 500Hz  and reviewed with a high frequency filter of 70Hz  and a low frequency filter of 1Hz . EEG data were recorded continuously and digitally stored.   Description: The posterior dominant rhythm consists of 9 Hz activity of moderate voltage (25-35 uV) seen predominantly in posterior head regions, asymmetric ( right<left) and reactive to eye opening and eye closing. EEG showed continuous 3 to 6 Hz theta-delta slowing in right hemisphere, maximal right frontal region likely due to breach artifact. Sharp waves were also seen in right frontal region. Hyperventilation and photic stimulation were not performed.     ABNORMALITY -Sharp wave, right frontal region.  - Continuous slow, right hemisphere, maximal right frontal region -Breach artifact, right frontal region.   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 There is also cortical dysfunction in right hemisphere, maximal right frontal region due to underlying structural lesion as well as prior craniotomy. No seizures were seen throughout the recording.  Noach Calvillo Barbra Sarks

## 2020-12-08 MED ORDER — ACETAMINOPHEN 325 MG PO TABS: 650.0000 mg | ORAL_TABLET | Freq: Four times a day (QID) | ORAL | Status: DC | PRN

## 2020-12-08 NOTE — Progress Notes (Addendum)
PROGRESS NOTE    Erika Mitchell  PIR:[id number] DOB: 1955-09-02 DOA: 12/04/2020 PCP: Ginger Organ., MD    Chief Complaint  Patient presents with   Extremity Weakness    Brief Narrative:  Erika Mitchell is a 65 y.o. 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) who presented to the emergency department with worsening left-sided weakness  Subjective:  -Reports that her left arm is getting stronger, left leg has improved considerably  Assessment & Plan:  Left-sided weakness likely secondary to cerebral edema, S/p craniotomy (11/25/2020) -MRI of head showed progressive surrounding edema and mass-effect with 3 mm right to left midline shift.  Pathology from craniotomy still pending -Clinical suspicion for infection is low -improving on IV Decadron -Continue Keppra -On continuous EEG per neurosurgery, which noted epileptogenicity from right frontal region, no seizures noted throughout recording -PT following  Essential hypertension Continue Micardis -Also started on low-dose beta-blocker this admission  Diarrhea after ensure -imodium today  DVT prophylaxis: SCDs Start: 12/05/20 0034   Code Status:full Family Communication: husband at bedside Disposition:  Home in 1 to 2 days pending clinical improvement               Consultants:  neurosurgery  Procedures: Continuous EEG   Antimicrobials:    Anti-infectives (From admission, onward)    None      Objective: Vitals:   12/08/20 0013 12/08/20 0100 12/08/20 0847 12/08/20 1226  BP: (!) 99/47 139/73 129/80 122/74  Pulse: (!) 58 68 64 62  Resp: 16 16 17 20   Temp: 98.2 F (36.8 C)  97.9 F (36.6 C) (!) 97.5 F (36.4 C)  TempSrc: Oral  Oral Oral  SpO2: 96%  96% 98%    Intake/Output Summary (Last 24 hours) at 12/08/2020 1243 Last data filed at 12/08/2020 0557 Gross per 24 hour  Intake 480 ml  Output --  Net 480 ml     There were no vitals filed for this visit.  Examination:  General exam: Pleasant female chronically ill-appearing, AAOx3, no distress CVS: S1-S2, regular rate rhythm Lungs: Clear bilaterally Abdomen: Soft, nontender, bowel sounds present Extremities: No edema  Neuro, moderate left hemiplegia noted, 3-4 strength left upper,  and lower extremity 4+ Psychiatry: Flat affect    Data Reviewed: I have personally reviewed following labs and imaging studies  CBC: Recent Labs  Lab 12/04/20 1532 12/05/20 0507  WBC 5.6 5.9  NEUTROABS 3.2  --   HGB 13.8 14.5  HCT 41.7 42.3  MCV 93.7 92.6  PLT 267 630    Basic Metabolic Panel: Recent Labs  Lab 12/04/20 1532 12/05/20 0507  NA 134* 138  K 3.5 3.8  CL 101 105  CO2 25 22  GLUCOSE 97 114*  BUN 8 9  CREATININE 0.75 0.67  CALCIUM 9.1 9.7  MG  --  2.0  PHOS  --  3.2    GFR: CrCl cannot be calculated (Unknown ideal weight.).  Liver Function Tests: Recent Labs  Lab 12/04/20 1532 12/05/20 0507  AST 16 15  ALT 21 20  ALKPHOS 55 58  BILITOT 0.6 1.0  PROT 6.4* 6.7  ALBUMIN 3.8 3.8    CBG: No results for input(s): GLUCAP in the last 168 hours.   Recent Results (from the past 240 hour(s))  Resp Panel by RT-PCR (Flu A&B, Covid) Nasopharyngeal Swab     Status: None   Collection Time: 12/05/20 12:04 AM   Specimen: Nasopharyngeal Swab;  Nasopharyngeal(NP) swabs in vial transport medium  Result Value Ref Range Status   SARS Coronavirus 2 by RT PCR NEGATIVE NEGATIVE Final    Comment: (NOTE) SARS-CoV-2 target nucleic acids are NOT DETECTED.  The SARS-CoV-2 RNA is generally detectable in upper respiratory specimens during the acute phase of infection. The lowest concentration of SARS-CoV-2 viral copies this assay can detect is 138 copies/mL. A negative result does not preclude SARS-Cov-2 infection and should not be used as the sole basis for treatment or other patient management decisions. A negative result may occur  with  improper specimen collection/handling, submission of specimen other than nasopharyngeal swab, presence of viral mutation(s) within the areas targeted by this assay, and inadequate number of viral copies(<138 copies/mL). A negative result must be combined with clinical observations, patient history, and epidemiological information. The expected result is Negative.  Fact Sheet for Patients:  EntrepreneurPulse.com.au  Fact Sheet for Healthcare Providers:  IncredibleEmployment.be  This test is no t yet approved or cleared by the [place] FDA and  has been authorized for detection and/or diagnosis of SARS-CoV-2 by FDA under an Emergency Use Authorization (EUA). This EUA will remain  in effect (meaning this test can be used) for the duration of the COVID-19 declaration under Section 564(b)(1) of the Act, 21 U.S.C.section 360bbb-3(b)(1), unless the authorization is terminated  or revoked sooner.       Influenza A by PCR NEGATIVE NEGATIVE Final   Influenza B by PCR NEGATIVE NEGATIVE Final    Comment: (NOTE) The Xpert Xpress SARS-CoV-2/FLU/RSV plus assay is intended as an aid in the diagnosis of influenza from Nasopharyngeal swab specimens and should not be used as a sole basis for treatment. Nasal washings and aspirates are unacceptable for Xpert Xpress SARS-CoV-2/FLU/RSV testing.  Fact Sheet for Patients: EntrepreneurPulse.com.au  Fact Sheet for Healthcare Providers: IncredibleEmployment.be  This test is not yet approved or cleared by the [place] FDA and has been authorized for detection and/or diagnosis of SARS-CoV-2 by FDA under an Emergency Use Authorization (EUA). This EUA will remain in effect (meaning this test can be used) for the duration of the COVID-19 declaration under Section 564(b)(1) of the Act, 21 U.S.C. section 360bbb-3(b)(1), unless the authorization is terminated  or revoked.  Performed at [facility], [place] [address], [place], [place] [address]     Scheduled Meds:  dexamethasone (DECADRON) injection  4 mg Intravenous Q6H   irbesartan  150 mg Oral Daily   levETIRAcetam  500 mg Oral BID   metoprolol tartrate  12.5 mg Oral BID   multivitamin with minerals  1 tablet Oral Daily   Continuous Infusions:   LOS: 4 days   Time spent: 26min   Voice Recognition /Dragon dictation system was used to create this note, attempts have been made to correct errors. Please contact the author with questions and/or clarifications.   Domenic Polite, MD  [facility]  12/08/2020, 12:43 PM

## 2020-12-08 NOTE — Progress Notes (Signed)
PT Cancellation Note  Patient Details Name: Erika Mitchell MRN: [id number] DOB: [birth year]   Cancelled Treatment:    Reason Eval/Treat Not Completed: Patient at procedure or test/unavailable. Pt hooked up to continuous EEG. Pt only able to walk to/from bathroom. PT to return tomorrow as able when pt can walk in hallway and higher level balance can be addressed.   Kittie Plater, PT, DPT Acute Rehabilitation Services Pager #: [phone] Office #: [phone]    Berline Lopes 12/08/2020, 12:25 PM

## 2020-12-08 NOTE — Progress Notes (Signed)
Neurosurgery Service Progress Note  Subjective: No acute events overnight, LUE continues to improve subjectively  Objective: Vitals:   12/08/20 0847 12/08/20 1226 12/08/20 1658 12/08/20 1950  BP: 129/80 122/74 132/83 140/71  Pulse: 64 62 62 (!) 59  Resp: 17 20 20 17   Temp: 97.9 F (36.6 C) (!) 97.5 F (36.4 C) 97.6 F (36.4 C) 97.7 F (36.5 C)  TempSrc: Oral Oral Axillary Oral  SpO2: 96% 98% 96% 96%    Physical Exam: AOx3, PERRL, EOMI, +L UMN R FD, TM, Strength 5/5 on L, 4+/5 in LLE, 4/5 in LUE, SILTx4, incision c/d/i  Assessment & Plan: 65 y.o. woman s/p crani for tumor resection with rapid worsening starting a few days post-op and increased edema on MRI.   -EEG neg and clinically improving, okay with discharging her home tomorrow, will update with a dex taper, will need to follow up with me in 2 weeks with repeat MRI  Judith Part  12/08/20 9:03 PM

## 2020-12-08 NOTE — Progress Notes (Signed)
LTM EEG discontinued - no skin breakdown at unhook.   

## 2020-12-08 NOTE — Plan of Care (Signed)

## 2020-12-08 NOTE — Progress Notes (Signed)
Occupational Therapy Treatment Patient Details Name: Erika Mitchell MRN: [id number] DOB: 1955-10-29 Today's Date: 12/08/2020   History of present illness 65 y/o female who underwent recent R craniotomy for tumor resection on 10/19 presented to ED on 10/28 with 5-6 day onset of progressive weakness in L upper and lower extremities. MRI showed progressive surrounding edema and mass effect with 3 mm right to left midline shift. PMH: HTN, vertigo, vestibular neuritis   OT comments  Pt making steady progress towards OT goals this session. Session focus on BADL reeducation and LUE therex to increase strength and coordination for higher level ADLs. Pt continues to present with impaired balance, impaired coordination/strength in LUE and decreased ability to care self independently. Pt currently requires min guard assist for household distance functional mobility with no AD and min guard for dynamic grooming tasks at sink. Pt completed therex as indicated below with HEP provided. Pt requires self ROM for all digit movements including opposition and flexion/extension. Education provided to pt and family on importance of integrating LUE into all ADLs. Pt would continue to benefit from skilled occupational therapy while admitted and after d/c to address the below listed limitations in order to improve overall functional mobility and facilitate independence with BADL participation. DC plan remains appropriate, will follow acutely per POC.       Recommendations for follow up therapy are one component of a multi-disciplinary discharge planning process, led by the attending physician.  Recommendations may be updated based on patient status, additional functional criteria and insurance authorization.    Follow Up Recommendations  Outpatient OT    Assistance Recommended at Discharge Set up Supervision/Assistance  Equipment Recommendations  None recommended by OT    Recommendations for Other Services       Precautions / Restrictions Precautions Precautions: Fall Precaution Comments: on EEG 11/1 Restrictions Weight Bearing Restrictions: No       Mobility Bed Mobility Overal bed mobility: Modified Independent Bed Mobility: Supine to Sit;Sit to Supine     Supine to sit: Modified independent (Device/Increase time) Sit to supine: Modified independent (Device/Increase time)   General bed mobility comments: use of bed rail and elevated HOB    Transfers Overall transfer level: Needs assistance Equipment used: None Transfers: Sit to/from Stand Sit to Stand: Min guard           General transfer comment: min guard to rise from EOB for safety     Balance Overall balance assessment: Needs assistance Sitting-balance support: No upper extremity supported;Feet supported Sitting balance-Leahy Scale: Fair     Standing balance support: During functional activity;Single extremity supported Standing balance-Leahy Scale: Poor Standing balance comment: at least one UE supported during dynamic tasks at sink                           ADL either performed or assessed with clinical judgement   ADL Overall ADL's : Needs assistance/impaired     Grooming: Wash/dry hands;Standing;Min guard       Lower Body Bathing: Supervison/ safety;Sitting/lateral leans Lower Body Bathing Details (indicate cue type and reason): simualated via anterior pericare         Toilet Transfer: Min guard;Ambulation;Grab Information systems manager Details (indicate cue type and reason): min guard for safety no AD Toileting- Clothing Manipulation and Hygiene: Supervision/safety;Sitting/lateral lean       Functional mobility during ADLs: Min guard General ADL Comments: pt continues to present with impaired balance, L in[place]enton, and impaired LUE  strength and coordination     Vision Baseline Vision/History: 1 Wears glasses Ability to See in Adequate Light: 0 Adequate Patient Visual  Report: Blurring of vision;Eye fatigue/eye pain/headache Vision Assessment?: Vision impaired- to be further tested in functional context Additional Comments: pt continues to endorse blurry vision and not being able to look at her phone for long periods of time, noted mild L inattention when mobilizing and when completing therex   Perception Perception Perception: Impaired Inattention/Neglect: Does not attend to left visual field;Does not attend to left side of body;Other (comment) (cues needed to locate trash can on L side and to use L hand during therex)   Praxis Praxis Praxis: Impaired Praxis Impairment Details: Motor planning Praxis-Other Comments: impaired motor planning during ADLs, slow, ataxic coordination in LUE    Cognition Arousal/Alertness: Awake/alert Behavior During Therapy: Flat affect Overall Cognitive Status: Impaired/Different from baseline Area of Impairment: Attention;Following commands;Safety/judgement;Awareness;Problem solving                   Current Attention Level: Selective   Following Commands: Follows one step commands consistently;Follows multi-step commands with increased time Safety/Judgement: Decreased awareness of safety;Decreased awareness of deficits Awareness: Emergent Problem Solving: Slow processing;Difficulty sequencing;Requires verbal cues;Requires tactile cues (during therex) General Comments: L inattention noted during functional tasks, tactile and verbal cues needed for therex          Exercises General Exercises - Upper Extremity Shoulder Flexion: AROM;Left;5 reps;Seated Elbow Flexion: AROM;5 reps;Left;Seated Elbow Extension: AROM;Left;5 reps;Seated Wrist Flexion: AROM;Left;5 reps;Seated Wrist Extension: AROM;Left;5 reps;Seated Digit Composite Flexion: AAROM;Left;5 reps;Seated;Other (comment) (theraputty) Composite Extension: AAROM;Left;5 reps;Seated;Other (comment) (theraputty) Hand Exercises Forearm Supination: AROM;Left;5  reps;Seated Forearm Pronation: AROM;Left;5 reps;Seated Digit Lifts: Self ROM;Left;5 reps;Seated Opposition: Self ROM;Left;5 reps;Seated Other Exercises Other Exercises: issued pt level 1 theraputty to assist with functional grasp on L hand, additionally issued pt built up handle to assist with self feeding   Shoulder Instructions       General Comments hsuband present during session, theraputty and HEP provided    Pertinent Vitals/ Pain       Pain Assessment: No/denies pain  Home Living                                          Prior Functioning/Environment              Frequency  Min 2X/week        Progress Toward Goals  OT Goals(current goals can now be found in the care plan section)  Progress towards OT goals: Progressing toward goals  Acute Rehab OT Goals Patient Stated Goal: to get LUE moving OT Goal Formulation: With patient/family Time For Goal Achievement: 12/20/20 Potential to Achieve Goals: Good  Plan Discharge plan remains appropriate;Frequency remains appropriate    Co-evaluation                 AM-PAC OT "6 Clicks" Daily Activity     Outcome Measure   Help from another person eating meals?: A Little Help from another person taking care of personal grooming?: A Little Help from another person toileting, which includes using toliet, bedpan, or urinal?: A Little Help from another person bathing (including washing, rinsing, drying)?: A Lot Help from another person to put on and taking off regular upper body clothing?: A Little Help from another person to put on and taking off regular lower body clothing?: A Lot  6 Click Score: 16    End of Session    OT Visit Diagnosis: Muscle weakness (generalized) (M62.81);Other abnormalities of gait and mobility (R26.89);Hemiplegia and hemiparesis Hemiplegia - Right/Left: Left Hemiplegia - dominant/non-dominant: Dominant Hemiplegia - caused by: Other cerebrovascular disease    Activity Tolerance Patient tolerated treatment well   Patient Left in bed;with call bell/phone within reach   Nurse Communication Mobility status        Time: 2671-2458 OT Time Calculation (min): 31 min  Charges: OT General Charges $OT Visit: 1 Visit OT Treatments $Self Care/Home Management : 8-22 mins $Therapeutic Exercise: 8-22 mins  Harley Alto., COTA/L Acute Rehabilitation Services [phone]    Precious Haws 12/08/2020, 8:56 AM

## 2020-12-08 NOTE — Procedures (Addendum)
Patient Name: Erika Mitchell  MRN: [id number]  Epilepsy Attending: Lora Havens  Referring Physician/Provider: Dr. Deatra Ina Duration: 12/07/2020 1607 to 12/08/2020 1414   Patient history: 65 y.o. woman s/p crani for tumor resection with rapid worsening starting a few days post-op and increased edema on MRI.  EEG to evaluate for seizure.   Level of alertness: Awake, asleep   AEDs during EEG study: LEV   Technical aspects: This EEG study was done with scalp electrodes positioned according to the 10-20 International system of electrode placement. Electrical activity was acquired at a sampling rate of 500Hz  and reviewed with a high frequency filter of 70Hz  and a low frequency filter of 1Hz . EEG data were recorded continuously and digitally stored.    Description: The posterior dominant rhythm consists of 9 Hz activity of moderate voltage (25-35 uV) seen predominantly in posterior head regions, asymmetric ( right<left) and reactive to eye opening and eye closing. Sleep is characterized by vertex waves, sleep spindles (12 to 14 Hz), maximal frontocentral region. EEG showed continuous 3 to 6 Hz theta-delta slowing in right hemisphere, maximal right frontal region likely due to breach artifact. Sharp waves were also seen in right frontal region. Hyperventilation and photic stimulation were not performed.      ABNORMALITY - Sharp wave, right frontal region.  - Continuous slow, right hemisphere, maximal right frontal region - Breach artifact, right frontal region.    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 There is also cortical dysfunction in right hemisphere, maximal right frontal region due to underlying structural lesion as well as prior craniotomy. No seizures were seen throughout the recording.   Zaivion Kundrat Barbra Sarks

## 2020-12-09 ENCOUNTER — Encounter (HOSPITAL_COMMUNITY): Payer: Self-pay | Admitting: Internal Medicine

## 2020-12-09 MED ORDER — METOPROLOL TARTRATE 25 MG PO TABS: 12.5000 mg | ORAL_TABLET | Freq: Two times a day (BID) | ORAL | 0 refills | Status: DC

## 2020-12-09 MED ORDER — ALPRAZOLAM 0.25 MG PO TABS: 1.1250 mg | ORAL_TABLET | Freq: Every day | ORAL | Status: AC | PRN

## 2020-12-09 MED ORDER — DIAZEPAM 5 MG PO TABS: 5.0000 mg | ORAL_TABLET | Freq: Once | ORAL | 0 refills | Status: AC

## 2020-12-09 MED ORDER — DEXAMETHASONE 4 MG PO TABS: 4.0000 mg | ORAL_TABLET | Freq: Two times a day (BID) | ORAL | 2 refills | Status: DC

## 2020-12-09 NOTE — Progress Notes (Signed)
Nursing dc note  Alert and oriented. Both patient and husband verbalized understanding of instructions. Belongings given to patient. Volunteer to take patient to the main entrance.

## 2020-12-09 NOTE — Progress Notes (Signed)
Neurosurgery Service Progress Note  Subjective: No acute events overnight  Objective: Vitals:   12/08/20 1950 12/08/20 2356 12/09/20 0413 12/09/20 0816  BP: 140/71 138/78 130/80 138/74  Pulse: (!) 59 (!) 55 (!) 58 (!) 58  Resp: 17 18 16 20   Temp: 97.7 F (36.5 C) 97.8 F (36.6 C) 98.2 F (36.8 C) (!) 97.3 F (36.3 C)  TempSrc: Oral Oral Oral Oral  SpO2: 96% 97% 96% 98%    Physical Exam: AOx3, PERRL, EOMI, +L UMN R FD, TM, Strength 5/5 on L, 4+/5 in LLE, 4/5 in LUE, SILTx4, incision c/d/i  Assessment & Plan: 65 y.o. woman s/p crani for tumor resection with rapid worsening starting a few days post-op and increased edema on MRI.   -will have my office arrange a 2 week f/u MRI and office visit -can discharge on dex 4bid po, will wean down as an outpatient -will place order in her discharge med rec for some diazepam for sedation for her MRI -outpt PT/OT -they asked about path today, it's back so I went over it with them, will d/w Dr. Mickeal Skinner about setting up a visit to go over things in better detail  Judith Part  12/09/20 9:08 AM

## 2020-12-09 NOTE — TOC Transition Note (Signed)
Transition of Care [facility]) - CM/SW Discharge Note   Patient Details  Name: YENIFER SACCENTE MRN: [id number] Date of Birth: Jul 26, 1955  Transition of [facility]) CM/SW Contact:  Pollie Friar, RN Phone Number: 12/09/2020, 10:29 AM   Clinical Narrative:    Patient discharging home with outpatient therapy through [facility]. Information on the AVS.  Pt has supervision at home and transportation to home.    Final next level of care: OP Rehab Barriers to Discharge: No Barriers Identified   Patient Goals and CMS Choice     Choice offered to / list presented to : Patient  Discharge Placement                       Discharge Plan and Services   Discharge Planning Services: CM Consult                                 Social Determinants of Health (SDOH) Interventions     Readmission Risk Interventions No flowsheet data found.

## 2020-12-09 NOTE — Discharge Summary (Addendum)
Physician Discharge Summary  Erika Mitchell QBH:[id number] DOB: Jul 07, 1955 DOA: 12/04/2020  PCP: Ginger Organ., MD  Admit date: 12/04/2020 Discharge date: 12/09/2020  Discharge disposition: Home   Recommendations for Outpatient Follow-Up:   Follow-up with Dr. Zada Finders, neurosurgeon, in 2 weeks. Follow-up with PCP in 1 to 2 weeks.   Discharge Diagnosis:   Principal Problem:   Cerebral edema ([place]) Active Problems:   Essential hypertension   Seizures (H[facility])   S/P craniotomy    Discharge Condition: Stable.  Diet recommendation:  Diet Order             Diet - low sodium heart healthy           Diet Heart Room service appropriate? Yes with Assist; Fluid consistency: Thin  Diet effective now                     Code Status: Full Code     Hospital Course:   Erika Mitchell is a 65 year old woman with medical history significant for hypertension, vertigo, vestibular neuritis, s/p craniotomy for brain tumor on 11/25/2020, who presented to the hospital because of worsening left-sided weakness.  Left-sided weakness was attributed to cerebral edema s/p craniotomy.  She was treated with IV Decadron.  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  Left-sided weakness slowly improved and and from a neurosurgical standpoint, she is deemed stable for discharge to home today.  Discharge plan was discussed with the patient and her husband at the bedside.   Medical Consultants:   Neurologist Neurosurgeon   Discharge Exam:    Vitals:   12/08/20 1950 12/08/20 2356 12/09/20 0413 12/09/20 0816  BP: 140/71 138/78 130/80 138/74  Pulse: (!) 59 (!) 55 (!) 58 (!) 58  Resp: $Remo'17 18 16 20  'wzClS$ Temp: 97.7 F (36.5 C) 97.8 F (36.6 C) 98.2 F (36.8 C) (!) 97.3 F (36.3 C)  TempSrc: Oral Oral Oral Oral  SpO2: 96% 97% 96% 98%     GEN: NAD SKIN: No rash on exposed skin EYES: No pallor or icterus ENT: MMM CV: RRR PULM:  CTA B ABD: soft, ND, NT, +BS CNS: AAO x 3, mild left-sided hemiparesis (power is 4/5 in LUE and 4+/5 in LLE) EXT: No edema or tenderness   The results of significant diagnostics from this hospitalization (including imaging, microbiology, ancillary and laboratory) are listed below for reference.     Procedures and Diagnostic Studies:   CT HEAD WO CONTRAST (5MM)  Result Date: 12/04/2020 CLINICAL DATA:  Neuro deficit, acute, stroke suspected. Status post tumor removal. Left-sided weakness and facial droop. EXAM: CT HEAD WITHOUT CONTRAST TECHNIQUE: Contiguous axial images were obtained from the base of the skull through the vertex without intravenous contrast. COMPARISON:  CT head without contrast 11/23/2020. MR head without and with contrast 11/26/2020. FINDINGS: Brain: Patient is status post right frontal craniotomy. Gas is present surgical cavity. Marked edema surrounds the operative site. The extent of surrounding vasogenic edema has increased. There is now mass effect with effacement of the sulci. No significant midline shift is present. Minimal blood products are again noted within the operative cavity. No significant new hemorrhage is present. The ventricles are of normal size. No significant extra-axial fluid collection is present. The brainstem and cerebellum are within normal limits. Vascular: No hyperdense vessel or unexpected calcification. Skull: Right frontal craniotomy.  Calvarium is otherwise intact. Sinuses/Orbits: The paranasal sinuses and mastoid air cells are clear. The globes and  orbits are within normal limits. IMPRESSION: 1. Status post right frontal craniotomy for tumor resection. 2. Marked edema surrounding the operative site has increased. 3. There is now mass effect with effacement of the sulci. 4. No significant midline shift. 5. Minimal blood products are again noted within the operative cavity. 6. No significant new hemorrhage. These results were called by telephone at the  time of interpretation on 12/04/2020 at 6:04 pm to provider [facility] , who verbally acknowledged these results. Electronically Signed   By: Marin Roberts M.D.   On: 12/04/2020 18:04   MR BRAIN W WO CONTRAST  Result Date: 12/04/2020 CLINICAL DATA:  Progressive left upper and lower extremity weakness. Status post resection of brain tumor. EXAM: MRI HEAD WITHOUT AND WITH CONTRAST TECHNIQUE: Multiplanar, multiecho pulse sequences of the brain and surrounding structures were obtained without and with intravenous contrast. CONTRAST:  7.12mL GADAVIST GADOBUTROL 1 MMOL/ML IV SOLN COMPARISON:  CT head without contrast 12/04/2020. MR head without and with contrast 11/26/2020 and 11/23/2020. FINDINGS: Brain: The surgical cavity has expanded. Peripheral enhancement is noted. The cavity measures 3.0 x 3.5 x 3.6 cm. Marked surrounding edema has progressed, as seen on the previous CT scan. This results in effacement of the sulci and partial effacement the right lateral ventricle. Midline shift is 3 mm right to left. Progressive dural enhancement is reactive over the right hemisphere. No distal enhancement is present. Cortical and subcortical edema is present along the medial left frontal lobe. 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 The brainstem and cerebellum are otherwise unremarkable. The internal auditory canals are within normal limits. Vascular: Flow is present in the major intracranial arteries. Skull and upper cervical spine: The craniocervical junction is normal. Upper cervical spine is within normal limits. Marrow signal is unremarkable. Sinuses/Orbits: The paranasal sinuses and mastoid air cells are clear. The globes and orbits are within normal limits. IMPRESSION: 1. Progressive enlargement of a surgical cavity with peripheral enhancement. Given the rapid change in thick peripheral enhancement, this is most concerning for infection. 2. Stable blood products. 3. No  evidence for significant tumor. 4. Progressive surrounding edema and mass effect with 3 mm right to left midline shift. 5. Stable cortical and subcortical edema in the medial left frontal lobe. 6. 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 These results were called by telephone at the time of interpretation on 12/04/2020 at 9:16 pm to provider [facility], who verbally acknowledged these results. Electronically Signed   By: Marin Roberts M.D.   On: 12/04/2020 21:17     Labs:   Basic Metabolic Panel: Recent Labs  Lab 12/04/20 1532 12/05/20 0507  NA 134* 138  K 3.5 3.8  CL 101 105  CO2 25 22  GLUCOSE 97 114*  BUN 8 9  CREATININE 0.75 0.67  CALCIUM 9.1 9.7  MG  --  2.0  PHOS  --  3.2   GFR CrCl cannot be calculated (Unknown ideal weight.). Liver Function Tests: Recent Labs  Lab 12/04/20 1532 12/05/20 0507  AST 16 15  ALT 21 20  ALKPHOS 55 58  BILITOT 0.6 1.0  PROT 6.4* 6.7  ALBUMIN 3.8 3.8   No results for input(s): LIPASE, AMYLASE in the last 168 hours. No results for input(s): AMMONIA in the last 168 hours. Coagulation profile Recent Labs  Lab 12/04/20 1532 12/05/20 0507  INR 1.0 1.0    CBC: Recent Labs  Lab 12/04/20 1532 12/05/20 0507  WBC 5.6 5.9  NEUTROABS 3.2  --   HGB 13.8 14.5  HCT 41.7 42.3  MCV 93.7 92.6  PLT 267 260   Cardiac Enzymes: No results for input(s): CKTOTAL, CKMB, CKMBINDEX, TROPONINI in the last 168 hours. BNP: Invalid input(s): POCBNP CBG: No results for input(s): GLUCAP in the last 168 hours. D-Dimer No results for input(s): DDIMER in the last 72 hours. Hgb A1c No results for input(s): HGBA1C in the last 72 hours. Lipid Profile No results for input(s): CHOL, HDL, LDLCALC, TRIG, CHOLHDL, LDLDIRECT in the last 72 hours. Thyroid function studies No results for input(s): TSH, T4TOTAL, T3FREE, THYROIDAB in the last 72 hours.  Invalid input(s): FREET3 Anemia work up No results for input(s):  VITAMINB12, FOLATE, FERRITIN, TIBC, IRON, RETICCTPCT in the last 72 hours. Microbiology Recent Results (from the past 240 hour(s))  Resp Panel by RT-PCR (Flu A&B, Covid) Nasopharyngeal Swab     Status: None   Collection Time: 12/05/20 12:04 AM   Specimen: Nasopharyngeal Swab; Nasopharyngeal(NP) swabs in vial transport medium  Result Value Ref Range Status   SARS Coronavirus 2 by RT PCR NEGATIVE NEGATIVE Final    Comment: (NOTE) SARS-CoV-2 target nucleic acids are NOT DETECTED.  The SARS-CoV-2 RNA is generally detectable in upper respiratory specimens during the acute phase of infection. The lowest concentration of SARS-CoV-2 viral copies this assay can detect is 138 copies/mL. A negative result does not preclude SARS-Cov-2 infection and should not be used as the sole basis for treatment or other patient management decisions. A negative result may occur with  improper specimen collection/handling, submission of specimen other than nasopharyngeal swab, presence of viral mutation(s) within the areas targeted by this assay, and inadequate number of viral copies(<138 copies/mL). A negative result must be combined with clinical observations, patient history, and epidemiological information. The expected result is Negative.  Fact Sheet for Patients:  BloggerCourse.com  Fact Sheet for Healthcare Providers:  SeriousBroker.it  This test is no t yet approved or cleared by the [place] FDA and  has been authorized for detection and/or diagnosis of SARS-CoV-2 by FDA under an Emergency Use Authorization (EUA). This EUA will remain  in effect (meaning this test can be used) for the duration of the COVID-19 declaration under Section 564(b)(1) of the Act, 21 U.S.C.section 360bbb-3(b)(1), unless the authorization is terminated  or revoked sooner.       Influenza A by PCR NEGATIVE NEGATIVE Final   Influenza B by PCR NEGATIVE NEGATIVE Final     Comment: (NOTE) The Xpert Xpress SARS-CoV-2/FLU/RSV plus assay is intended as an aid in the diagnosis of influenza from Nasopharyngeal swab specimens and should not be used as a sole basis for treatment. Nasal washings and aspirates are unacceptable for Xpert Xpress SARS-CoV-2/FLU/RSV testing.  Fact Sheet for Patients: BloggerCourse.com  Fact Sheet for Healthcare Providers: SeriousBroker.it  This test is not yet approved or cleared by the [place] FDA and has been authorized for detection and/or diagnosis of SARS-CoV-2 by FDA under an Emergency Use Authorization (EUA). This EUA will remain in effect (meaning this test can be used) for the duration of the COVID-19 declaration under Section 564(b)(1) of the Act, 21 U.S.C. section 360bbb-3(b)(1), unless the authorization is terminated or revoked.  Performed at [facility], [address], [place], [place] [address]      Discharge Instructions:   Discharge Instructions     Ambulatory referral to Occupational Therapy   Complete by: As directed    Ambulatory referral to Physical Therapy  Complete by: As directed    Diet - low sodium heart healthy   Complete by: As directed    Discharge instructions   Complete by: As directed    Follow up with Dr. Zada Finders, neurosurgeon, as scheduled   Discharge wound care:   Complete by: As directed    Follow up with Dr. Zada Finders, neurosurgeon, as scheduled   Increase activity slowly   Complete by: As directed       Allergies as of 12/09/2020       Reactions   Epinephrine Other (See Comments)   "dizziness, rapid heartbeat"   Azithromycin    dizziness   Motrin [ibuprofen]    dizziness        Medication List     TAKE these medications    acetaminophen 500 MG tablet Commonly known as: TYLENOL Take 1,000 mg by mouth every 6 (six) hours as needed for moderate pain.   ALPRAZolam 0.25 MG tablet Commonly known  as: XANAX Take 4.5 tablets (1.125 mg total) by mouth daily as needed (Vestibular neuritis). Patient states she takes 4 of the 0.25 mg tablets and then splits one 0.25 mg tablet to make total of 1.125 mg daily for vestibular neuritis   dexamethasone 4 MG tablet Commonly known as: DECADRON Take 1 tablet (4 mg total) by mouth 2 (two) times daily with a meal.   diazepam 5 MG tablet Commonly known as: Valium Take 1 tablet (5 mg total) by mouth once for 1 dose. Take 1 tablet 30 minutes prior to MRI   HYDROcodone-acetaminophen 5-325 MG tablet Commonly known as: NORCO/VICODIN Take 1-2 tablets by mouth every 4 (four) hours as needed for moderate pain.   levETIRAcetam 500 MG tablet Commonly known as: Keppra Take 1 tablet (500 mg total) by mouth 2 (two) times daily.   metoprolol tartrate 25 MG tablet Commonly known as: LOPRESSOR Take 0.5 tablets (12.5 mg total) by mouth 2 (two) times daily.   telmisartan 40 MG tablet Commonly known as: MICARDIS Take 40 mg by mouth daily. What changed: Another medication with the same name was removed. Continue taking this medication, and follow the directions you see here.               Discharge Care Instructions  (From admission, onward)           Start     Ordered   12/09/20 0000  Discharge wound care:       Comments: Follow up with Dr. Zada Finders, neurosurgeon, as scheduled   12/09/20 0930            Follow-up Information     [facility]. Schedule an appointment as soon as possible for a visit.   Specialty: Rehabilitation Why: Please call and schedule an appointment within 24 hours of d/c. Contact information: [address] [place] [id number] [place] [phone]                  If you experience worsening of your admission symptoms, develop shortness of breath, life threatening emergency, suicidal or homicidal thoughts you must seek medical  attention immediately by calling 911 or calling your MD immediately  if symptoms less severe.   You must read complete instructions/literature along with all the possible adverse reactions/side effects for all the medicines you take and that have been prescribed to you. Take any new medicines after you have completely understood and accept all the possible adverse reactions/side effects.    Please note  You were cared for by a hospitalist during your hospital stay. If you have any questions about your discharge medications or the care you received while you were in the hospital after you are discharged, you can call the unit and asked to speak with the hospitalist on call if the hospitalist that took care of you is not available. Once you are discharged, your primary care physician will handle any further medical issues. Please note that NO REFILLS for any discharge medications will be authorized once you are discharged, as it is imperative that you return to your primary care physician (or establish a relationship with a primary care physician if you do not have one) for your aftercare needs so that they can reassess your need for medications and monitor your lab values.       Time coordinating discharge: 31 minutes  Signed:  Patrese Neal  [facility] 12/09/2020, 9:30 AM   Pager on [email or web address]. If 7PM-7AM, please contact night-coverage at [email or web address]

## 2020-12-09 NOTE — Progress Notes (Signed)
Patient ready for discharge, SWAT nurse helping with discharge process. IV removed, D/C teaching given to patient and her family by SWAT RN.

## 2020-12-11 ENCOUNTER — Telehealth: Payer: Self-pay | Admitting: Internal Medicine

## 2020-12-11 NOTE — Telephone Encounter (Signed)
Scheduled new patient appt with Dr. Mickeal Skinner per 11/3 staff msg from [place]. Spoke to pt, she is aware of appt date and time as well as location. She is aware to arrive 15 mins prior to her appt time.

## 2020-12-14 ENCOUNTER — Other Ambulatory Visit: Payer: Self-pay | Admitting: Radiation Therapy

## 2020-12-14 DIAGNOSIS — C719 Malignant neoplasm of brain, unspecified: Secondary | ICD-10-CM

## 2020-12-17 ENCOUNTER — Inpatient Hospital Stay: Payer: BC Managed Care – PPO | Attending: Internal Medicine | Admitting: Internal Medicine

## 2020-12-17 ENCOUNTER — Other Ambulatory Visit: Payer: Self-pay

## 2020-12-17 VITALS — BP 152/51 | HR 55 | Temp 97.0°F | Resp 18 | Wt 165.6 lb

## 2020-12-17 DIAGNOSIS — R569 Unspecified convulsions: Secondary | ICD-10-CM | POA: Diagnosis not present

## 2020-12-17 DIAGNOSIS — C711 Malignant neoplasm of frontal lobe: Secondary | ICD-10-CM | POA: Insufficient documentation

## 2020-12-17 DIAGNOSIS — C719 Malignant neoplasm of brain, unspecified: Secondary | ICD-10-CM

## 2020-12-17 NOTE — Progress Notes (Signed)
[facility] [address]  [place], [place] [address] [phone]   New Patient Evaluation  Date of Service: 12/17/20 Patient Name: Erika Mitchell Patient MRN: [id number] Patient DOB: 1955-07-03 Provider: Henreitta Leber, MD  Identifying Statement:  Erika Mitchell is a 65 y.o. female with right frontal glioblastoma who presents for initial consultation and evaluation.    Referring Provider: Cleatis Polka., MD [address] [place],  [place] [address]  Oncologic History: Oncology History  Glioblastoma, IDH-wildtype [facility])  11/25/2020 Surgery   Craniotomy, resection with Dr. Maurice Small; path is c/w GBM, [facility]   12/04/2020 - 12/07/2020 Hospital Admission   Admitted for worsening left sided weakness, MRI demonstrates likely post-surgical localized inflammatory process.  Improves with corticosteroids.     Biomarkers:  MGMT Unknown.  IDH 1/2 Wild type.  EGFR Unknown  TERT Unknown   History of Present Illness: The patient's records from the referring physician were obtained and reviewed and the patient interviewed to confirm this HPI.  Erika Mitchell presented on 11/23/20 with several episodes of sudden dysfunction of left arm and leg, lasting several minutes, with most recent episode accompanied by frank speech arrest while at work.  First episode was less than one week ago.  Between, she has been normal, at baseline.  CNS imaging demonstrated enhancing mass in the right frontal lobe.  She underwent craniotomy, resection with Dr. Maurice Small on 11/25/20, path demonstrated gliolblastoma.  On 10/28, she returned to the hospital with several days of progressive left sided weakness.  After improvement back to baseline with steroids, she was discharged to home.  She has no new complaints currently, is scheduled for repeat MRI brain next week prior to radiation therapy.    Medications: Current Outpatient Medications on File Prior to Visit   Medication Sig Dispense Refill   acetaminophen (TYLENOL) 500 MG tablet Take 1,000 mg by mouth every 6 (six) hours as needed for moderate pain.     ALPRAZolam (XANAX) 0.25 MG tablet Take 4.5 tablets (1.125 mg total) by mouth daily as needed (Vestibular neuritis). Patient states she takes 4 of the 0.25 mg tablets and then splits one 0.25 mg tablet to make total of 1.125 mg daily for vestibular neuritis     dexamethasone (DECADRON) 4 MG tablet Take 1 tablet (4 mg total) by mouth 2 (two) times daily with a meal. 60 tablet 2   HYDROcodone-acetaminophen (NORCO/VICODIN) 5-325 MG tablet Take 1-2 tablets by mouth every 4 (four) hours as needed for moderate pain. 30 tablet 0   levETIRAcetam (KEPPRA) 500 MG tablet Take 1 tablet (500 mg total) by mouth 2 (two) times daily. 60 tablet 5   metoprolol tartrate (LOPRESSOR) 25 MG tablet Take 0.5 tablets (12.5 mg total) by mouth 2 (two) times daily. 60 tablet 0   telmisartan (MICARDIS) 40 MG tablet Take 40 mg by mouth daily.     No current facility-administered medications on file prior to visit.    Allergies:  Allergies  Allergen Reactions   Epinephrine Other (See Comments)    "dizziness, rapid heartbeat"   Azithromycin     dizziness   Motrin [Ibuprofen]     dizziness   Past Medical History:  Past Medical History:  Diagnosis Date   Cancer (HCC)    basal cell carcinoma on left arm left eye brow   Hypertension    Vertigo    Vestibular neuritis    right ear   Past Surgical History:  Past  Surgical History:  Procedure Laterality Date   APPLICATION OF CRANIAL NAVIGATION Right 11/25/2020   Procedure: APPLICATION OF CRANIAL NAVIGATION;  Surgeon: Jadene Pierini, MD;  Location: [facility] OR;  Service: Neurosurgery;  Laterality: Right;   CHOLECYSTECTOMY  2017   CRANIOTOMY Right 11/25/2020   Procedure: CRANIOTOMY FOR  TUMOR RESECTION WITH Normajean Glasgow;  Surgeon: Jadene Pierini, MD;  Location: [facility] OR;  Service: Neurosurgery;  Laterality: Right;   laproscopy      surgery for endometriosisi   OOPHORECTOMY  1974&1990   rt then left ovary   Social History:  Social History   Socioeconomic History   Marital status: Married    Spouse name: Jomarie Longs   Number of children: 1   Years of education: 14   Highest education level: Not on file  Occupational History    Employer: [employer]  Tobacco Use   Smoking status: Former    Types: Cigarettes    Quit date: 02/08/1996    Years since quitting: 24.8   Smokeless tobacco: Never  Vaping Use   Vaping Use: Never used  Substance and Sexual Activity   Alcohol use: Yes    Alcohol/week: 2.0 standard drinks    Types: 2 Glasses of wine per week   Drug use: No   Sexual activity: Not on file  Other Topics Concern   Not on file  Social History Narrative   Patient is married Jomarie Longs).     Patient is left-handed.   Patient is working full-time.   Patient has a college education (Associate's)   Patient has one child.   Patient drinks 3 Cups of coffee daily.   Social Determinants of Health   Financial Resource Strain: Not on file  Food Insecurity: Not on file  Transportation Needs: Not on file  Physical Activity: Not on file  Stress: Not on file  Social Connections: Not on file  Intimate Partner Violence: Not on file   Family History:  Family History  Problem Relation Age of Onset   Lung cancer Mother    Aneurysm Father    Heart disease Sister    Atrial fibrillation Brother    Colon cancer Neg Hx     Review of Systems: Constitutional: Doesn't report fevers, chills or abnormal weight loss Eyes: Doesn't report blurriness of vision Ears, nose, mouth, throat, and face: Doesn't report sore throat Respiratory: Doesn't report cough, dyspnea or wheezes Cardiovascular: Doesn't report palpitation, chest discomfort  Gastrointestinal:  Doesn't report nausea, constipation, diarrhea GU: Doesn't report incontinence Skin: Doesn't report skin rashes Neurological: Per HPI Musculoskeletal:  Doesn't report joint pain Behavioral/Psych: Doesn't report anxiety  Physical Exam: Vitals:   12/17/20 1415  BP: (!) 152/51  Pulse: (!) 55  Resp: 18  Temp: (!) 97 F (36.1 C)  SpO2: 100%   KPS: 90. General: Alert, cooperative, pleasant, in no acute distress Head: Normal EENT: No conjunctival injection or scleral icterus.  Lungs: Resp effort normal Cardiac: Regular rate Abdomen: Non-distended abdomen Skin: No rashes cyanosis or petechiae. Extremities: No clubbing or edema  Neurologic Exam: Mental Status: Awake, alert, attentive to examiner. Oriented to self and environment. Language is fluent with intact comprehension.  Cranial Nerves: Visual acuity is grossly normal. Visual fields are full. Extra-ocular movements intact. No ptosis. Face is symmetric Motor: Tone and bulk are normal. Power is full in both arms and legs. Reflexes are symmetric, no pathologic reflexes present.  Sensory: Intact to light touch Gait: Normal.   Labs: I have reviewed the data as listed  Component Value Date/Time   NA 138 12/05/2020 0507   K 3.8 12/05/2020 0507   CL 105 12/05/2020 0507   CO2 22 12/05/2020 0507   GLUCOSE 114 (H) 12/05/2020 0507   BUN 9 12/05/2020 0507   CREATININE 0.67 12/05/2020 0507   CALCIUM 9.7 12/05/2020 0507   PROT 6.7 12/05/2020 0507   ALBUMIN 3.8 12/05/2020 0507   AST 15 12/05/2020 0507   ALT 20 12/05/2020 0507   ALKPHOS 58 12/05/2020 0507   BILITOT 1.0 12/05/2020 0507   GFRNONAA >60 12/05/2020 0507   Lab Results  Component Value Date   WBC 5.9 12/05/2020   NEUTROABS 3.2 12/04/2020   HGB 14.5 12/05/2020   HCT 42.3 12/05/2020   MCV 92.6 12/05/2020   PLT 260 12/05/2020    Imaging: [place] Clinician Interpretation: I have personally reviewed the CNS images as listed.  My interpretation, in the context of the patient's clinical presentation, is  inflammatory reaction  CT HEAD WO CONTRAST (5MM)  Result Date: 12/04/2020 CLINICAL DATA:  Neuro deficit, acute,  stroke suspected. Status post tumor removal. Left-sided weakness and facial droop. EXAM: CT HEAD WITHOUT CONTRAST TECHNIQUE: Contiguous axial images were obtained from the base of the skull through the vertex without intravenous contrast. COMPARISON:  CT head without contrast 11/23/2020. MR head without and with contrast 11/26/2020. FINDINGS: Brain: Patient is status post right frontal craniotomy. Gas is present surgical cavity. Marked edema surrounds the operative site. The extent of surrounding vasogenic edema has increased. There is now mass effect with effacement of the sulci. No significant midline shift is present. Minimal blood products are again noted within the operative cavity. No significant new hemorrhage is present. The ventricles are of normal size. No significant extra-axial fluid collection is present. The brainstem and cerebellum are within normal limits. Vascular: No hyperdense vessel or unexpected calcification. Skull: Right frontal craniotomy.  Calvarium is otherwise intact. Sinuses/Orbits: The paranasal sinuses and mastoid air cells are clear. The globes and orbits are within normal limits. IMPRESSION: 1. Status post right frontal craniotomy for tumor resection. 2. Marked edema surrounding the operative site has increased. 3. There is now mass effect with effacement of the sulci. 4. No significant midline shift. 5. Minimal blood products are again noted within the operative cavity. 6. No significant new hemorrhage. These results were called by telephone at the time of interpretation on 12/04/2020 at 6:04 pm to provider [facility] , who verbally acknowledged these results. Electronically Signed   By: San Morelle M.D.   On: 12/04/2020 18:04   CT HEAD WO CONTRAST (5MM)  Result Date: 11/23/2020 CLINICAL DATA:  Neuro deficit, acute, stroke suspected Mental status change, unknown cause EXAM: CT HEAD WITHOUT CONTRAST TECHNIQUE: Contiguous axial images were obtained from the base of  the skull through the vertex without intravenous contrast. COMPARISON:  None. FINDINGS: Brain: Area of low-density noted in the right frontal lobe concerning for vasogenic edema surrounding a mass. No hemorrhage. No midline shift. No hydrocephalus. Vascular: No hyperdense vessel or unexpected calcification. Skull: No acute calvarial abnormality. Sinuses/Orbits: No acute findings Other: None IMPRESSION: Edema within the right frontal lobe concerning for vasogenic edema surrounding a right frontal mass lesion. Recommend further evaluation with MRI with and without contrast. These results were called by telephone at the time of interpretation on 11/23/2020 at 5:05 pm to provider Sherwood Gambler , who verbally acknowledged these results. Electronically Signed   By: Rolm Baptise M.D.   On: 11/23/2020 17:09   MR BRAIN W WO  CONTRAST  Result Date: 12/04/2020 CLINICAL DATA:  Progressive left upper and lower extremity weakness. Status post resection of brain tumor. EXAM: MRI HEAD WITHOUT AND WITH CONTRAST TECHNIQUE: Multiplanar, multiecho pulse sequences of the brain and surrounding structures were obtained without and with intravenous contrast. CONTRAST:  7.55mL GADAVIST GADOBUTROL 1 MMOL/ML IV SOLN COMPARISON:  CT head without contrast 12/04/2020. MR head without and with contrast 11/26/2020 and 11/23/2020. FINDINGS: Brain: The surgical cavity has expanded. Peripheral enhancement is noted. The cavity measures 3.0 x 3.5 x 3.6 cm. Marked surrounding edema has progressed, as seen on the previous CT scan. This results in effacement of the sulci and partial effacement the right lateral ventricle. Midline shift is 3 mm right to left. Progressive dural enhancement is reactive over the right hemisphere. No distal enhancement is present. Cortical and subcortical edema is present along the medial left frontal lobe. 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 The brainstem and cerebellum are otherwise  unremarkable. The internal auditory canals are within normal limits. Vascular: Flow is present in the major intracranial arteries. Skull and upper cervical spine: The craniocervical junction is normal. Upper cervical spine is within normal limits. Marrow signal is unremarkable. Sinuses/Orbits: The paranasal sinuses and mastoid air cells are clear. The globes and orbits are within normal limits. IMPRESSION: 1. Progressive enlargement of a surgical cavity with peripheral enhancement. Given the rapid change in thick peripheral enhancement, this is most concerning for infection. 2. Stable blood products. 3. No evidence for significant tumor. 4. Progressive surrounding edema and mass effect with 3 mm right to left midline shift. 5. Stable cortical and subcortical edema in the medial left frontal lobe. 6. 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 These results were called by telephone at the time of interpretation on 12/04/2020 at 9:16 pm to provider [facility], who verbally acknowledged these results. Electronically Signed   By: San Morelle M.D.   On: 12/04/2020 21:17   MR BRAIN W WO CONTRAST  Result Date: 11/26/2020 CLINICAL DATA:  CNS neoplasm, status post craniotomy for tumor resection EXAM: MRI HEAD WITHOUT AND WITH CONTRAST TECHNIQUE: Multiplanar, multiecho pulse sequences of the brain and surrounding structures were obtained without and with intravenous contrast. CONTRAST:  69mL GADAVIST GADOBUTROL 1 MMOL/ML IV SOLN COMPARISON:  11/23/2020 FINDINGS: Brain: Status post interval right frontal craniotomy with subjacent resection cavity in the right frontal lobe, which is filled with fluid and air. Some T1 hyperintense material around the periphery, likely postoperative blood products. Additional enhancement at the superior aspect of the resection cavity (series 18, image 40), may be postoperative or could represent residual tumor. Small fluid collection subjacent to the craniotomy  flap, measuring to 7 mm, with air anterior to the right frontal lobe, not unexpected postoperatively. Slightly decreased surrounding T2 hyperintense signal in the right frontal lobe. Restricted diffusion about the resection cavity is not unexpected postoperatively. No midline shift. No hydrocephalus. Vascular: Normal flow voids. Skull and upper cervical spine: Status post right frontal craniotomy. Otherwise normal marrow signal. Sinuses/Orbits: Negative. Other: Fluid in the subcutaneous tissues overlying the craniotomy flap, not unexpected postoperatively. IMPRESSION: Expected postoperative appearance, status post right frontal craniotomy and resection of subjacent mass. Minimal enhancement along the superior aspect of the resection cavity may be postoperative or could represent residual tumor. Attention on follow-up. Electronically Signed   By: Merilyn Baba M.D.   On: 11/26/2020 01:33   MR Brain W and Wo Contrast  Result Date: 11/23/2020 CLINICAL DATA:  Abnormal  head CT.  Right frontal mass. EXAM: MRI HEAD WITHOUT AND WITH CONTRAST TECHNIQUE: Multiplanar, multiecho pulse sequences of the brain and surrounding structures were obtained without and with intravenous contrast. CONTRAST:  8.79mL GADAVIST GADOBUTROL 1 MMOL/ML IV SOLN COMPARISON:  Head CT same day FINDINGS: Brain: There is an up to 3 cm in diameter necrotic peripheral intra-axial mass in the right posterior frontal lobe with surrounding vasogenic edema. The lesion extends to the surface of the brain. Most likely diagnosis is necrotic brain tumor, either primary or metastatic. Brain abscess is considered but the internal characteristics are not entirely typical. No midline shift or threatening herniation. Otherwise, the brain is normal without second lesion, ischemic change, hydrocephalus or extra-axial collection. Vascular: Major vessels at the base of the brain show flow. Skull and upper cervical spine: Negative Sinuses/Orbits: Clear/normal Other:  None IMPRESSION: Approximately 3 cm in diameter necrotic lesion in the posterior right frontal lobe with surrounding vasogenic edema, most consistent with malignant brain tumor, either primary or metastatic. No second lesion is seen. The possibility of brain abscess was considered, but the internal characteristics are not typical of that diagnosis. Electronically Signed   By: Nelson Chimes M.D.   On: 11/23/2020 19:25   CT CHEST ABDOMEN PELVIS W CONTRAST  Result Date: 11/23/2020 CLINICAL DATA:  Cancer of unknown primary. Brain mass found on head CT. EXAM: CT CHEST, ABDOMEN, AND PELVIS WITH CONTRAST TECHNIQUE: Multidetector CT imaging of the chest, abdomen and pelvis was performed following the standard protocol during bolus administration of intravenous contrast. CONTRAST:  4mL OMNIPAQUE IOHEXOL 350 MG/ML SOLN COMPARISON:  None. FINDINGS: CT CHEST FINDINGS Cardiovascular: No significant vascular findings. Normal heart size. No pericardial effusion. Mediastinum/Nodes: No enlarged mediastinal, hilar, or axillary lymph nodes. Thyroid gland, trachea, and esophagus demonstrate no significant findings. Lungs/Pleura: There is some scarring in both lung apices. There is minimal atelectasis in the lower lobes bilaterally. There is minimal scarring or atelectasis in the lingula. The lungs are otherwise clear. Musculoskeletal: There is a single small sclerotic lesion in the T9 vertebral body measuring 5 mm. No acute fractures are seen. CT ABDOMEN PELVIS FINDINGS Hepatobiliary: The gallbladder is surgically absent. There is no biliary ductal dilatation. The liver is within normal limits Pancreas: There is questionable trace inflammatory stranding surrounding the body of the pancreas. Pancreas is otherwise within normal limits. There is no ductal dilatation or fluid collection. Spleen: Normal in size without focal abnormality. Adrenals/Urinary Tract: Adrenal glands are unremarkable. Kidneys are normal, without renal calculi,  focal lesion, or hydronephrosis. Bladder is unremarkable. Stomach/Bowel: Stomach is within normal limits. Appendix appears normal. No evidence of bowel wall thickening, distention, or inflammatory changes. Vascular/Lymphatic: Aortic atherosclerosis. No enlarged abdominal or pelvic lymph nodes. Reproductive: Uterus and bilateral adnexa are unremarkable. Other: There is a small fat containing umbilical hernia. There is no ascites or free air. Musculoskeletal: There is an 8 mm sclerotic density in the left iliac wing, indeterminate. No fractures are identified. IMPRESSION: 1. Questionable mild fat stranding surrounding the pancreatic body. Please correlate clinically for mild acute pancreatitis. 2. There are 2 small indeterminate sclerotic osseous lesions (thoracic spine and left iliac wing). 3. No acute cardiopulmonary process. Electronically Signed   By: Ronney Asters M.D.   On: 11/23/2020 18:14    Pathology: SURGICAL PATHOLOGY  CASE: [id number]  PATIENT: [facility]  Surgical Pathology Report   Clinical History: Brain tumor (ms)    FINAL MICROSCOPIC DIAGNOSIS:   A. BRAIN, RIGHT, BIOPSY:  -  Glioblastoma, IDH-wild-type, WHO  grade 4  -  See comment   COMMENT:   The above diagnosis and following comment were provided by Dr. Linton Rump  from [facility] (please see scanned copy in patient's electronic  medical record):   "Sections reveal a densely cellular infiltrating glial neoplasm composed  of cells with enlarged and irregularly shaped hyperchromatic nuclei with  eosinophilic to clear cytoplasm embedded within a fibrillar background.  Frequent mitotic features are identified, as well as areas of necrosis  and foci of microvascular proliferation.   Immunohistochemical stains were performed at [facility] and demonstrate the  tumor cells are immunoreactive for GFAP and OLIG2.  Synaptophysin  highlights entrapped axons.  IDH1 R132H mutant protein is negative, ATRX  expression is retained, and  p53 labels a minority of tumor cells.  The  Ki-67 labeling index is moderate.   Overall, the findings are those of a glioblastoma, IDH-wild-type, CNS  WHO grade 4."    GROSS DESCRIPTION:   A.  Received fresh and subsequently placed in formalin labeled with the  patient's name and "Right brain tumor" is a 2.4 x 2.0 x 2.0 cm piece of  red-tan, friable soft tissue fragment that is sectioned and entirely  submitted in cassettes.   (LEF 11/26/2020)    Final Diagnosis performed by Thressa Sheller, MD.   Electronically signed  12/07/2020  Technical and / or Professional components performed at [facility]. [facility], [place] [address] [place], [place] [address].   Immunohistochemistry Technical component (if applicable) was performed  at [facility]. [address], [place],  [place],  [address].   IMMUNOHISTOCHEMISTRY DISCLAIMER (if applicable):  Some of these immunohistochemical stains may have been developed and the  performance characteristics determine by [facility]. Some  may not have been cleared or approved by the U.S. Food and Drug  Administration. The FDA has determined that such clearance or approval  is not necessary. This test is used for clinical purposes. It should not  be regarded as investigational or for research. This laboratory is  certified under the [employer] [place]  (CLIA-88) as qualified to perform high complexity clinical laboratory  testing.  The controls stained appropriately.    Assessment/Plan Glioblastoma, IDH-wildtype ([place])  Seizures ([place])  We appreciate the opportunity to participate in the care of ELCIE PELSTER.   We had an extensive conversation with her and her husband regarding pathology, prognosis, and available treatment pathways.  We are encouraged by her good functional status, gross total resection, right frontal tumor location, lack of comorbidity.   We  ultimately recommended proceeding with course of intensity modulated radiation therapy and concurrent daily Temozolomide.  Radiation will be administered Mon-Fri over 6 weeks, Temodar will be dosed at $Remove'75mg'YvEDUDH$ /m2 to be given daily over 42 days.  We reviewed side effects of temodar, including fatigue, nausea/vomiting, constipation, and cytopenias.  Informed consent was verbally obtained at bedside to proceed with oral chemotherapy.  Chemotherapy should be held for the following:  ANC less than 1,000  Platelets less than 100,000  LFT or creatinine greater than 2x ULN  If clinical concerns/contraindications develop  Every 2 weeks during radiation, labs will be checked accompanied by a clinical evaluation in the brain tumor clinic.  For recent clinical decompensation with inflammatory findings on MRI; we will follow up with repeat MRI brain prior to radiation to generate accurate dosimetry/field.  Decadron should be decreased to $RemoveBefo'4mg'RtyhmIkwqJA$  daily, can reassess with radiation oncology on 11/18 following MRI.  Could drop further to $RemoveBe'2mg'xDXLEsCxA$  at that time.  We also discussed and patient consented for additional tumor profiling and sequencing through [facility].  Advanced tumor profiling could help identify actionable mutation for targeted therapy and lead to direct clinical benefit.     Screening for potential clinical trials was performed and discussed using eligibility criteria for active protocols at [facility], loco-regional tertiary centers, as well as national database available on [email or web address].    The patient is interested in hearing about our Ramipril to Prevent Memory Loss study.  We will have one of our research nurses reach out to her with additional information regarding the study.  We spent twenty additional minutes teaching regarding the natural history, biology, and historical experience in the treatment of brain tumors. We then discussed in detail the current recommendations for therapy focusing  on the mode of administration, mechanism of action, anticipated toxicities, and quality of life issues associated with this plan. We also provided teaching sheets for the patient to take home as an additional resource.  All questions were answered. The patient knows to call the clinic with any problems, questions or concerns. No barriers to learning were detected.  The total time spent in the encounter was 60 minutes and more than 50% was on counseling and review of test results   Ventura Sellers, MD Medical Director of Neuro-Oncology [facility] 12/17/20 2:09 PM

## 2020-12-18 ENCOUNTER — Other Ambulatory Visit: Payer: Self-pay | Admitting: Neurological Surgery

## 2020-12-18 ENCOUNTER — Telehealth: Payer: Self-pay | Admitting: Emergency Medicine

## 2020-12-18 NOTE — Telephone Encounter (Signed)
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  Gave brief description of study to patient who states interest in enrolling.  Confirmed email address with patient and sent copies of current consent and current HIPAA consent.  Will contact pt on Monday to f/u with any questions/concerns and set up consent appt.  Pt provided with contact info for this Clinical Research Nurse to call back before then as needed.  Wells Guiles 'Learta CoddingNeysa Bonito, RN, BSN Clinical Research Nurse I 12/18/20 1:21 PM

## 2020-12-21 ENCOUNTER — Telehealth: Payer: Self-pay | Admitting: *Deleted

## 2020-12-21 ENCOUNTER — Telehealth: Payer: Self-pay | Admitting: Emergency Medicine

## 2020-12-21 NOTE — Telephone Encounter (Signed)
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  After reviewing consents in detail patient has declined study.  Pt states she is too overwhelmed at this time to participate.  Pt briefly introduced to [id number], will f/u with her regarding this study at upcoming CC appts.  Pt denies any questions/concerns at this time.  Wells Guiles 'Learta CoddingNeysa Bonito, RN, BSN Clinical Research Nurse I 12/21/20 1:32 PM

## 2020-12-21 NOTE — Telephone Encounter (Signed)
Caris completed

## 2020-12-22 ENCOUNTER — Encounter: Payer: BC Managed Care – PPO | Admitting: Dietician

## 2020-12-22 ENCOUNTER — Telehealth: Payer: Self-pay | Admitting: Dietician

## 2020-12-22 NOTE — Telephone Encounter (Signed)
Nutrition  Patient identified on malnutrition screening tool report (MST) for weight loss.   Spoke with patient via telephone, introduced self and services available at [facility]. Patient appreciative of call and agreeable to telephone visit. Patient reports appetite has improved and eating 3 meals, often snacking in between. Patient reports poor intake during hospital stay, states the food was "terrible." Patient reports further decline in appetite and intake secondary to persistent diarrhea followed by gastritis from steroids. She tried Ensure during hospitalization, reports she did not tolerate these. Patient reports sometimes dairy does not agree with her. Patient reports her husband is a Well[employer] geologist and she is eating a variety of high calorie, high protein foods with intentions of gaining weight prior to starting treatment. Patient reports she has gained 5 lbs since returning home, now 165 lb on home scale. Patient denies nutrition impact symptoms.  Intervention: Encouraged small frequent meals and snacks with adequate calories and protein - will mail handout with snack ideas Discussed foods with protein, recommended protein source with all meals and snacks  Will mail recipes for lactose-free shakes Encouraged patient to contact with nutrition questions/concerns Will mail contact information

## 2020-12-24 ENCOUNTER — Ambulatory Visit
Admission: RE | Admit: 2020-12-24 | Discharge: 2020-12-24 | Disposition: A | Discharge status: Home / Self Care | Payer: BC Managed Care – PPO | Source: Ambulatory Visit | Attending: Internal Medicine | Admitting: Internal Medicine

## 2020-12-24 ENCOUNTER — Telehealth: Payer: Self-pay | Admitting: Pharmacist

## 2020-12-24 ENCOUNTER — Other Ambulatory Visit: Payer: Self-pay | Admitting: Internal Medicine

## 2020-12-24 ENCOUNTER — Other Ambulatory Visit: Payer: Self-pay

## 2020-12-24 ENCOUNTER — Other Ambulatory Visit (HOSPITAL_COMMUNITY): Payer: Self-pay

## 2020-12-24 ENCOUNTER — Encounter: Payer: Self-pay | Admitting: Internal Medicine

## 2020-12-24 DIAGNOSIS — C719 Malignant neoplasm of brain, unspecified: Secondary | ICD-10-CM

## 2020-12-24 IMAGING — MR MR HEAD WO/W CM
14 series · 48 of 48 positions shown · IV contrast (multihance)
Comparison: MR/CT head [DATE].

CLINICAL DATA: Brain tumor resection [DATE]

EXAM:
MRI HEAD WITHOUT AND WITH CONTRAST
TECHNIQUE: Multiplanar, multiecho pulse sequences of the brain and surrounding
structures were obtained without and with intravenous contrast.
CONTRAST:  15mL MULTIHANCE GADOBENATE DIMEGLUMINE 529 MG/ML IV SOLN

[Series 5: T1 · sagittal · 4.0mm · 0.75mm/px · 1 of 31 slices shown (1 of 3)]
[im 1/31]
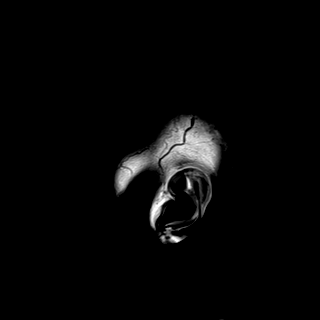

[Series 6: DWI · axial · 3.0mm · 0.94mm/px · z∈[-40,+98]mm · 7 of 160 slices shown (1 of 3)]
[im 1/160]
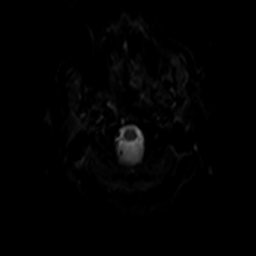
[im 27/160]
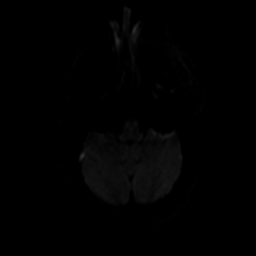
[im 54/160]
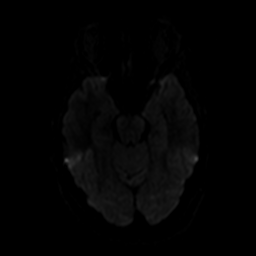
[im 80/160]
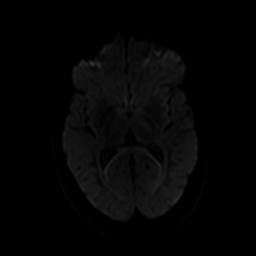
[im 107/160]
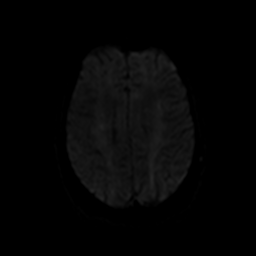
[im 133/160]
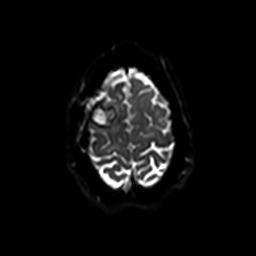
[im 160/160]
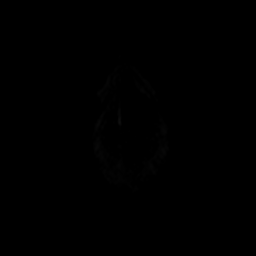

[Series 7: ax dwi_tracew · axial · 3.0mm · 0.94mm/px · z∈[-40,+98]mm · 4 of 80 slices shown]
[im 1/80]
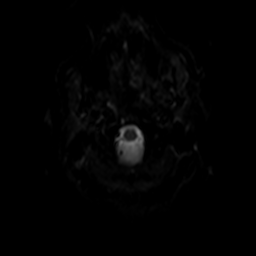
[im 27/80]
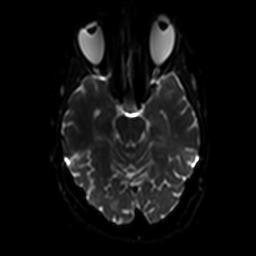
[im 53/80]
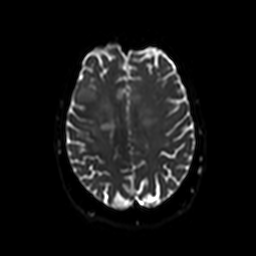
[im 80/80]
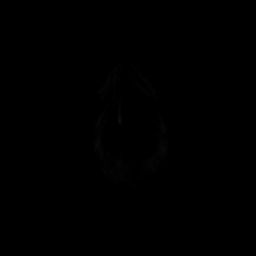

[Series 8: ax dwi_adc · axial · 3.0mm · 0.94mm/px · z∈[-40,+98]mm · 2 of 40 slices shown]
[im 1/40]
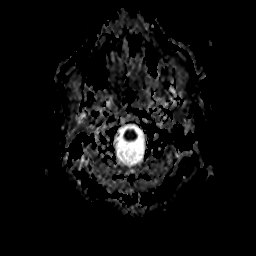
[im 40/40]
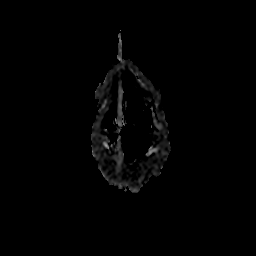

[Series 9: DWI · coronal · 5.0mm · 1.44mm/px · 3 of 66 slices shown (2 of 3)]
[im 1/66]
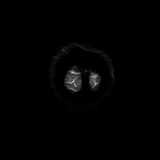
[im 33/66]
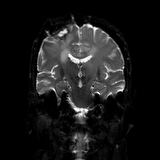
[im 66/66]
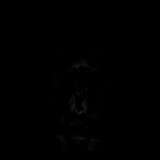

[Series 10: DWI · coronal · 5.0mm · 1.44mm/px · 2 of 33 slices shown (3 of 3)]
[im 1/33]
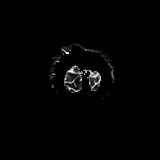
[im 33/33]
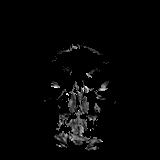

[Series 11: T2 · axial · 4.0mm · 0.36mm/px · 1 of 30 slices shown]
[im 1/30]
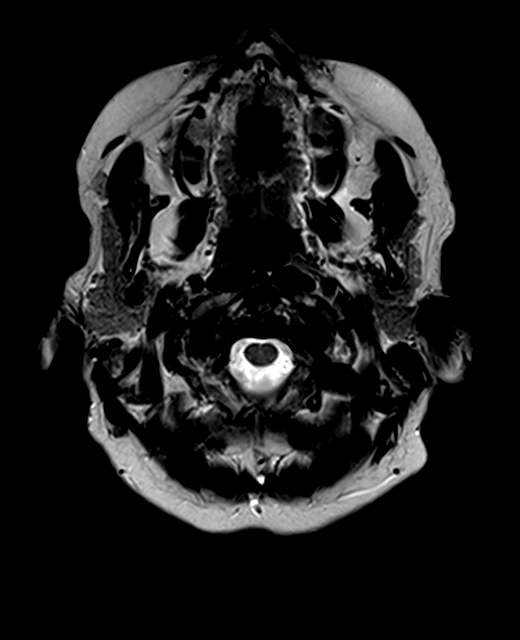

[Series 12: FLAIR · axial · 3.0mm · 0.72mm/px · 1 of 26 slices shown]
[im 1/26]
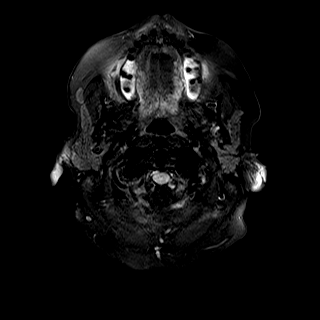

[Series 13: swi_images · axial · 1.5mm · 0.90mm/px · z∈[-38,+102]mm · 5 of 96 slices shown]
[im 1/96]
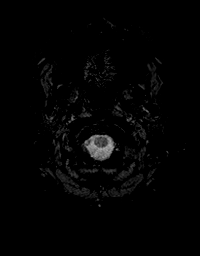
[im 24/96]
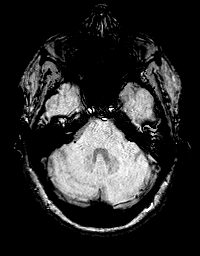
[im 48/96]
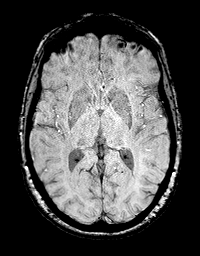
[im 72/96]
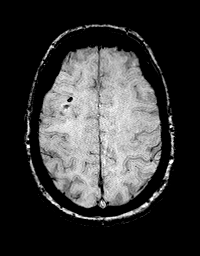
[im 96/96]
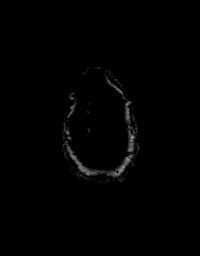

[Series 15: T1 · axial · 1.0mm · 0.94mm/px · z∈[-45,+111]mm · 8 of 160 slices shown (2 of 3)]
[im 1/160]
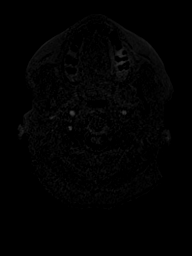
[im 23/160]
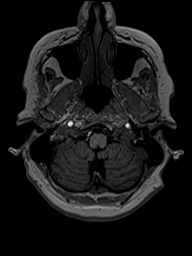
[im 46/160]
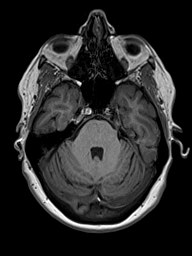
[im 69/160]
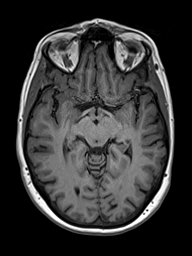
[im 91/160]
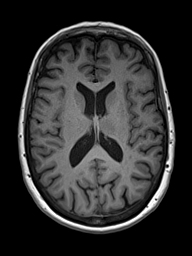
[im 114/160]
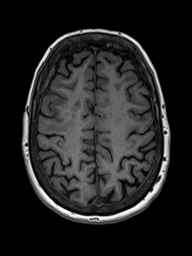
[im 137/160]
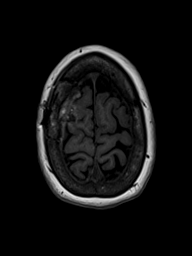
[im 160/160]
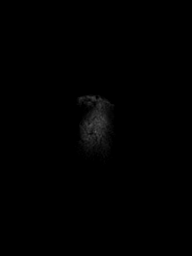

[Series 16: T2 post-contrast · coronal · 4.0mm · 0.36mm/px · 2 of 37 slices shown]
[im 1/37]
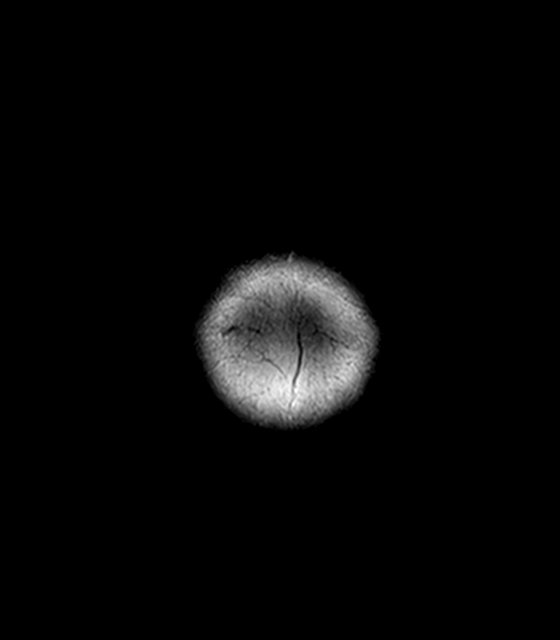
[im 37/37]
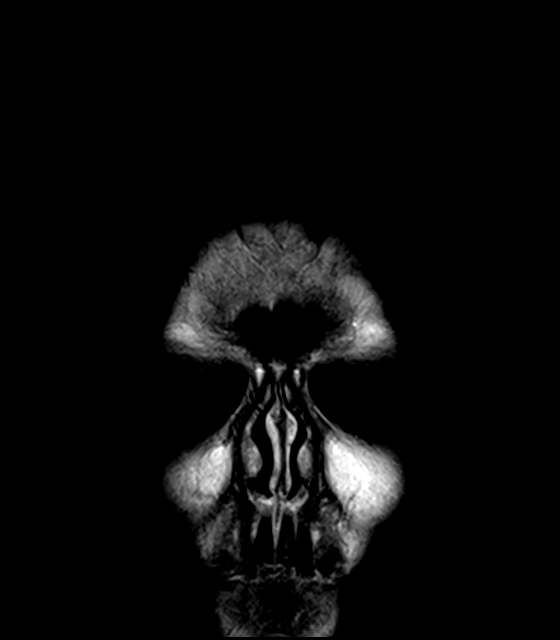

[Series 17: T1 · axial · 1.0mm · 0.94mm/px · z∈[-45,+111]mm · 8 of 160 slices shown (3 of 3)]
[im 1/160]
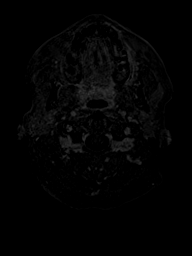
[im 23/160]
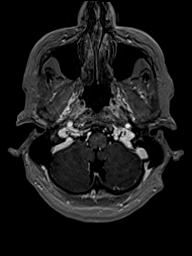
[im 46/160]
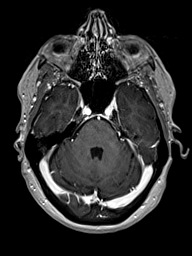
[im 69/160]
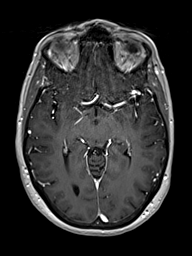
[im 91/160]
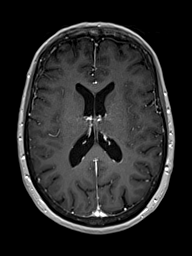
[im 114/160]
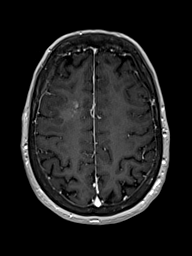
[im 137/160]
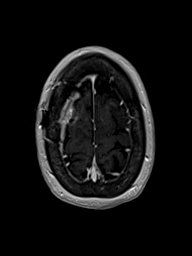
[im 160/160]
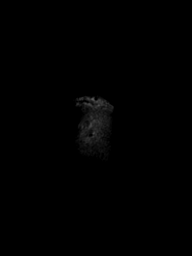

[Series 18: T1 post-contrast · coronal · 4.0mm · 0.72mm/px · 2 of 37 slices shown (1 of 2)]
[im 1/37]
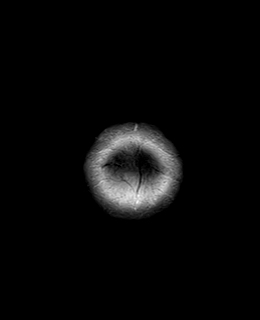
[im 37/37]
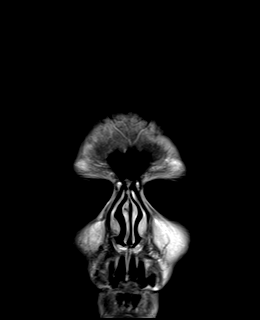

[Series 19: T1 post-contrast · sagittal · 4.0mm · 0.75mm/px · 2 of 31 slices shown (2 of 2)]
[im 1/31]
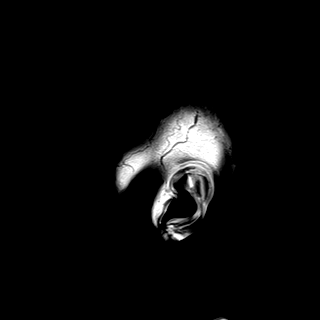
[im 31/31]
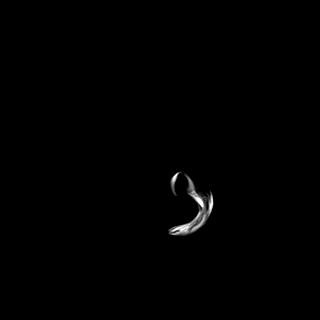

[48 of 48 positions shown; findings below may reference images not displayed]

FINDINGS: Brain: Postsurgical changes reflecting right frontoparietal
craniotomy for mass resection are again seen. There is been interval
evolution of the blood products within the resection cavity. The
cavity has slightly decreased in size since [DATE]. SWI signal
dropout within and at the periphery of the cavity are consistent
with postsurgical blood products. There is patchy diffusion
restriction within portions of the resection cavity.

There is irregular curvilinear enhancement at the periphery of the
cavity. Specifically, there is a 4 mm focus of nodular enhancement
along the inferior margin of the cavity which is increased in
conspicuity (17-113). Otherwise, the enhancement is overall similar
in extent compared to the study from [DATE] allowing for
interval collapse of the cavity, but increased compared to the
immediate postoperative study dated [DATE]. FLAIR signal
abnormality surrounding the resection cavity has significantly
improved since [DATE]. There is associated decreased sulcal
effacement and re-expansion of the right lateral ventricle.

Dural thickening underlying the craniotomy site is likely
postoperative.

There is nonenhancing FLAIR signal abnormality in the left thalamus
which is not significantly changed compared to the initial study
from [DATE]. Additionally, there is nonenhancing FLAIR signal
abnormality extending inferiorly from the resection site in the
right frontal lobe. This appears increased in conspicuity compared
to the study from [DATE]. This lesion was likely masked by
significant surrounding edema on the study from [DATE].

There is no midline shift.

Vascular: Normal flow voids.

Skull and upper cervical spine: There is no marrow signal
abnormality. As above, status post right frontoparietal craniotomy.

Sinuses/Orbits: The paranasal sinuses are clear. The globes and
orbits are unremarkable.

Other: None.
IMPRESSION: 1. Postsurgical changes reflecting right frontal lobe mass
resection. There has been slight interval decrease in size of the
resection cavity with ongoing evolution of internal blood products.
2. A focus of nodular enhancement along the inferior margin of the
cavity is increased in conspicuity since [DATE]. Otherwise, the
curvilinear enhancement at the periphery of the cavity is grossly
similar to the prior study from [DATE] allowing for interval
collapse of the cavity, but increased since the immediate
postoperative study from [DATE]. Findings may reflect evolving
postsurgical change; however, residual tumor cannot be entirely
excluded. Continued attention on follow-up.
3. Significant interval decrease in extent of FLAIR signal
abnormality/edema surrounding the resection cavity with decreased
sulcal effacement and re-expansion of the right lateral ventricle.
Midline shift has resolved.
4. Nonenhancing FLAIR signal abnormality extending inferiorly from
the resection margin in the right frontal lobe described above is
increased in conspicuity compared to the preoperative study.
Infiltrative tumor cannot be excluded. Nonspecific FLAIR signal
abnormality in the left thalamus is unchanged compared to the
initial study from [DATE]. Continued attention on follow-up.

## 2020-12-24 MED ORDER — TEMOZOLOMIDE 140 MG PO CAPS
140.0000 mg | ORAL_CAPSULE | Freq: Every day | ORAL | 0 refills | Status: DC
  Filled 2020-12-24: qty 45, 45d supply, fill #0
  Filled 2020-12-28: qty 28, 28d supply, fill #0
  Filled 2021-01-19: qty 14, 14d supply, fill #1

## 2020-12-24 MED ORDER — GADOBENATE DIMEGLUMINE 529 MG/ML IV SOLN
15.0000 mL | Freq: Once | INTRAVENOUS | Status: AC | PRN
  Administered 2020-12-24: 13:00:00 15 mL via INTRAVENOUS

## 2020-12-24 MED ORDER — ONDANSETRON HCL 8 MG PO TABS
8.0000 mg | ORAL_TABLET | Freq: Two times a day (BID) | ORAL | 1 refills | Status: DC | PRN
  Filled 2020-12-24 – 2020-12-28 (×2): qty 30, 15d supply, fill #0
  Filled 2021-01-19: qty 30, 15d supply, fill #1

## 2020-12-24 NOTE — Telephone Encounter (Signed)
Oral Oncology Pharmacist Encounter   Received notification from [facility] that prior authorization for Temodar is required.   PA submitted on CoverMyMeds Key: [id number] Status is pending   Oral Oncology Clinic will continue to follow.   Leron Croak, PharmD, BCPS Hematology/Oncology Clinical Pharmacist [facility] [phone] 12/24/2020 4:07 PM

## 2020-12-24 NOTE — Progress Notes (Signed)
Radiation Oncology         [phone] ________________________________  Initial Outpatient Consultation  Name: Erika Mitchell MRN: [id number]  Date: 12/25/2020  DOB: [birth year]  IZ:TIWP, Emily Filbert., MD  Judith Part, MD   REFERRING PHYSICIAN: Judith Part, MD  DIAGNOSIS: C71.1   ICD-10-CM   1. GBM (glioblastoma multiforme) ([place])  C71.9 Ambulatory referral to Social Work    2. Malignant neoplasm of frontal lobe of brain ([facility])  C71.1       Glioblastoma, IDH-wild-type, WHO grade 4    Cancer Staging  No matching staging information was found for the patient.  CHIEF COMPLAINT: Here to discuss management of brain cancer/mass  HISTORY OF PRESENT ILLNESS::Erika Mitchell is a 65 y.o. female who presented to the ED (and was admitted) on 11/23/20 with paroxysmal episodes of speech difficulty and left sided clumsiness concerning for simple partial seizures. CT of the head on 11/23/20 demonstrated edema (concerning for vasogenic edema) within the right frontal lobe surrounding a right frontal mass lesion. MRI performed on 11/23/20 further revealed a necrotic lesion in the posterior right frontal lobe, measuring approximately 3 cm in diameter, with surrounding vasogenic edema. Findings were noted as most consistent with malignant brain tumor, (either primary or metastatic). CT of the chest abdomen and pelvis also performed on 10/17 revealed 2 small indeterminate sclerotic osseous lesions about the thoracic spine and left left iliac wing.   Accordingly, the patient underwent a craniotomy and biopsy of the right brain on 11/25/20 under the care of Dr. Zada Finders which revealed:  WHO grade 4 glioblastoma, IDH-wild-type.   Post-op MRI on 11/26/20 showed gross total resection of the lesion without concerning findings. The patient was noted to be doing well from a post-operative standpoint, and did not have any further partial seizures or neurologic abnormalities after starting  keppra. Overall, her hospital course was uncomplicated and the patient was discharged home on 11/26/20.   Unfortunately, the patient then presented to the ED on 12/04/20 with worsening left-sided weakness and was admitted. CT of the head on 10/28 showed an increase in marked edema surrounding the operative site, as well as mass effect with effacement of the sulci.  MRI performed on 10/28 again demonstrated progressive surrounding edema and mass effect with 3 mm right to left mid-line shift. MRI also showed progressive enlargement of a surgical cavity with peripheral enhancement. Given the rapid change in thick peripheral enhancement, this was noted as most concerning for infection   Patient was accordingly treated with IV decadron. Her left sided weakness slowly improved and she was discharged home on 12/09/20.   On 12/17/20, the patient was referred to Dr. Mickeal Skinner for further evaluation. Following discussion of her condition, the patient was recommended to proceed with treatment consisting of intensity modulated radiation therapy and concurrent daily Temozolomide.  Additional MRI was performed on 12/24/20, and I have personally reviewed these images as well which demonstrate slight interval decrease in the size of the resection cavity with ongoing evolution of internal blood products.  There is a focus of nodular enhancement at the inferior margin of the cavity that has increased in conspicuity and may be consistent with postsurgical changes versus tumor.   Evolution of FLAIR signal abnormality noted.  She is taking 4 mg of dexamethasone daily with good symptom control.  She has some balance issues that are mild and occasional mild headaches.  She denies previous radiation.  She is here with her husband who is very supportive.  PREVIOUS RADIATION THERAPY: No  PAST MEDICAL HISTORY:  has a past medical history of Cancer ([place]), Hypertension, Vertigo, and Vestibular neuritis.    PAST SURGICAL  HISTORY: Past Surgical History:  Procedure Laterality Date   APPLICATION OF CRANIAL NAVIGATION Right 11/25/2020   Procedure: APPLICATION OF CRANIAL NAVIGATION;  Surgeon: Judith Part, MD;  Location: [facility];  Service: Neurosurgery;  Laterality: Right;   CHOLECYSTECTOMY  2017   CRANIOTOMY Right 11/25/2020   Procedure: CRANIOTOMY FOR  TUMOR RESECTION WITH Lucky Rathke;  Surgeon: Judith Part, MD;  Location: [facility];  Service: Neurosurgery;  Laterality: Right;   laproscopy     surgery for endometriosisi   OOPHORECTOMY  1974&1990   rt then left ovary    FAMILY HISTORY: family history includes Aneurysm in her father; Atrial fibrillation in her brother; Heart disease in her sister; Lung cancer in her mother.  SOCIAL HISTORY:  reports that she quit smoking about 24 years ago. Her smoking use included cigarettes. She has never used smokeless tobacco. She reports that she does not currently use alcohol after a past usage of about 2.0 standard drinks per week. She reports that she does not use drugs.  ALLERGIES: Epinephrine, Azithromycin, and Motrin [ibuprofen]  MEDICATIONS:  Current Outpatient Medications  Medication Sig Dispense Refill   acetaminophen (TYLENOL) 500 MG tablet Take 1,000 mg by mouth every 6 (six) hours as needed for moderate pain.     ALPRAZolam (XANAX) 0.25 MG tablet Take 4.5 tablets (1.125 mg total) by mouth daily as needed (Vestibular neuritis). Patient states she takes 4 of the 0.25 mg tablets and then splits one 0.25 mg tablet to make total of 1.125 mg daily for vestibular neuritis (Patient not taking: Reported on 12/17/2020)     dexamethasone (DECADRON) 4 MG tablet Take 1 tablet (4 mg total) by mouth 2 (two) times daily with a meal. 60 tablet 2   diazepam (VALIUM) 5 MG tablet Take 5 mg by mouth daily as needed. For MRI     levETIRAcetam (KEPPRA) 500 MG tablet Take 1 tablet (500 mg total) by mouth 2 (two) times daily. 60 tablet 5   metoprolol tartrate (LOPRESSOR) 25 MG  tablet Take 0.5 tablets (12.5 mg total) by mouth 2 (two) times daily. 60 tablet 0   ondansetron (ZOFRAN) 8 MG tablet Take 1 tablet (8 mg total) by mouth 2 (two) times daily as needed (nausea and vomiting). May take 30-60 minutes prior to Temodar administration if nausea/vomiting occurs. 30 tablet 1   telmisartan (MICARDIS) 40 MG tablet Take 40 mg by mouth daily.     temozolomide (TEMODAR) 140 MG capsule Take 1 capsule (140 mg total) by mouth daily. May take on an empty stomach to decrease nausea & vomiting. 42 capsule 0   No current facility-administered medications for this encounter.    REVIEW OF SYSTEMS:  Notable for that above.   PHYSICAL EXAM:  height is $RemoveB'5\' 7"'vlMxbVTn$  (1.702 m) and weight is 169 lb (76.7 kg). Her temperature is 97.6 F (36.4 C). Her blood pressure is 147/68 (abnormal) and her pulse is 61. Her respiration is 20 and oxygen saturation is 99%.   General: Alert and oriented, in no acute distress  HEENT: Head is normocephalic.  Satisfactory healing of right frontal craniotomy site over scalp.  Extraocular movements are intact. Oropharynx is clear. Neck: Neck is supple, no palpable cervical or supraclavicular lymphadenopathy. Heart: Regular in rate and rhythm with no murmurs, rubs, or gallops. Chest: Clear to auscultation bilaterally, with no rhonchi,  wheezes, or rales. Abdomen: Soft, nontender, nondistended, with no rigidity or guarding. Extremities: No cyanosis or edema. Lymphatics: see Neck Exam Skin: No concerning lesions. Musculoskeletal: Mild left-sided weakness noted in left hand, left arm, and left hip Neurologic: Cranial nerves II through XII are grossly intact. No obvious focalities. Speech is fluent. Coordination is intact.  Object recall is 3 out of 3 at 3 minutes Psychiatric: Judgment and insight are intact. Affect is appropriate.  KPS =  90  100 - Normal; no complaints; no evidence of disease. 90   - Able to carry on normal activity; minor signs or symptoms of  disease. 80   - Normal activity with effort; some signs or symptoms of disease. 44   - Cares for self; unable to carry on normal activity or to do active work. 60   - Requires occasional assistance, but is able to care for most of his personal needs. 50   - Requires considerable assistance and frequent medical care. 28   - Disabled; requires special care and assistance. 29   - Severely disabled; hospital admission is indicated although death not imminent. 57   - Very sick; hospital admission necessary; active supportive treatment necessary. 10   - Moribund; fatal processes progressing rapidly. 0     - Dead  Karnofsky DA, Abelmann [place], Craver LS and [place] [phone]) The use of the nitrogen mustards in the palliative treatment of carcinoma: with particular reference to bronchogenic carcinoma Cancer 1 [id number]    LABORATORY DATA:  Lab Results  Component Value Date   WBC 5.9 12/05/2020   HGB 14.5 12/05/2020   HCT 42.3 12/05/2020   MCV 92.6 12/05/2020   PLT 260 12/05/2020   CMP     Component Value Date/Time   NA 138 12/05/2020 0507   K 3.8 12/05/2020 0507   CL 105 12/05/2020 0507   CO2 22 12/05/2020 0507   GLUCOSE 114 (H) 12/05/2020 0507   BUN 9 12/05/2020 0507   CREATININE 0.67 12/05/2020 0507   CALCIUM 9.7 12/05/2020 0507   PROT 6.7 12/05/2020 0507   ALBUMIN 3.8 12/05/2020 0507   AST 15 12/05/2020 0507   ALT 20 12/05/2020 0507   ALKPHOS 58 12/05/2020 0507   BILITOT 1.0 12/05/2020 0507   GFRNONAA >60 12/05/2020 0507         RADIOGRAPHY: CT HEAD WO CONTRAST (5MM)  Result Date: 12/04/2020 CLINICAL DATA:  Neuro deficit, acute, stroke suspected. Status post tumor removal. Left-sided weakness and facial droop. EXAM: CT HEAD WITHOUT CONTRAST TECHNIQUE: Contiguous axial images were obtained from the base of the skull through the vertex without intravenous contrast. COMPARISON:  CT head without contrast 11/23/2020. MR head without and with contrast 11/26/2020. FINDINGS: Brain:  Patient is status post right frontal craniotomy. Gas is present surgical cavity. Marked edema surrounds the operative site. The extent of surrounding vasogenic edema has increased. There is now mass effect with effacement of the sulci. No significant midline shift is present. Minimal blood products are again noted within the operative cavity. No significant new hemorrhage is present. The ventricles are of normal size. No significant extra-axial fluid collection is present. The brainstem and cerebellum are within normal limits. Vascular: No hyperdense vessel or unexpected calcification. Skull: Right frontal craniotomy.  Calvarium is otherwise intact. Sinuses/Orbits: The paranasal sinuses and mastoid air cells are clear. The globes and orbits are within normal limits. IMPRESSION: 1. Status post right frontal craniotomy for tumor resection. 2. Marked edema surrounding the operative site has increased. 3.  There is now mass effect with effacement of the sulci. 4. No significant midline shift. 5. Minimal blood products are again noted within the operative cavity. 6. No significant new hemorrhage. These results were called by telephone at the time of interpretation on 12/04/2020 at 6:04 pm to provider [facility] , who verbally acknowledged these results. Electronically Signed   By: San Morelle M.D.   On: 12/04/2020 18:04   MR Brain W Wo Contrast  Result Date: 12/25/2020 CLINICAL DATA:  Brain tumor resection 11/25/2020 EXAM: MRI HEAD WITHOUT AND WITH CONTRAST TECHNIQUE: Multiplanar, multiecho pulse sequences of the brain and surrounding structures were obtained without and with intravenous contrast. CONTRAST:  5mL MULTIHANCE GADOBENATE DIMEGLUMINE 529 MG/ML IV SOLN COMPARISON:  MR/CT head 12/04/2020. FINDINGS: Brain: Postsurgical changes reflecting right frontoparietal craniotomy for mass resection are again seen. There is been interval evolution of the blood products within the resection cavity. The  cavity has slightly decreased in size since 12/04/2020. SWI signal dropout within and at the periphery of the cavity are consistent with postsurgical blood products. There is patchy diffusion restriction within portions of the resection cavity. There is irregular curvilinear enhancement at the periphery of the cavity. Specifically, there is a 4 mm focus of nodular enhancement along the inferior margin of the cavity which is increased in conspicuity (17-113). Otherwise, the enhancement is overall similar in extent compared to the study from 12/04/2020 allowing for interval collapse of the cavity, but increased compared to the immediate postoperative study dated 11/26/2020. FLAIR signal abnormality surrounding the resection cavity has significantly improved since 12/04/2020. There is associated decreased sulcal effacement and re-expansion of the right lateral ventricle. Dural thickening underlying the craniotomy site is likely postoperative. There is nonenhancing FLAIR signal abnormality in the left thalamus which is not significantly changed compared to the initial study from 11/23/2020. Additionally, there is nonenhancing FLAIR signal abnormality extending inferiorly from the resection site in the right frontal lobe. This appears increased in conspicuity compared to the study from 11/23/2020. This lesion was likely masked by significant surrounding edema on the study from 12/04/2020. There is no midline shift. Vascular: Normal flow voids. Skull and upper cervical spine: There is no marrow signal abnormality. As above, status post right frontoparietal craniotomy. Sinuses/Orbits: The paranasal sinuses are clear. The globes and orbits are unremarkable. Other: None. IMPRESSION: 1. Postsurgical changes reflecting right frontal lobe mass resection. There has been slight interval decrease in size of the resection cavity with ongoing evolution of internal blood products. 2. A focus of nodular enhancement along the inferior  margin of the cavity is increased in conspicuity since 12/04/2020. Otherwise, the curvilinear enhancement at the periphery of the cavity is grossly similar to the prior study from 12/04/2020 allowing for interval collapse of the cavity, but increased since the immediate postoperative study from 11/26/2020. Findings may reflect evolving postsurgical change; however, residual tumor cannot be entirely excluded. Continued attention on follow-up. 3. Significant interval decrease in extent of FLAIR signal abnormality/edema surrounding the resection cavity with decreased sulcal effacement and re-expansion of the right lateral ventricle. Midline shift has resolved. 4. Nonenhancing FLAIR signal abnormality extending inferiorly from the resection margin in the right frontal lobe described above is increased in conspicuity compared to the preoperative study. Infiltrative tumor cannot be excluded. Nonspecific FLAIR signal abnormality in the left thalamus is unchanged compared to the initial study from 11/23/2020. Continued attention on follow-up. Electronically Signed   By: Valetta Mole M.D.   On: 12/25/2020 08:55   MR BRAIN  W WO CONTRAST  Result Date: 12/04/2020 CLINICAL DATA:  Progressive left upper and lower extremity weakness. Status post resection of brain tumor. EXAM: MRI HEAD WITHOUT AND WITH CONTRAST TECHNIQUE: Multiplanar, multiecho pulse sequences of the brain and surrounding structures were obtained without and with intravenous contrast. CONTRAST:  7.42mL GADAVIST GADOBUTROL 1 MMOL/ML IV SOLN COMPARISON:  CT head without contrast 12/04/2020. MR head without and with contrast 11/26/2020 and 11/23/2020. FINDINGS: Brain: The surgical cavity has expanded. Peripheral enhancement is noted. The cavity measures 3.0 x 3.5 x 3.6 cm. Marked surrounding edema has progressed, as seen on the previous CT scan. This results in effacement of the sulci and partial effacement the right lateral ventricle. Midline shift is 3 mm right  to left. Progressive dural enhancement is reactive over the right hemisphere. No distal enhancement is present. Cortical and subcortical edema is present along the medial left frontal lobe. 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 The brainstem and cerebellum are otherwise unremarkable. The internal auditory canals are within normal limits. Vascular: Flow is present in the major intracranial arteries. Skull and upper cervical spine: The craniocervical junction is normal. Upper cervical spine is within normal limits. Marrow signal is unremarkable. Sinuses/Orbits: The paranasal sinuses and mastoid air cells are clear. The globes and orbits are within normal limits. IMPRESSION: 1. Progressive enlargement of a surgical cavity with peripheral enhancement. Given the rapid change in thick peripheral enhancement, this is most concerning for infection. 2. Stable blood products. 3. No evidence for significant tumor. 4. Progressive surrounding edema and mass effect with 3 mm right to left midline shift. 5. Stable cortical and subcortical edema in the medial left frontal lobe. 6. 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 These results were called by telephone at the time of interpretation on 12/04/2020 at 9:16 pm to provider [facility], who verbally acknowledged these results. Electronically Signed   By: San Morelle M.D.   On: 12/04/2020 21:17   MR BRAIN W WO CONTRAST  Result Date: 11/26/2020 CLINICAL DATA:  CNS neoplasm, status post craniotomy for tumor resection EXAM: MRI HEAD WITHOUT AND WITH CONTRAST TECHNIQUE: Multiplanar, multiecho pulse sequences of the brain and surrounding structures were obtained without and with intravenous contrast. CONTRAST:  73mL GADAVIST GADOBUTROL 1 MMOL/ML IV SOLN COMPARISON:  11/23/2020 FINDINGS: Brain: Status post interval right frontal craniotomy with subjacent resection cavity in the right frontal lobe, which is filled with  fluid and air. Some T1 hyperintense material around the periphery, likely postoperative blood products. Additional enhancement at the superior aspect of the resection cavity (series 18, image 40), may be postoperative or could represent residual tumor. Small fluid collection subjacent to the craniotomy flap, measuring to 7 mm, with air anterior to the right frontal lobe, not unexpected postoperatively. Slightly decreased surrounding T2 hyperintense signal in the right frontal lobe. Restricted diffusion about the resection cavity is not unexpected postoperatively. No midline shift. No hydrocephalus. Vascular: Normal flow voids. Skull and upper cervical spine: Status post right frontal craniotomy. Otherwise normal marrow signal. Sinuses/Orbits: Negative. Other: Fluid in the subcutaneous tissues overlying the craniotomy flap, not unexpected postoperatively. IMPRESSION: Expected postoperative appearance, status post right frontal craniotomy and resection of subjacent mass. Minimal enhancement along the superior aspect of the resection cavity may be postoperative or could represent residual tumor. Attention on follow-up. Electronically Signed   By: Merilyn Baba M.D.   On: 11/26/2020 01:33      IMPRESSION/PLAN:This is a very pleasant 65 y.o. female with  glioblastoma of right frontal lobe   Today, I talked to the patient about the findings and work-up thus far.  We discussed the patient's diagnosis of glioblastoma and general treatment for this, highlighting the role of radiotherapy in the management.  We discussed the available radiation techniques, and focused on the details of logistics and delivery.   I recommend 6 weeks of adjuvant radiation therapy to the partial brain.  She does receive concurrent temozolomide.  The goal is aggressive local control with curative intent but I explained to the patient that cure is rare with her type of disease.  She has good insight and understanding of her disease.  We  discussed the risks, benefits, and side effects of radiotherapy. Side effects may include but not necessarily be limited to: Skin irritation, permanent hair loss, fatigue, headaches, neurologic decline, cognitive decline, brain injury; no guarantees of treatment were given. A consent form was signed and placed in the patient's medical record. The patient was encouraged to ask questions that I answered to the best of my ability.  We will proceed with treatment planning today.  On date of service, in total, I spent 50 minutes on this encounter. Patient was seen in person.   __________________________________________   Eppie Gibson, MD  This document serves as a record of services personally performed by Eppie Gibson, MD. It was created on her behalf by Roney Mans, a trained medical scribe. The creation of this record is based on the scribe's personal observations and the provider's statements to them. This document has been checked and approved by the attending provider.

## 2020-12-24 NOTE — Progress Notes (Signed)
Location/Histology of Brain Tumor:  FINAL MICROSCOPIC DIAGNOSIS:  A. BRAIN, RIGHT, BIOPSY:  -  Glioblastoma, IDH-wild-type, WHO grade 4   Patient presented with symptoms of:  presented on 11/23/20 with several episodes of sudden dysfunction of left arm and leg, lasting several minutes, with most recent episode accompanied by frank speech arrest while at work.   MRI Brain w/ & w/o Contrast  12/24/2020 --IMPRESSION: Postsurgical changes reflecting right frontal lobe mass resection. There has been slight interval decrease in size of the resection cavity with ongoing evolution of internal blood products. A focus of nodular enhancement along the inferior margin of the cavity is increased in conspicuity since 12/04/2020. Otherwise, the curvilinear enhancement at the periphery of the cavity is grossly similar to the prior study from 12/04/2020 allowing for interval collapse of the cavity, but increased since the immediate postoperative study from 11/26/2020. Findings may reflect evolving postsurgical change; however, residual tumor cannot be entirely excluded. Continued attention on follow-up. Significant interval decrease in extent of FLAIR signal abnormality/edema surrounding the resection cavity with decreased sulcal effacement and re-expansion of the right lateral ventricle. Midline shift has resolved. Nonenhancing FLAIR signal abnormality extending inferiorly from the resection margin in the right frontal lobe described above is increased in conspicuity compared to the preoperative study. Infiltrative tumor cannot be excluded. Nonspecific FLAIR signal abnormality in the left thalamus is unchanged compared to the initial study from 11/23/2020. Continued attention on follow-up.  MRI Brain w/ & w/o Contrast  12/04/2020 --IMPRESSION: Progressive enlargement of a surgical cavity with peripheral enhancement. Given the rapid change in thick peripheral enhancement, this is most concerning for  infection. Stable blood products. No evidence for significant tumor. Progressive surrounding edema and mass effect with 3 mm right to left midline shift. Stable cortical and subcortical edema in the medial left frontal lobe. 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  Past or anticipated interventions, if any, per neurosurgery:  11/25/2020 --Dr. Emelda Brothers Right craniotomy for tumor resection  use of operating microscope and intra-operative frameless stereotactic navigation  Past or anticipated interventions, if any, per medical oncology:  Under care of Dr. Cecil Cobbs 12/17/2020 We ultimately recommended proceeding with course of intensity modulated radiation therapy and concurrent daily Temozolomide.   Radiation will be administered Mon-Fri over 6 weeks, Temodar will be dosed at 49m/m2 to be given daily over 42 days.  Every 2 weeks during radiation, labs will be checked accompanied by a clinical evaluation in the brain tumor clinic. For recent clinical decompensation with inflammatory findings on MRI; we will follow up with repeat MRI brain prior to radiation to generate accurate dosimetry/field. Decadron should be decreased to 4347mdaily, can reassess with radiation oncology on 11/18 following MRI.   Could drop further to 47m10mt that time. We also discussed and patient consented for additional tumor profiling and sequencing through [facility] tumor profiling could help identify actionable mutation for targeted therapy and lead to direct clinical benefit  Dose of Decadron, if applicable: 4 mg PO daily   Recent neurologic symptoms, if any:  Seizures: Denies any incidences since her surgery in October  Headaches: Reports occasional mild headache when she first wakes up (in left temporal area); Denies needing OTC pain medication and states it'll resolve on its own Nausea: Patient denies--reports appetite has started to improve  Dizziness/ataxia: States  she'll feel off balance if she has been up doing activities for awhile; will typically need to stop and rest, and symptoms resolve Difficulty with hand  coordination: Denies any issues since she's been taking oral steroid Focal numbness/weakness: Patient denies Visual deficits/changes: Patient denies any new or worsening issues Confusion/Memory deficits: Denies any current issues, but was experiencing confusion/memory difficulties after surgery   SAFETY ISSUES: Prior radiation? No Pacemaker/ICD? No Possible current pregnancy? No--postmenopausal Is the patient on methotrexate? No  Additional Complaints / other details: Nothing else of note

## 2020-12-24 NOTE — Telephone Encounter (Signed)
Oral Oncology Pharmacist Encounter  Received new prescription for Temodar (temozolomide) for the treatment of glioblastoma in conjunction with radiation, planned duration 42 days.  CBC and CMP from 12/05/20 assessed, no relevant lab abnormalities noted. Prescription dose and frequency assessed for appropriateness.  Current medication list in Epic reviewed, no relevant/significant DDIs with Temodar identified.  Evaluated chart and no patient barriers to medication adherence noted.   Patient agreement for treatment documented in MD note on 12/17/20.  Prescription has been e-scribed to the [facility] for benefits analysis and approval.  Oral Oncology Clinic will continue to follow for insurance authorization, copayment issues, initial counseling and start date.  Leron Croak, PharmD, BCPS Hematology/Oncology Clinical Pharmacist [facility] and [place] [phone] 12/24/2020 4:01 PM

## 2020-12-24 NOTE — Progress Notes (Signed)

## 2020-12-25 ENCOUNTER — Ambulatory Visit
Admission: RE | Admit: 2020-12-25 | Discharge: 2020-12-25 | Disposition: A | Discharge status: Home / Self Care | Payer: BC Managed Care – PPO | Source: Ambulatory Visit | Attending: Radiation Oncology | Admitting: Radiation Oncology

## 2020-12-25 ENCOUNTER — Encounter: Payer: Self-pay | Admitting: Emergency Medicine

## 2020-12-25 ENCOUNTER — Other Ambulatory Visit (HOSPITAL_COMMUNITY): Payer: Self-pay

## 2020-12-25 ENCOUNTER — Encounter: Payer: Self-pay | Admitting: Radiation Oncology

## 2020-12-25 ENCOUNTER — Other Ambulatory Visit: Payer: BC Managed Care – PPO

## 2020-12-25 VITALS — BP 147/68 | HR 61 | Temp 97.6°F | Resp 20 | Ht 67.0 in | Wt 169.0 lb

## 2020-12-25 DIAGNOSIS — Z510 Encounter for antineoplastic radiation therapy: Secondary | ICD-10-CM | POA: Insufficient documentation

## 2020-12-25 DIAGNOSIS — R519 Headache, unspecified: Secondary | ICD-10-CM | POA: Insufficient documentation

## 2020-12-25 DIAGNOSIS — C711 Malignant neoplasm of frontal lobe: Secondary | ICD-10-CM

## 2020-12-25 DIAGNOSIS — R29810 Facial weakness: Secondary | ICD-10-CM | POA: Insufficient documentation

## 2020-12-25 DIAGNOSIS — R609 Edema, unspecified: Secondary | ICD-10-CM | POA: Insufficient documentation

## 2020-12-25 DIAGNOSIS — I10 Essential (primary) hypertension: Secondary | ICD-10-CM | POA: Insufficient documentation

## 2020-12-25 DIAGNOSIS — Z79899 Other long term (current) drug therapy: Secondary | ICD-10-CM | POA: Diagnosis not present

## 2020-12-25 DIAGNOSIS — C719 Malignant neoplasm of brain, unspecified: Secondary | ICD-10-CM

## 2020-12-25 NOTE — Research (Signed)
DCP-001: Use of a Clinical Trial Screening Tool to Address Cancer Health Disparities in the [facility])  Briefly introduced study to patient and provided consent forms to take home and review.  Will f/u with patient at next Radiation appt to determine interest/sign consent.  Provided contact info, pt aware to contact as needed before then with any questions/concerns.  Wells Guiles 'Learta CoddingNeysa Bonito, RN, BSN Clinical Research Nurse I 12/25/20 10:04 AM

## 2020-12-25 NOTE — Telephone Encounter (Addendum)
Oral Oncology Pharmacist Encounter  Prior Authorization for Temodar has been approved.    PA# [id number] Effective dates: 12/24/20 through 12/23/21  Patients co-pay is $0  Oral Oncology Clinic will continue to follow.   Leron Croak, PharmD, BCPS Hematology/Oncology Clinical Pharmacist [facility] [phone] 12/25/2020 11:59 AM

## 2020-12-28 ENCOUNTER — Other Ambulatory Visit (HOSPITAL_COMMUNITY): Payer: Self-pay

## 2020-12-28 NOTE — Telephone Encounter (Signed)
Oral Chemotherapy Pharmacist Encounter  I spoke with patient for overview of: Temodar for the treatment of glioblastoma multiforme in conjunction with radiation, planned duration concomitant phase 42 days of therapy.   Counseled patient on administration, dosing, side effects, monitoring, drug-food interactions, safe handling, storage, and disposal.  Patient will take Temodar 140mg  capsules, 1 capsule, 140 mg total daily dose, by mouth once daily, may take at bedtime and on an empty stomach to decrease nausea and vomiting.  Patient will take Temodar concurrent with radiation for 42 days straight.  Temodar start date: 01/05/21 PM Radiation start date: 01/06/21   Patient will take Zofran 8mg  tablet, 1 tablet by mouth 30-60 min prior to Temodar dose to help decrease N/V once starting adjuvant therapy. Prophylactic Zofran will not be used at initiation of concurrent phase, but will be initiated if nausea develops despite Temodar administration on an empty stomach and at bedtime.   Adverse effects include but are not limited to: nausea, vomiting, anorexia, GI upset, rash, drug fever, and fatigue. Rare but serious adverse effects of pneumocystis pneumonia and secondary malignancy also discussed.  PCP prophylaxis will not be initiated at this time, but may be added based on lymphocyte count in the future.  Reviewed with patient importance of keeping a medication schedule and plan for any missed doses. No barriers to medication adherence identified.  Medication reconciliation performed and medication/allergy list updated.  Insurance authorization for Temodar has been obtained. Test claim at the pharmacy revealed copayment $0 for 1st fill of Temodar.  Patient informed the pharmacy will reach out 5-7 days prior to needing next fill of Temodar to coordinate continued medication acquisition to prevent break in therapy.   All questions answered.  Ms. Devita voiced understanding and appreciation.    Medication education handout placed in mail for patient. Patient knows to call the office with questions or concerns. Oral Chemotherapy Clinic phone number provided to patient.   Leron Croak, PharmD, BCPS Hematology/Oncology Clinical Pharmacist [facility] and [place] [phone] 12/28/2020 11:41 AM

## 2020-12-29 ENCOUNTER — Encounter: Payer: Self-pay | Admitting: *Deleted

## 2020-12-29 NOTE — Progress Notes (Signed)
[place] CSW Psychosocial Distress Screening  Social Work was referred by distress screening protocol.  The patient scored a 7 on the Psychosocial Distress Thermometer which indicates moderate distress. Social Work Theatre manager contacted patient by phone to assess for distress and other psychosocial needs.  Patient reports feeling better after talking to Dr. Isidore Moos, that she is clear about next steps for her treatment and is trying to focus on a positive outcome. She says her husband has faxed insurance info to the nurse and they are working to get clear about coverage.  Patient denies any resource/ financial needs at this time and stated that she is not working but her husband works full time.  Intern informed patient of the support team roles and support services at [facility]; Provided CSW contact information and encouraged patient to call with any future questions or concerns.  ONCBCN DISTRESS SCREENING 12/25/2020  Screening Type Initial Screening  Distress experienced in past week (1-10) 7  Practical problem type Insurance  Emotional problem type Adjusting to illness  Physician notified of physical symptoms Yes  Referral to clinical psychology No  Referral to clinical social work Yes  Referral to dietition No  Referral to financial advocate Yes  Referral to support programs Yes  Referral to palliative care No    Rosary Lively, Social Work Intern Supervised by Gwinda Maine, LCSW

## 2021-01-04 DIAGNOSIS — Z510 Encounter for antineoplastic radiation therapy: Secondary | ICD-10-CM | POA: Diagnosis not present

## 2021-01-04 DIAGNOSIS — C711 Malignant neoplasm of frontal lobe: Secondary | ICD-10-CM | POA: Diagnosis not present

## 2021-01-06 ENCOUNTER — Ambulatory Visit
Admission: RE | Admit: 2021-01-06 | Discharge: 2021-01-06 | Disposition: A | Discharge status: Home / Self Care | Payer: BC Managed Care – PPO | Source: Ambulatory Visit | Attending: Radiation Oncology | Admitting: Radiation Oncology

## 2021-01-06 ENCOUNTER — Inpatient Hospital Stay: Payer: BC Managed Care – PPO | Admitting: Emergency Medicine

## 2021-01-06 ENCOUNTER — Other Ambulatory Visit: Payer: Self-pay

## 2021-01-06 DIAGNOSIS — C719 Malignant neoplasm of brain, unspecified: Secondary | ICD-10-CM

## 2021-01-06 DIAGNOSIS — Z510 Encounter for antineoplastic radiation therapy: Secondary | ICD-10-CM | POA: Diagnosis not present

## 2021-01-06 DIAGNOSIS — C711 Malignant neoplasm of frontal lobe: Secondary | ICD-10-CM | POA: Diagnosis not present

## 2021-01-06 NOTE — Research (Signed)
DCP-001: Use of a Clinical Trial Screening Tool to Address Cancer Health Disparities in the [place] [facility])  Patient Erika Mitchell was identified by this Clinical Research Nurse as a potential candidate for the above listed study.  This Clinical Research Nurse met with BRICIA TAHER, [id number] on 01/06/21 in a manner and location that ensures patient privacy to discuss participation in the above listed research study.  Patient is Accompanied by spouse  Joe.  Patient was previously provided with informed consent documents.  Patient confirmed they have read the informed consent documents.  As outlined in the informed consent form, this Nurse and Benedict Needy discussed the purpose of the research study, the investigational nature of the study, study procedures and requirements for study participation, potential risks and benefits of study participation, as well as alternatives to participation.  This study is not blinded or double-blinded. The patient understands participation is voluntary and they may withdraw from study participation at any time.  This study does not involve randomization.  This study does not involve an investigational drug or device. This study does not involve a placebo. Patient understands enrollment is pending full eligibility review.   Confidentiality and how the patient's information will be used as part of study participation were discussed.  Patient was informed there is not reimbursement provided for their time and effort spent on trial participation.  The patient is encouraged to discuss research study participation with their insurance provider to determine what costs they may incur as part of study participation, including research related injury.    All questions were answered to patient's satisfaction.  The informed consent and separate HIPAA Authorization was reviewed page by page.  The patient's mental and emotional status is  appropriate to provide informed consent, and the patient verbalizes an understanding of study participation.  Patient has agreed to participate in the above listed research study and has voluntarily signed the informed consent version date 07/31/2018 ([facility] version date 11/06/2020) and separate HIPAA Authorization, version date 01/23/2019 ([facility] version date 12/02/2020)  on 01/06/21 at 1:52 PM.  The patient was provided with a copy of the signed informed consent form and separate HIPAA Authorization for their reference.  No study specific procedures were obtained prior to the signing of the informed consent document.  Approximately 30 minutes were spent with the patient reviewing the informed consent documents.  Patient was not requested to complete a Release of Information form.  Wells Guiles 'Learta CoddingNeysa Bonito, RN, BSN Clinical Research Nurse I 01/06/21 2:09 PM

## 2021-01-07 ENCOUNTER — Ambulatory Visit
Admission: RE | Admit: 2021-01-07 | Discharge: 2021-01-07 | Disposition: A | Discharge status: Home / Self Care | Payer: BC Managed Care – PPO | Source: Ambulatory Visit | Attending: Radiation Oncology | Admitting: Radiation Oncology

## 2021-01-07 DIAGNOSIS — Z85828 Personal history of other malignant neoplasm of skin: Secondary | ICD-10-CM | POA: Diagnosis not present

## 2021-01-07 DIAGNOSIS — Z79899 Other long term (current) drug therapy: Secondary | ICD-10-CM | POA: Diagnosis not present

## 2021-01-07 DIAGNOSIS — Z87891 Personal history of nicotine dependence: Secondary | ICD-10-CM | POA: Diagnosis not present

## 2021-01-07 DIAGNOSIS — R569 Unspecified convulsions: Secondary | ICD-10-CM | POA: Diagnosis not present

## 2021-01-07 DIAGNOSIS — C711 Malignant neoplasm of frontal lobe: Secondary | ICD-10-CM | POA: Insufficient documentation

## 2021-01-08 ENCOUNTER — Other Ambulatory Visit: Payer: Self-pay

## 2021-01-08 ENCOUNTER — Ambulatory Visit
Admission: RE | Admit: 2021-01-08 | Discharge: 2021-01-08 | Disposition: A | Discharge status: Home / Self Care | Payer: BC Managed Care – PPO | Source: Ambulatory Visit | Attending: Radiation Oncology | Admitting: Radiation Oncology

## 2021-01-08 DIAGNOSIS — R569 Unspecified convulsions: Secondary | ICD-10-CM | POA: Diagnosis not present

## 2021-01-08 DIAGNOSIS — Z85828 Personal history of other malignant neoplasm of skin: Secondary | ICD-10-CM | POA: Diagnosis not present

## 2021-01-08 DIAGNOSIS — Z79899 Other long term (current) drug therapy: Secondary | ICD-10-CM | POA: Diagnosis not present

## 2021-01-08 DIAGNOSIS — C711 Malignant neoplasm of frontal lobe: Secondary | ICD-10-CM | POA: Diagnosis not present

## 2021-01-08 DIAGNOSIS — Z87891 Personal history of nicotine dependence: Secondary | ICD-10-CM | POA: Diagnosis not present

## 2021-01-11 ENCOUNTER — Encounter: Payer: Self-pay | Admitting: *Deleted

## 2021-01-11 ENCOUNTER — Other Ambulatory Visit: Payer: Self-pay

## 2021-01-11 ENCOUNTER — Ambulatory Visit
Admission: RE | Admit: 2021-01-11 | Discharge: 2021-01-11 | Disposition: A | Discharge status: Home / Self Care | Payer: BC Managed Care – PPO | Source: Ambulatory Visit | Attending: Radiation Oncology | Admitting: Radiation Oncology

## 2021-01-11 DIAGNOSIS — Z79899 Other long term (current) drug therapy: Secondary | ICD-10-CM | POA: Diagnosis not present

## 2021-01-11 DIAGNOSIS — C711 Malignant neoplasm of frontal lobe: Secondary | ICD-10-CM

## 2021-01-11 DIAGNOSIS — Z87891 Personal history of nicotine dependence: Secondary | ICD-10-CM | POA: Diagnosis not present

## 2021-01-11 DIAGNOSIS — R569 Unspecified convulsions: Secondary | ICD-10-CM | POA: Diagnosis not present

## 2021-01-11 DIAGNOSIS — Z85828 Personal history of other malignant neoplasm of skin: Secondary | ICD-10-CM | POA: Diagnosis not present

## 2021-01-11 MED ORDER — SONAFINE EX EMUL
1.0000 "application " | Freq: Two times a day (BID) | CUTANEOUS | Status: DC
  Administered 2021-01-11: 1 via TOPICAL

## 2021-01-11 NOTE — Progress Notes (Signed)
[place] Work  Clinical Social Work was referred by Insurance underwriter for assessment of psychosocial needs.  Clinical Social Worker contacted patient by phone  to offer support and assess for needs.    Patient had questions regarding long term disability and social security disability.  Erika Mitchell recently stopped working due to cancer treatment.  She recently filed for [place] discussed process for social security and patient's diagnosis as a "compassionate allowance" diagnosis.    CSW reviewed options and encouraged patient to call with any questions or concerns.  Gwinda Maine, LCSW  Clinical Social Worker [facility]

## 2021-01-11 NOTE — Progress Notes (Signed)
Pt here for patient teaching.    Pt given Radiation and You booklet, skin care instructions, and Sonafine.    Reviewed areas of pertinence such as fatigue, hair loss, nausea and vomiting, skin changes, headache, and blurry vision .   Pt able to give teach back of to pat skin, use unscented/gentle soap, and drink plenty of water,apply Sonafine bid and avoid applying anything to skin within 4 hours of treatment.   Pt demonstrated understanding and verbalizes understanding of information given and will contact nursing with any questions or concerns.    Http://rtanswers.org/treatmentinformation/whattoexpect/index

## 2021-01-12 ENCOUNTER — Ambulatory Visit
Admission: RE | Admit: 2021-01-12 | Discharge: 2021-01-12 | Disposition: A | Discharge status: Home / Self Care | Payer: BC Managed Care – PPO | Source: Ambulatory Visit | Attending: Radiation Oncology | Admitting: Radiation Oncology

## 2021-01-12 DIAGNOSIS — C711 Malignant neoplasm of frontal lobe: Secondary | ICD-10-CM | POA: Diagnosis not present

## 2021-01-12 DIAGNOSIS — R569 Unspecified convulsions: Secondary | ICD-10-CM | POA: Diagnosis not present

## 2021-01-12 DIAGNOSIS — Z79899 Other long term (current) drug therapy: Secondary | ICD-10-CM | POA: Diagnosis not present

## 2021-01-12 DIAGNOSIS — Z85828 Personal history of other malignant neoplasm of skin: Secondary | ICD-10-CM | POA: Diagnosis not present

## 2021-01-12 DIAGNOSIS — Z87891 Personal history of nicotine dependence: Secondary | ICD-10-CM | POA: Diagnosis not present

## 2021-01-13 ENCOUNTER — Ambulatory Visit
Admission: RE | Admit: 2021-01-13 | Discharge: 2021-01-13 | Disposition: A | Discharge status: Home / Self Care | Payer: BC Managed Care – PPO | Source: Ambulatory Visit | Attending: Radiation Oncology | Admitting: Radiation Oncology

## 2021-01-13 ENCOUNTER — Other Ambulatory Visit: Payer: Self-pay

## 2021-01-13 DIAGNOSIS — R569 Unspecified convulsions: Secondary | ICD-10-CM | POA: Diagnosis not present

## 2021-01-13 DIAGNOSIS — C711 Malignant neoplasm of frontal lobe: Secondary | ICD-10-CM | POA: Diagnosis not present

## 2021-01-13 DIAGNOSIS — Z85828 Personal history of other malignant neoplasm of skin: Secondary | ICD-10-CM | POA: Diagnosis not present

## 2021-01-13 DIAGNOSIS — Z87891 Personal history of nicotine dependence: Secondary | ICD-10-CM | POA: Diagnosis not present

## 2021-01-13 DIAGNOSIS — Z79899 Other long term (current) drug therapy: Secondary | ICD-10-CM | POA: Diagnosis not present

## 2021-01-14 ENCOUNTER — Ambulatory Visit
Admission: RE | Admit: 2021-01-14 | Discharge: 2021-01-14 | Disposition: A | Discharge status: Home / Self Care | Payer: BC Managed Care – PPO | Source: Ambulatory Visit | Attending: Radiation Oncology | Admitting: Radiation Oncology

## 2021-01-14 DIAGNOSIS — Z85828 Personal history of other malignant neoplasm of skin: Secondary | ICD-10-CM | POA: Diagnosis not present

## 2021-01-14 DIAGNOSIS — Z79899 Other long term (current) drug therapy: Secondary | ICD-10-CM | POA: Diagnosis not present

## 2021-01-14 DIAGNOSIS — Z87891 Personal history of nicotine dependence: Secondary | ICD-10-CM | POA: Diagnosis not present

## 2021-01-14 DIAGNOSIS — C711 Malignant neoplasm of frontal lobe: Secondary | ICD-10-CM | POA: Diagnosis not present

## 2021-01-14 DIAGNOSIS — R569 Unspecified convulsions: Secondary | ICD-10-CM | POA: Diagnosis not present

## 2021-01-15 ENCOUNTER — Other Ambulatory Visit: Payer: Self-pay

## 2021-01-15 ENCOUNTER — Ambulatory Visit
Admission: RE | Admit: 2021-01-15 | Discharge: 2021-01-15 | Disposition: A | Discharge status: Home / Self Care | Payer: BC Managed Care – PPO | Source: Ambulatory Visit | Attending: Radiation Oncology | Admitting: Radiation Oncology

## 2021-01-15 DIAGNOSIS — C711 Malignant neoplasm of frontal lobe: Secondary | ICD-10-CM | POA: Diagnosis not present

## 2021-01-15 DIAGNOSIS — R569 Unspecified convulsions: Secondary | ICD-10-CM | POA: Diagnosis not present

## 2021-01-15 DIAGNOSIS — Z85828 Personal history of other malignant neoplasm of skin: Secondary | ICD-10-CM | POA: Diagnosis not present

## 2021-01-15 DIAGNOSIS — Z87891 Personal history of nicotine dependence: Secondary | ICD-10-CM | POA: Diagnosis not present

## 2021-01-15 DIAGNOSIS — Z79899 Other long term (current) drug therapy: Secondary | ICD-10-CM | POA: Diagnosis not present

## 2021-01-18 ENCOUNTER — Inpatient Hospital Stay: Payer: BC Managed Care – PPO | Attending: Internal Medicine

## 2021-01-18 ENCOUNTER — Inpatient Hospital Stay (HOSPITAL_BASED_OUTPATIENT_CLINIC_OR_DEPARTMENT_OTHER): Payer: BC Managed Care – PPO | Admitting: Internal Medicine

## 2021-01-18 ENCOUNTER — Other Ambulatory Visit: Payer: Self-pay

## 2021-01-18 ENCOUNTER — Ambulatory Visit
Admission: RE | Admit: 2021-01-18 | Discharge: 2021-01-18 | Disposition: A | Discharge status: Home / Self Care | Payer: BC Managed Care – PPO | Source: Ambulatory Visit | Attending: Radiation Oncology | Admitting: Radiation Oncology

## 2021-01-18 VITALS — BP 124/62 | HR 70 | Temp 97.8°F | Resp 17 | Ht 67.0 in | Wt 174.8 lb

## 2021-01-18 DIAGNOSIS — Z79899 Other long term (current) drug therapy: Secondary | ICD-10-CM | POA: Insufficient documentation

## 2021-01-18 DIAGNOSIS — R569 Unspecified convulsions: Secondary | ICD-10-CM | POA: Insufficient documentation

## 2021-01-18 DIAGNOSIS — C719 Malignant neoplasm of brain, unspecified: Secondary | ICD-10-CM | POA: Diagnosis not present

## 2021-01-18 DIAGNOSIS — C711 Malignant neoplasm of frontal lobe: Secondary | ICD-10-CM | POA: Insufficient documentation

## 2021-01-18 DIAGNOSIS — Z85828 Personal history of other malignant neoplasm of skin: Secondary | ICD-10-CM | POA: Diagnosis not present

## 2021-01-18 DIAGNOSIS — Z87891 Personal history of nicotine dependence: Secondary | ICD-10-CM | POA: Insufficient documentation

## 2021-01-18 LAB — CMP (CANCER CENTER ONLY)
ALT: 51 U/L — ABNORMAL HIGH (ref 0–44)
AST: 18 U/L (ref 15–41)
Albumin: 3.6 g/dL (ref 3.5–5.0)
Alkaline Phosphatase: 44 U/L (ref 38–126)
Anion gap: 9 (ref 5–15)
BUN: 11 mg/dL (ref 8–23)
CO2: 23 mmol/L (ref 22–32)
Calcium: 8.7 mg/dL — ABNORMAL LOW (ref 8.9–10.3)
Chloride: 107 mmol/L (ref 98–111)
Creatinine: 0.81 mg/dL (ref 0.44–1.00)
GFR, Estimated: >60 mL/min (ref >60)
Glucose, Bld: 79 mg/dL (ref 70–99)
Potassium: 4 mmol/L (ref 3.5–5.1)
Sodium: 139 mmol/L (ref 135–145)
Total Bilirubin: 0.6 mg/dL (ref 0.3–1.2)
Total Protein: 6 g/dL — ABNORMAL LOW (ref 6.5–8.1)

## 2021-01-18 LAB — CBC WITH DIFFERENTIAL (CANCER CENTER ONLY)
Abs Immature Granulocytes: 0.08 10*3/uL — ABNORMAL HIGH (ref 0.00–0.07)
Basophils Absolute: 0 10*3/uL (ref 0.0–0.1)
Basophils Relative: 0 %
Eosinophils Absolute: 0 10*3/uL (ref 0.0–0.5)
Eosinophils Relative: 0 %
HCT: 41.2 % (ref 36.0–46.0)
Hemoglobin: 13.4 g/dL (ref 12.0–15.0)
Immature Granulocytes: 1 %
Lymphocytes Relative: 8 %
Lymphs Abs: 0.8 10*3/uL (ref 0.7–4.0)
MCH: 30.5 pg (ref 26.0–34.0)
MCHC: 32.5 g/dL (ref 30.0–36.0)
MCV: 93.8 fL (ref 80.0–100.0)
Monocytes Absolute: 0.2 10*3/uL (ref 0.1–1.0)
Monocytes Relative: 2 %
Neutro Abs: 8.9 10*3/uL — ABNORMAL HIGH (ref 1.7–7.7)
Neutrophils Relative %: 89 %
Platelet Count: 194 10*3/uL (ref 150–400)
RBC: 4.39 MIL/uL (ref 3.87–5.11)
RDW: 12.6 % (ref 11.5–15.5)
WBC Count: 10 10*3/uL (ref 4.0–10.5)
nRBC: 0 % (ref 0.0–0.2)

## 2021-01-18 MED ORDER — DEXAMETHASONE 1 MG PO TABS: 2.0000 mg | ORAL_TABLET | Freq: Every day | ORAL | 1 refills | Status: DC

## 2021-01-18 NOTE — Progress Notes (Signed)
[facility] [place], [place] [address] [phone]   New Patient Evaluation  Date of Service: 01/18/21 Patient Name: Erika Mitchell Patient MRN: [id number] Patient DOB: March 16, 1955 Provider: Ventura Sellers, MD  Identifying Statement:  Erika Mitchell is a 65 y.o. female with right frontal glioblastoma   Oncologic History: Oncology History  Glioblastoma, IDH-wildtype ([place])  11/25/2020 Surgery   Craniotomy, resection with Dr. Zada Finders; path is c/w GBM, [facility]   12/04/2020 - 12/07/2020 Hospital Admission   Admitted for worsening left sided weakness, MRI demonstrates likely post-surgical localized inflammatory process.  Improves with corticosteroids.   01/06/2021 -  Radiation Therapy   IMRT and concurrent Temodar Erika Mitchell)     Biomarkers:  MGMT Unknown.  IDH 1/2 Wild type.  EGFR Unknown  TERT Unknown   Interval History: Erika Mitchell presents today for follow up, now having completed 2 weeks of IMRT and Temodar.  She reports no new or progressive deficits, tolerating treatment well.  Continues on decadron 52m daily, has gained 10 pounds in past month, though no leg or face swelling.  No recent seizures.  H+P (12/17/20) Patient presented on 11/23/20 with several episodes of sudden dysfunction of left arm and leg, lasting several minutes, with most recent episode accompanied by frank speech arrest while at work.  First episode was less than one week ago.  Between, she has been normal, at baseline.  CNS imaging demonstrated enhancing mass in the right frontal lobe.  She underwent craniotomy, resection with Dr. OZada Finderson 11/25/20, path demonstrated gliolblastoma.  On 10/28, she returned to the hospital with several days of progressive left sided weakness.  After improvement back to baseline with steroids, she was discharged to home.  She has no new complaints currently, is scheduled for repeat MRI brain next week prior to  radiation therapy.    Medications: Current Outpatient Medications on File Prior to Visit  Medication Sig Dispense Refill   acetaminophen (TYLENOL) 500 MG tablet Take 1,000 mg by mouth every 6 (six) hours as needed for moderate pain.     diazepam (VALIUM) 5 MG tablet Take 5 mg by mouth daily as needed. For MRI     levETIRAcetam (KEPPRA) 500 MG tablet Take 1 tablet (500 mg total) by mouth 2 (two) times daily. 60 tablet 5   metoprolol tartrate (LOPRESSOR) 25 MG tablet Take 0.5 tablets (12.5 mg total) by mouth 2 (two) times daily. 60 tablet 0   ondansetron (ZOFRAN) 8 MG tablet Take 1 tablet (8 mg total) by mouth 2 (two) times daily as needed (nausea and vomiting). May take 30-60 minutes prior to Temodar administration if nausea/vomiting occurs. 30 tablet 1   telmisartan (MICARDIS) 40 MG tablet Take 40 mg by mouth daily.     temozolomide (TEMODAR) 140 MG capsule Take 1 capsule (140 mg total) by mouth daily. May take on an empty stomach to decrease nausea & vomiting. 42 capsule 0   ALPRAZolam (XANAX) 0.25 MG tablet Take 4.5 tablets (1.125 mg total) by mouth daily as needed (Vestibular neuritis). Patient states she takes 4 of the 0.25 mg tablets and then splits one 0.25 mg tablet to make total of 1.125 mg daily for vestibular neuritis (Patient not taking: Reported on 12/17/2020)     No current facility-administered medications on file prior to visit.    Allergies:  Allergies  Allergen Reactions   Epinephrine Other (See Comments)    "dizziness, rapid heartbeat"   Azithromycin  dizziness   Motrin [Ibuprofen]     dizziness   Past Medical History:  Past Medical History:  Diagnosis Date   Cancer ([place])    basal cell carcinoma on left arm left eye brow   Hypertension    Vertigo    Vestibular neuritis    right ear   Past Surgical History:  Past Surgical History:  Procedure Laterality Date   APPLICATION OF CRANIAL NAVIGATION Right 11/25/2020   Procedure: APPLICATION OF CRANIAL NAVIGATION;   Surgeon: Judith Part, MD;  Location: [facility];  Service: Neurosurgery;  Laterality: Right;   CHOLECYSTECTOMY  2017   CRANIOTOMY Right 11/25/2020   Procedure: CRANIOTOMY FOR  TUMOR RESECTION WITH Lucky Rathke;  Surgeon: Judith Part, MD;  Location: [facility];  Service: Neurosurgery;  Laterality: Right;   laproscopy     surgery for endometriosisi   OOPHORECTOMY  1974&1990   rt then left ovary   Social History:  Social History   Socioeconomic History   Marital status: Married    Spouse name: [name]   Number of children: 1   Years of education: 14   Highest education level: Not on file  Occupational History    Employer: [employer]  Tobacco Use   Smoking status: Former    Types: Cigarettes    Quit date: 02/08/1996    Years since quitting: 24.9   Smokeless tobacco: Never  Vaping Use   Vaping Use: Never used  Substance and Sexual Activity   Alcohol use: Not Currently    Alcohol/week: 2.0 standard drinks    Types: 2 Glasses of wine per week   Drug use: No   Sexual activity: Not Currently  Other Topics Concern   Not on file  Social History Narrative   Patient is married [name]).     Patient is left-handed.   Patient is working full-time.   Patient has a college education (Associate's)   Patient has one child.   Patient drinks 3 Cups of coffee daily.   Social Determinants of Health   Financial Resource Strain: Not on file  Food Insecurity: Not on file  Transportation Needs: Not on file  Physical Activity: Not on file  Stress: Not on file  Social Connections: Not on file  Intimate Partner Violence: Not on file   Family History:  Family History  Problem Relation Age of Onset   Lung cancer Mother    Aneurysm Father    Heart disease Sister    Atrial fibrillation Brother    Colon cancer Neg Hx     Review of Systems: Constitutional: Doesn't report fevers, chills or abnormal weight loss Eyes: Doesn't report blurriness of vision Ears, nose, mouth,  throat, and face: Doesn't report sore throat Respiratory: Doesn't report cough, dyspnea or wheezes Cardiovascular: Doesn't report palpitation, chest discomfort  Gastrointestinal:  Doesn't report nausea, constipation, diarrhea GU: Doesn't report incontinence Skin: Doesn't report skin rashes Neurological: Per HPI Musculoskeletal: Doesn't report joint pain Behavioral/Psych: Doesn't report anxiety  Physical Exam: Vitals:   01/18/21 0843  BP: 124/62  Pulse: 70  Resp: 17  Temp: 97.8 F (36.6 C)  SpO2: 100%   KPS: 90. General: Alert, cooperative, pleasant, in no acute distress Head: Normal EENT: No conjunctival injection or scleral icterus.  Lungs: Resp effort normal Cardiac: Regular rate Abdomen: Non-distended abdomen Skin: No rashes cyanosis or petechiae. Extremities: No clubbing or edema  Neurologic Exam: Mental Status: Awake, alert, attentive to examiner. Oriented to self and environment. Language is fluent with intact comprehension.  Cranial Nerves: Visual acuity is grossly normal. Visual fields are full. Extra-ocular movements intact. No ptosis. Face is symmetric Motor: Tone and bulk are normal. Power is full in both arms and legs. Reflexes are symmetric, no pathologic reflexes present.  Sensory: Intact to light touch Gait: Normal.   Labs: I have reviewed the data as listed    Component Value Date/Time   NA 139 01/18/2021 0827   K 4.0 01/18/2021 0827   CL 107 01/18/2021 0827   CO2 23 01/18/2021 0827   GLUCOSE 79 01/18/2021 0827   BUN 11 01/18/2021 0827   CREATININE 0.81 01/18/2021 0827   CALCIUM 8.7 (L) 01/18/2021 0827   PROT 6.0 (L) 01/18/2021 0827   ALBUMIN 3.6 01/18/2021 0827   AST 18 01/18/2021 0827   ALT 51 (H) 01/18/2021 0827   ALKPHOS 44 01/18/2021 0827   BILITOT 0.6 01/18/2021 0827   GFRNONAA >60 01/18/2021 0827   Lab Results  Component Value Date   WBC 10.0 01/18/2021   NEUTROABS 8.9 (H) 01/18/2021   HGB 13.4 01/18/2021   HCT 41.2 01/18/2021    MCV 93.8 01/18/2021   PLT 194 01/18/2021    Assessment/Plan Glioblastoma, IDH-wildtype ([place])  Seizures ([place])  Erika Mitchell is clinically stable today, now having completed 2 weeks of IMRT and Temodar.  We ultimately recommended continuing with course of intensity modulated radiation therapy and concurrent daily Temozolomide.  Radiation will be administered Mon-Fri over 6 weeks, Temodar will be dosed at 67m/m2 to be given daily over 42 days.  We reviewed side effects of temodar, including fatigue, nausea/vomiting, constipation, and cytopenias.  Chemotherapy should be held for the following:  ANC less than 1,000  Platelets less than 100,000  LFT or creatinine greater than 2x ULN  If clinical concerns/contraindications develop  Every 2 weeks during radiation, labs will be checked accompanied by a clinical evaluation in the brain tumor clinic.  Decadron should be decreased to 210mdaily x7 days, then 65m665maily thereafter.  Pending CARIS sequencing results.  We ask that Erika COLTRINturn to clinic in 2 weeks with labs for evaluation, or sooner as needed.  All questions were answered. The patient knows to call the clinic with any problems, questions or concerns. No barriers to learning were detected.  The total time spent in the encounter was 30 minutes and more than 50% was on counseling and review of test results   ZacVentura SellersD Medical Director of Neuro-Oncology [facility] Wes[facility]old/12/22 9:22 AM

## 2021-01-19 ENCOUNTER — Ambulatory Visit
Admission: RE | Admit: 2021-01-19 | Discharge: 2021-01-19 | Disposition: A | Discharge status: Home / Self Care | Payer: BC Managed Care – PPO | Source: Ambulatory Visit | Attending: Radiation Oncology | Admitting: Radiation Oncology

## 2021-01-19 ENCOUNTER — Other Ambulatory Visit (HOSPITAL_COMMUNITY): Payer: Self-pay

## 2021-01-19 DIAGNOSIS — Z85828 Personal history of other malignant neoplasm of skin: Secondary | ICD-10-CM | POA: Diagnosis not present

## 2021-01-19 DIAGNOSIS — Z79899 Other long term (current) drug therapy: Secondary | ICD-10-CM | POA: Diagnosis not present

## 2021-01-19 DIAGNOSIS — R569 Unspecified convulsions: Secondary | ICD-10-CM | POA: Diagnosis not present

## 2021-01-19 DIAGNOSIS — Z87891 Personal history of nicotine dependence: Secondary | ICD-10-CM | POA: Diagnosis not present

## 2021-01-19 DIAGNOSIS — C711 Malignant neoplasm of frontal lobe: Secondary | ICD-10-CM | POA: Diagnosis not present

## 2021-01-20 ENCOUNTER — Ambulatory Visit
Admission: RE | Admit: 2021-01-20 | Discharge: 2021-01-20 | Disposition: A | Discharge status: Home / Self Care | Payer: BC Managed Care – PPO | Source: Ambulatory Visit | Attending: Radiation Oncology | Admitting: Radiation Oncology

## 2021-01-20 ENCOUNTER — Other Ambulatory Visit: Payer: Self-pay

## 2021-01-20 ENCOUNTER — Other Ambulatory Visit (HOSPITAL_COMMUNITY): Payer: Self-pay

## 2021-01-20 DIAGNOSIS — C711 Malignant neoplasm of frontal lobe: Secondary | ICD-10-CM | POA: Diagnosis not present

## 2021-01-20 DIAGNOSIS — Z87891 Personal history of nicotine dependence: Secondary | ICD-10-CM | POA: Diagnosis not present

## 2021-01-20 DIAGNOSIS — Z79899 Other long term (current) drug therapy: Secondary | ICD-10-CM | POA: Diagnosis not present

## 2021-01-20 DIAGNOSIS — R569 Unspecified convulsions: Secondary | ICD-10-CM | POA: Diagnosis not present

## 2021-01-20 DIAGNOSIS — Z85828 Personal history of other malignant neoplasm of skin: Secondary | ICD-10-CM | POA: Diagnosis not present

## 2021-01-21 ENCOUNTER — Ambulatory Visit
Admission: RE | Admit: 2021-01-21 | Discharge: 2021-01-21 | Disposition: A | Discharge status: Home / Self Care | Payer: BC Managed Care – PPO | Source: Ambulatory Visit | Attending: Radiation Oncology | Admitting: Radiation Oncology

## 2021-01-21 DIAGNOSIS — Z87891 Personal history of nicotine dependence: Secondary | ICD-10-CM | POA: Diagnosis not present

## 2021-01-21 DIAGNOSIS — Z85828 Personal history of other malignant neoplasm of skin: Secondary | ICD-10-CM | POA: Diagnosis not present

## 2021-01-21 DIAGNOSIS — R569 Unspecified convulsions: Secondary | ICD-10-CM | POA: Diagnosis not present

## 2021-01-21 DIAGNOSIS — C711 Malignant neoplasm of frontal lobe: Secondary | ICD-10-CM | POA: Diagnosis not present

## 2021-01-21 DIAGNOSIS — Z79899 Other long term (current) drug therapy: Secondary | ICD-10-CM | POA: Diagnosis not present

## 2021-01-22 ENCOUNTER — Ambulatory Visit
Admission: RE | Admit: 2021-01-22 | Discharge: 2021-01-22 | Disposition: A | Discharge status: Home / Self Care | Payer: BC Managed Care – PPO | Source: Ambulatory Visit | Attending: Radiation Oncology | Admitting: Radiation Oncology

## 2021-01-22 ENCOUNTER — Other Ambulatory Visit: Payer: Self-pay

## 2021-01-22 DIAGNOSIS — Z85828 Personal history of other malignant neoplasm of skin: Secondary | ICD-10-CM | POA: Diagnosis not present

## 2021-01-22 DIAGNOSIS — R569 Unspecified convulsions: Secondary | ICD-10-CM | POA: Diagnosis not present

## 2021-01-22 DIAGNOSIS — Z87891 Personal history of nicotine dependence: Secondary | ICD-10-CM | POA: Diagnosis not present

## 2021-01-22 DIAGNOSIS — Z79899 Other long term (current) drug therapy: Secondary | ICD-10-CM | POA: Diagnosis not present

## 2021-01-22 DIAGNOSIS — C711 Malignant neoplasm of frontal lobe: Secondary | ICD-10-CM | POA: Diagnosis not present

## 2021-01-25 ENCOUNTER — Other Ambulatory Visit: Payer: Self-pay

## 2021-01-25 ENCOUNTER — Ambulatory Visit
Admission: RE | Admit: 2021-01-25 | Discharge: 2021-01-25 | Disposition: A | Discharge status: Home / Self Care | Payer: BC Managed Care – PPO | Source: Ambulatory Visit | Attending: Radiation Oncology | Admitting: Radiation Oncology

## 2021-01-25 DIAGNOSIS — R569 Unspecified convulsions: Secondary | ICD-10-CM | POA: Diagnosis not present

## 2021-01-25 DIAGNOSIS — C711 Malignant neoplasm of frontal lobe: Secondary | ICD-10-CM | POA: Diagnosis not present

## 2021-01-25 DIAGNOSIS — Z87891 Personal history of nicotine dependence: Secondary | ICD-10-CM | POA: Diagnosis not present

## 2021-01-25 DIAGNOSIS — Z85828 Personal history of other malignant neoplasm of skin: Secondary | ICD-10-CM | POA: Diagnosis not present

## 2021-01-25 DIAGNOSIS — Z79899 Other long term (current) drug therapy: Secondary | ICD-10-CM | POA: Diagnosis not present

## 2021-01-26 ENCOUNTER — Ambulatory Visit
Admission: RE | Admit: 2021-01-26 | Discharge: 2021-01-26 | Disposition: A | Discharge status: Home / Self Care | Payer: BC Managed Care – PPO | Source: Ambulatory Visit | Attending: Radiation Oncology | Admitting: Radiation Oncology

## 2021-01-26 ENCOUNTER — Other Ambulatory Visit (HOSPITAL_COMMUNITY): Payer: Self-pay

## 2021-01-26 DIAGNOSIS — Z85828 Personal history of other malignant neoplasm of skin: Secondary | ICD-10-CM | POA: Diagnosis not present

## 2021-01-26 DIAGNOSIS — Z79899 Other long term (current) drug therapy: Secondary | ICD-10-CM | POA: Diagnosis not present

## 2021-01-26 DIAGNOSIS — R569 Unspecified convulsions: Secondary | ICD-10-CM | POA: Diagnosis not present

## 2021-01-26 DIAGNOSIS — C711 Malignant neoplasm of frontal lobe: Secondary | ICD-10-CM | POA: Diagnosis not present

## 2021-01-26 DIAGNOSIS — Z87891 Personal history of nicotine dependence: Secondary | ICD-10-CM | POA: Diagnosis not present

## 2021-01-27 ENCOUNTER — Ambulatory Visit
Admission: RE | Admit: 2021-01-27 | Discharge: 2021-01-27 | Disposition: A | Discharge status: Home / Self Care | Payer: BC Managed Care – PPO | Source: Ambulatory Visit | Attending: Radiation Oncology | Admitting: Radiation Oncology

## 2021-01-27 ENCOUNTER — Other Ambulatory Visit: Payer: Self-pay

## 2021-01-27 DIAGNOSIS — C711 Malignant neoplasm of frontal lobe: Secondary | ICD-10-CM | POA: Diagnosis not present

## 2021-01-27 DIAGNOSIS — Z79899 Other long term (current) drug therapy: Secondary | ICD-10-CM | POA: Diagnosis not present

## 2021-01-27 DIAGNOSIS — Z85828 Personal history of other malignant neoplasm of skin: Secondary | ICD-10-CM | POA: Diagnosis not present

## 2021-01-27 DIAGNOSIS — Z87891 Personal history of nicotine dependence: Secondary | ICD-10-CM | POA: Diagnosis not present

## 2021-01-27 DIAGNOSIS — R569 Unspecified convulsions: Secondary | ICD-10-CM | POA: Diagnosis not present

## 2021-01-28 ENCOUNTER — Ambulatory Visit
Admission: RE | Admit: 2021-01-28 | Discharge: 2021-01-28 | Disposition: A | Discharge status: Home / Self Care | Payer: BC Managed Care – PPO | Source: Ambulatory Visit | Attending: Radiation Oncology | Admitting: Radiation Oncology

## 2021-01-28 DIAGNOSIS — Z87891 Personal history of nicotine dependence: Secondary | ICD-10-CM | POA: Diagnosis not present

## 2021-01-28 DIAGNOSIS — C711 Malignant neoplasm of frontal lobe: Secondary | ICD-10-CM | POA: Diagnosis not present

## 2021-01-28 DIAGNOSIS — Z85828 Personal history of other malignant neoplasm of skin: Secondary | ICD-10-CM | POA: Diagnosis not present

## 2021-01-28 DIAGNOSIS — Z79899 Other long term (current) drug therapy: Secondary | ICD-10-CM | POA: Diagnosis not present

## 2021-01-28 DIAGNOSIS — R569 Unspecified convulsions: Secondary | ICD-10-CM | POA: Diagnosis not present

## 2021-01-29 ENCOUNTER — Other Ambulatory Visit: Payer: Self-pay

## 2021-01-29 ENCOUNTER — Ambulatory Visit
Admission: RE | Admit: 2021-01-29 | Discharge: 2021-01-29 | Disposition: A | Discharge status: Home / Self Care | Payer: BC Managed Care – PPO | Source: Ambulatory Visit | Attending: Radiation Oncology | Admitting: Radiation Oncology

## 2021-01-29 DIAGNOSIS — Z85828 Personal history of other malignant neoplasm of skin: Secondary | ICD-10-CM | POA: Diagnosis not present

## 2021-01-29 DIAGNOSIS — R569 Unspecified convulsions: Secondary | ICD-10-CM | POA: Diagnosis not present

## 2021-01-29 DIAGNOSIS — Z79899 Other long term (current) drug therapy: Secondary | ICD-10-CM | POA: Diagnosis not present

## 2021-01-29 DIAGNOSIS — Z87891 Personal history of nicotine dependence: Secondary | ICD-10-CM | POA: Diagnosis not present

## 2021-01-29 DIAGNOSIS — C711 Malignant neoplasm of frontal lobe: Secondary | ICD-10-CM | POA: Diagnosis not present

## 2021-02-02 ENCOUNTER — Inpatient Hospital Stay: Payer: BC Managed Care – PPO

## 2021-02-02 ENCOUNTER — Inpatient Hospital Stay (HOSPITAL_BASED_OUTPATIENT_CLINIC_OR_DEPARTMENT_OTHER): Payer: BC Managed Care – PPO | Admitting: Internal Medicine

## 2021-02-02 ENCOUNTER — Ambulatory Visit
Admission: RE | Admit: 2021-02-02 | Discharge: 2021-02-02 | Disposition: A | Discharge status: Home / Self Care | Payer: BC Managed Care – PPO | Source: Ambulatory Visit | Attending: Radiation Oncology | Admitting: Radiation Oncology

## 2021-02-02 ENCOUNTER — Other Ambulatory Visit: Payer: Self-pay

## 2021-02-02 VITALS — BP 127/67 | HR 62 | Temp 97.9°F | Resp 17 | Ht 67.0 in | Wt 172.8 lb

## 2021-02-02 DIAGNOSIS — C719 Malignant neoplasm of brain, unspecified: Secondary | ICD-10-CM

## 2021-02-02 DIAGNOSIS — R569 Unspecified convulsions: Secondary | ICD-10-CM

## 2021-02-02 DIAGNOSIS — Z79899 Other long term (current) drug therapy: Secondary | ICD-10-CM | POA: Diagnosis not present

## 2021-02-02 DIAGNOSIS — Z87891 Personal history of nicotine dependence: Secondary | ICD-10-CM | POA: Diagnosis not present

## 2021-02-02 DIAGNOSIS — C711 Malignant neoplasm of frontal lobe: Secondary | ICD-10-CM | POA: Diagnosis not present

## 2021-02-02 DIAGNOSIS — Z85828 Personal history of other malignant neoplasm of skin: Secondary | ICD-10-CM | POA: Diagnosis not present

## 2021-02-02 LAB — CBC WITH DIFFERENTIAL (CANCER CENTER ONLY)
Abs Immature Granulocytes: 0.02 10*3/uL (ref 0.00–0.07)
Basophils Absolute: 0 10*3/uL (ref 0.0–0.1)
Basophils Relative: 0 %
Eosinophils Absolute: 0 10*3/uL (ref 0.0–0.5)
Eosinophils Relative: 0 %
HCT: 38.5 % (ref 36.0–46.0)
Hemoglobin: 12.9 g/dL (ref 12.0–15.0)
Immature Granulocytes: 0 %
Lymphocytes Relative: 16 %
Lymphs Abs: 0.8 10*3/uL (ref 0.7–4.0)
MCH: 31.4 pg (ref 26.0–34.0)
MCHC: 33.5 g/dL (ref 30.0–36.0)
MCV: 93.7 fL (ref 80.0–100.0)
Monocytes Absolute: 0.3 10*3/uL (ref 0.1–1.0)
Monocytes Relative: 6 %
Neutro Abs: 3.9 10*3/uL (ref 1.7–7.7)
Neutrophils Relative %: 78 %
Platelet Count: 180 10*3/uL (ref 150–400)
RBC: 4.11 MIL/uL (ref 3.87–5.11)
RDW: 12.6 % (ref 11.5–15.5)
WBC Count: 5.1 10*3/uL (ref 4.0–10.5)
nRBC: 0 % (ref 0.0–0.2)

## 2021-02-02 LAB — CMP (CANCER CENTER ONLY)
ALT: 31 U/L (ref 0–44)
AST: 17 U/L (ref 15–41)
Albumin: 4 g/dL (ref 3.5–5.0)
Alkaline Phosphatase: 45 U/L (ref 38–126)
Anion gap: 7 (ref 5–15)
BUN: 10 mg/dL (ref 8–23)
CO2: 27 mmol/L (ref 22–32)
Calcium: 9.6 mg/dL (ref 8.9–10.3)
Chloride: 107 mmol/L (ref 98–111)
Creatinine: 0.79 mg/dL (ref 0.44–1.00)
GFR, Estimated: >60 mL/min (ref >60)
Glucose, Bld: 105 mg/dL — ABNORMAL HIGH (ref 70–99)
Potassium: 3.7 mmol/L (ref 3.5–5.1)
Sodium: 141 mmol/L (ref 135–145)
Total Bilirubin: 0.5 mg/dL (ref 0.3–1.2)
Total Protein: 6.3 g/dL — ABNORMAL LOW (ref 6.5–8.1)

## 2021-02-02 MED ORDER — SONAFINE EX EMUL
1.0000 "application " | Freq: Once | CUTANEOUS | Status: AC
  Administered 2021-02-02: 1 via TOPICAL

## 2021-02-02 MED ORDER — REMEDY PHYTOPLEX ANTIFUNGAL 2 % EX POWD: Freq: Once | CUTANEOUS | 0 refills | Status: DC

## 2021-02-02 NOTE — Progress Notes (Signed)
[facility] [place], [place] [address] [phone]   New Patient Evaluation  Date of Service: 02/02/21 Patient Name: Erika Mitchell Patient MRN: [id number] Patient DOB: 11-Nov-1955 Provider: Ventura Sellers, MD  Identifying Statement:  Erika Mitchell is a 65 y.o. female with right frontal glioblastoma   Oncologic History: Oncology History  Glioblastoma, IDH-wildtype ([place])  11/25/2020 Surgery   Craniotomy, resection with Dr. Zada Finders; path is c/w GBM, [facility]   12/04/2020 - 12/07/2020 Hospital Admission   Admitted for worsening left sided weakness, MRI demonstrates likely post-surgical localized inflammatory process.  Improves with corticosteroids.   01/06/2021 -  Radiation Therapy   IMRT and concurrent Temodar Isidore Moos)     Biomarkers:  MGMT Unknown.  IDH 1/2 Wild type.  EGFR Unknown  TERT Unknown   Interval History: Erika Mitchell presents today for follow up, now having completed 4 weeks of IMRT and Temodar.  No clinical changes reported, tolerating treatment well.  Decadron has decreased down to 98m daily.  Leg and face swelling are improved.  No recent seizures.  H+P (12/17/20) Patient presented on 11/23/20 with several episodes of sudden dysfunction of left arm and leg, lasting several minutes, with most recent episode accompanied by frank speech arrest while at work.  First episode was less than one week ago.  Between, she has been normal, at baseline.  CNS imaging demonstrated enhancing mass in the right frontal lobe.  She underwent craniotomy, resection with Dr. OZada Finderson 11/25/20, path demonstrated gliolblastoma.  On 10/28, she returned to the hospital with several days of progressive left sided weakness.  After improvement back to baseline with steroids, she was discharged to home.  She has no new complaints currently, is scheduled for repeat MRI brain next week prior to radiation therapy.     Medications: Current Outpatient Medications on File Prior to Visit  Medication Sig Dispense Refill   acetaminophen (TYLENOL) 500 MG tablet Take 1,000 mg by mouth every 6 (six) hours as needed for moderate pain.     ALPRAZolam (XANAX) 0.25 MG tablet Take 4.5 tablets (1.125 mg total) by mouth daily as needed (Vestibular neuritis). Patient states she takes 4 of the 0.25 mg tablets and then splits one 0.25 mg tablet to make total of 1.125 mg daily for vestibular neuritis (Patient not taking: Reported on 12/17/2020)     dexamethasone (DECADRON) 1 MG tablet Take 2 tablets (2 mg total) by mouth daily. 60 tablet 1   diazepam (VALIUM) 5 MG tablet Take 5 mg by mouth daily as needed. For MRI     levETIRAcetam (KEPPRA) 500 MG tablet Take 1 tablet (500 mg total) by mouth 2 (two) times daily. 60 tablet 5   ondansetron (ZOFRAN) 8 MG tablet Take 1 tablet (8 mg total) by mouth 2 (two) times daily as needed (nausea and vomiting). May take 30-60 minutes prior to Temodar administration if nausea/vomiting occurs. 30 tablet 1   telmisartan (MICARDIS) 40 MG tablet Take 40 mg by mouth daily.     temozolomide (TEMODAR) 140 MG capsule Take 1 capsule (140 mg total) by mouth daily. May take on an empty stomach to decrease nausea & vomiting. 42 capsule 0   No current facility-administered medications on file prior to visit.    Allergies:  Allergies  Allergen Reactions   Epinephrine Other (See Comments)    "dizziness, rapid heartbeat"   Azithromycin     dizziness   Motrin [Ibuprofen]  dizziness   Ciprofloxacin     Other reaction(s): vertigo   Sulfa Antibiotics     Other reaction(s): rash   Past Medical History:  Past Medical History:  Diagnosis Date   Cancer ([place])    basal cell carcinoma on left arm left eye brow   Hypertension    Vertigo    Vestibular neuritis    right ear   Past Surgical History:  Past Surgical History:  Procedure Laterality Date   APPLICATION OF CRANIAL NAVIGATION Right  11/25/2020   Procedure: APPLICATION OF CRANIAL NAVIGATION;  Surgeon: Judith Part, MD;  Location: [facility];  Service: Neurosurgery;  Laterality: Right;   CHOLECYSTECTOMY  2017   CRANIOTOMY Right 11/25/2020   Procedure: CRANIOTOMY FOR  TUMOR RESECTION WITH Lucky Rathke;  Surgeon: Judith Part, MD;  Location: [facility];  Service: Neurosurgery;  Laterality: Right;   laproscopy     surgery for endometriosisi   OOPHORECTOMY  1974&1990   rt then left ovary   Social History:  Social History   Socioeconomic History   Marital status: Married    Spouse name: [name]   Number of children: 1   Years of education: 14   Highest education level: Not on file  Occupational History    Employer: [employer]  Tobacco Use   Smoking status: Former    Types: Cigarettes    Quit date: 02/08/1996    Years since quitting: 25.0   Smokeless tobacco: Never  Vaping Use   Vaping Use: Never used  Substance and Sexual Activity   Alcohol use: Not Currently    Alcohol/week: 2.0 standard drinks    Types: 2 Glasses of wine per week   Drug use: No   Sexual activity: Not Currently  Other Topics Concern   Not on file  Social History Narrative   Patient is married [name]).     Patient is left-handed.   Patient is working full-time.   Patient has a college education (Associate's)   Patient has one child.   Patient drinks 3 Cups of coffee daily.   Social Determinants of Health   Financial Resource Strain: Not on file  Food Insecurity: Not on file  Transportation Needs: Not on file  Physical Activity: Not on file  Stress: Not on file  Social Connections: Not on file  Intimate Partner Violence: Not on file   Family History:  Family History  Problem Relation Age of Onset   Lung cancer Mother    Aneurysm Father    Heart disease Sister    Atrial fibrillation Brother    Colon cancer Neg Hx     Review of Systems: Constitutional: Doesn't report fevers, chills or abnormal weight loss Eyes:  Doesn't report blurriness of vision Ears, nose, mouth, throat, and face: Doesn't report sore throat Respiratory: Doesn't report cough, dyspnea or wheezes Cardiovascular: Doesn't report palpitation, chest discomfort  Gastrointestinal:  Doesn't report nausea, constipation, diarrhea GU: Doesn't report incontinence Skin: Doesn't report skin rashes Neurological: Per HPI Musculoskeletal: Doesn't report joint pain Behavioral/Psych: Doesn't report anxiety  Physical Exam: Vitals:   02/02/21 1105  BP: 127/67  Pulse: 62  Resp: 17  Temp: 97.9 F (36.6 C)  SpO2: 100%   KPS: 90. General: Alert, cooperative, pleasant, in no acute distress Head: Normal EENT: No conjunctival injection or scleral icterus.  Lungs: Resp effort normal Cardiac: Regular rate Abdomen: Non-distended abdomen Skin: No rashes cyanosis or petechiae. Extremities: No clubbing or edema  Neurologic Exam: Mental Status: Awake, alert, attentive to examiner.  Oriented to self and environment. Language is fluent with intact comprehension.  Cranial Nerves: Visual acuity is grossly normal. Visual fields are full. Extra-ocular movements intact. No ptosis. Face is symmetric Motor: Tone and bulk are normal. Power is full in both arms and legs. Reflexes are symmetric, no pathologic reflexes present.  Sensory: Intact to light touch Gait: Normal.   Labs: I have reviewed the data as listed    Component Value Date/Time   NA 139 01/18/2021 0827   K 4.0 01/18/2021 0827   CL 107 01/18/2021 0827   CO2 23 01/18/2021 0827   GLUCOSE 79 01/18/2021 0827   BUN 11 01/18/2021 0827   CREATININE 0.81 01/18/2021 0827   CALCIUM 8.7 (L) 01/18/2021 0827   PROT 6.0 (L) 01/18/2021 0827   ALBUMIN 3.6 01/18/2021 0827   AST 18 01/18/2021 0827   ALT 51 (H) 01/18/2021 0827   ALKPHOS 44 01/18/2021 0827   BILITOT 0.6 01/18/2021 0827   GFRNONAA >60 01/18/2021 0827   Lab Results  Component Value Date   WBC 5.1 02/02/2021   NEUTROABS 3.9 02/02/2021    HGB 12.9 02/02/2021   HCT 38.5 02/02/2021   MCV 93.7 02/02/2021   PLT 180 02/02/2021    Assessment/Plan Glioblastoma, IDH-wildtype ([place])  Seizures ([place])  MERIAL MORITZ is clinically stable today, now having completed 4 weeks of IMRT and Temodar.  We ultimately recommended continuing with course of intensity modulated radiation therapy and concurrent daily Temozolomide.  Radiation will be administered Mon-Fri over 6 weeks, Temodar will be dosed at 45m/m2 to be given daily over 42 days.  We reviewed side effects of temodar, including fatigue, nausea/vomiting, constipation, and cytopenias.  Chemotherapy should be held for the following:  ANC less than 1,000  Platelets less than 100,000  LFT or creatinine greater than 2x ULN  If clinical concerns/contraindications develop  Every 2 weeks during radiation, labs will be checked accompanied by a clinical evaluation in the brain tumor clinic.  Decadron can be discontinued if tolerated.   Pending CARIS sequencing results.  We ask that SBRANTLEIGH MIFFLINreturn to clinic in 2 weeks with labs for evaluation, or sooner as needed.  All questions were answered. The patient knows to call the clinic with any problems, questions or concerns. No barriers to learning were detected.  The total time spent in the encounter was 30 minutes and more than 50% was on counseling and review of test results   ZVentura Sellers MD Medical Director of Neuro-Oncology [facility]12/27/22 11:15 AM

## 2021-02-03 ENCOUNTER — Ambulatory Visit
Admission: RE | Admit: 2021-02-03 | Discharge: 2021-02-03 | Disposition: A | Discharge status: Home / Self Care | Payer: BC Managed Care – PPO | Source: Ambulatory Visit | Attending: Radiation Oncology | Admitting: Radiation Oncology

## 2021-02-03 DIAGNOSIS — Z79899 Other long term (current) drug therapy: Secondary | ICD-10-CM | POA: Diagnosis not present

## 2021-02-03 DIAGNOSIS — Z87891 Personal history of nicotine dependence: Secondary | ICD-10-CM | POA: Diagnosis not present

## 2021-02-03 DIAGNOSIS — C711 Malignant neoplasm of frontal lobe: Secondary | ICD-10-CM | POA: Diagnosis not present

## 2021-02-03 DIAGNOSIS — R569 Unspecified convulsions: Secondary | ICD-10-CM | POA: Diagnosis not present

## 2021-02-03 DIAGNOSIS — Z85828 Personal history of other malignant neoplasm of skin: Secondary | ICD-10-CM | POA: Diagnosis not present

## 2021-02-04 ENCOUNTER — Other Ambulatory Visit: Payer: Self-pay

## 2021-02-04 ENCOUNTER — Ambulatory Visit
Admission: RE | Admit: 2021-02-04 | Discharge: 2021-02-04 | Disposition: A | Discharge status: Home / Self Care | Payer: BC Managed Care – PPO | Source: Ambulatory Visit | Attending: Radiation Oncology | Admitting: Radiation Oncology

## 2021-02-04 DIAGNOSIS — Z85828 Personal history of other malignant neoplasm of skin: Secondary | ICD-10-CM | POA: Diagnosis not present

## 2021-02-04 DIAGNOSIS — Z79899 Other long term (current) drug therapy: Secondary | ICD-10-CM | POA: Diagnosis not present

## 2021-02-04 DIAGNOSIS — Z87891 Personal history of nicotine dependence: Secondary | ICD-10-CM | POA: Diagnosis not present

## 2021-02-04 DIAGNOSIS — C711 Malignant neoplasm of frontal lobe: Secondary | ICD-10-CM | POA: Diagnosis not present

## 2021-02-04 DIAGNOSIS — R569 Unspecified convulsions: Secondary | ICD-10-CM | POA: Diagnosis not present

## 2021-02-05 ENCOUNTER — Ambulatory Visit
Admission: RE | Admit: 2021-02-05 | Discharge: 2021-02-05 | Disposition: A | Discharge status: Home / Self Care | Payer: BC Managed Care – PPO | Source: Ambulatory Visit | Attending: Radiation Oncology | Admitting: Radiation Oncology

## 2021-02-05 DIAGNOSIS — Z79899 Other long term (current) drug therapy: Secondary | ICD-10-CM | POA: Diagnosis not present

## 2021-02-05 DIAGNOSIS — C711 Malignant neoplasm of frontal lobe: Secondary | ICD-10-CM | POA: Diagnosis not present

## 2021-02-05 DIAGNOSIS — Z87891 Personal history of nicotine dependence: Secondary | ICD-10-CM | POA: Diagnosis not present

## 2021-02-05 DIAGNOSIS — Z85828 Personal history of other malignant neoplasm of skin: Secondary | ICD-10-CM | POA: Diagnosis not present

## 2021-02-05 DIAGNOSIS — R569 Unspecified convulsions: Secondary | ICD-10-CM | POA: Diagnosis not present

## 2021-02-09 ENCOUNTER — Other Ambulatory Visit: Payer: Self-pay

## 2021-02-09 ENCOUNTER — Ambulatory Visit
Admission: RE | Admit: 2021-02-09 | Discharge: 2021-02-09 | Disposition: A | Discharge status: Home / Self Care | Payer: BC Managed Care – PPO | Source: Ambulatory Visit | Attending: Radiation Oncology | Admitting: Radiation Oncology

## 2021-02-09 ENCOUNTER — Other Ambulatory Visit: Payer: Self-pay | Admitting: Radiation Oncology

## 2021-02-09 DIAGNOSIS — Z510 Encounter for antineoplastic radiation therapy: Secondary | ICD-10-CM | POA: Diagnosis not present

## 2021-02-09 DIAGNOSIS — C719 Malignant neoplasm of brain, unspecified: Secondary | ICD-10-CM

## 2021-02-09 DIAGNOSIS — C711 Malignant neoplasm of frontal lobe: Secondary | ICD-10-CM | POA: Diagnosis not present

## 2021-02-09 MED ORDER — ONDANSETRON HCL 8 MG PO TABS: 8.0000 mg | ORAL_TABLET | Freq: Three times a day (TID) | ORAL | 1 refills | Status: DC | PRN

## 2021-02-10 ENCOUNTER — Ambulatory Visit
Admission: RE | Admit: 2021-02-10 | Discharge: 2021-02-10 | Disposition: A | Discharge status: Home / Self Care | Payer: BC Managed Care – PPO | Source: Ambulatory Visit | Attending: Radiation Oncology | Admitting: Radiation Oncology

## 2021-02-10 DIAGNOSIS — C711 Malignant neoplasm of frontal lobe: Secondary | ICD-10-CM | POA: Diagnosis not present

## 2021-02-10 DIAGNOSIS — Z510 Encounter for antineoplastic radiation therapy: Secondary | ICD-10-CM | POA: Diagnosis not present

## 2021-02-11 ENCOUNTER — Other Ambulatory Visit: Payer: Self-pay

## 2021-02-11 ENCOUNTER — Ambulatory Visit
Admission: RE | Admit: 2021-02-11 | Discharge: 2021-02-11 | Disposition: A | Discharge status: Home / Self Care | Payer: BC Managed Care – PPO | Source: Ambulatory Visit | Attending: Radiation Oncology | Admitting: Radiation Oncology

## 2021-02-11 DIAGNOSIS — Z510 Encounter for antineoplastic radiation therapy: Secondary | ICD-10-CM | POA: Diagnosis not present

## 2021-02-11 DIAGNOSIS — C711 Malignant neoplasm of frontal lobe: Secondary | ICD-10-CM | POA: Diagnosis not present

## 2021-02-12 ENCOUNTER — Encounter (HOSPITAL_COMMUNITY): Payer: Self-pay

## 2021-02-12 ENCOUNTER — Ambulatory Visit
Admission: RE | Admit: 2021-02-12 | Discharge: 2021-02-12 | Disposition: A | Discharge status: Home / Self Care | Payer: BC Managed Care – PPO | Source: Ambulatory Visit | Attending: Radiation Oncology | Admitting: Radiation Oncology

## 2021-02-12 DIAGNOSIS — C711 Malignant neoplasm of frontal lobe: Secondary | ICD-10-CM | POA: Diagnosis not present

## 2021-02-12 DIAGNOSIS — Z510 Encounter for antineoplastic radiation therapy: Secondary | ICD-10-CM | POA: Diagnosis not present

## 2021-02-15 ENCOUNTER — Ambulatory Visit
Admission: RE | Admit: 2021-02-15 | Discharge: 2021-02-15 | Disposition: A | Discharge status: Home / Self Care | Payer: BC Managed Care – PPO | Source: Ambulatory Visit | Attending: Radiation Oncology | Admitting: Radiation Oncology

## 2021-02-15 ENCOUNTER — Other Ambulatory Visit: Payer: Self-pay

## 2021-02-15 ENCOUNTER — Inpatient Hospital Stay: Payer: BC Managed Care – PPO

## 2021-02-15 ENCOUNTER — Inpatient Hospital Stay (HOSPITAL_BASED_OUTPATIENT_CLINIC_OR_DEPARTMENT_OTHER): Payer: BC Managed Care – PPO | Admitting: Internal Medicine

## 2021-02-15 VITALS — BP 129/81 | HR 86 | Temp 97.7°F | Resp 18 | Ht 67.0 in | Wt 162.0 lb

## 2021-02-15 DIAGNOSIS — C711 Malignant neoplasm of frontal lobe: Secondary | ICD-10-CM | POA: Diagnosis not present

## 2021-02-15 DIAGNOSIS — R569 Unspecified convulsions: Secondary | ICD-10-CM

## 2021-02-15 DIAGNOSIS — Z510 Encounter for antineoplastic radiation therapy: Secondary | ICD-10-CM | POA: Diagnosis not present

## 2021-02-15 DIAGNOSIS — C719 Malignant neoplasm of brain, unspecified: Secondary | ICD-10-CM | POA: Diagnosis not present

## 2021-02-15 DIAGNOSIS — Z79899 Other long term (current) drug therapy: Secondary | ICD-10-CM | POA: Insufficient documentation

## 2021-02-15 LAB — CMP (CANCER CENTER ONLY)
ALT: 21 U/L (ref 0–44)
AST: 16 U/L (ref 15–41)
Albumin: 4 g/dL (ref 3.5–5.0)
Alkaline Phosphatase: 44 U/L (ref 38–126)
Anion gap: 7 (ref 5–15)
BUN: 11 mg/dL (ref 8–23)
CO2: 24 mmol/L (ref 22–32)
Calcium: 9.4 mg/dL (ref 8.9–10.3)
Chloride: 109 mmol/L (ref 98–111)
Creatinine: 0.87 mg/dL (ref 0.44–1.00)
GFR, Estimated: >60 mL/min (ref >60)
Glucose, Bld: 97 mg/dL (ref 70–99)
Potassium: 3.8 mmol/L (ref 3.5–5.1)
Sodium: 140 mmol/L (ref 135–145)
Total Bilirubin: 0.7 mg/dL (ref 0.3–1.2)
Total Protein: 6.3 g/dL — ABNORMAL LOW (ref 6.5–8.1)

## 2021-02-15 LAB — CBC WITH DIFFERENTIAL (CANCER CENTER ONLY)
Abs Immature Granulocytes: 0.02 10*3/uL (ref 0.00–0.07)
Basophils Absolute: 0 10*3/uL (ref 0.0–0.1)
Basophils Relative: 1 %
Eosinophils Absolute: 0.1 10*3/uL (ref 0.0–0.5)
Eosinophils Relative: 1 %
HCT: 39.7 % (ref 36.0–46.0)
Hemoglobin: 13.2 g/dL (ref 12.0–15.0)
Immature Granulocytes: 1 %
Lymphocytes Relative: 13 %
Lymphs Abs: 0.5 10*3/uL — ABNORMAL LOW (ref 0.7–4.0)
MCH: 30.6 pg (ref 26.0–34.0)
MCHC: 33.2 g/dL (ref 30.0–36.0)
MCV: 92.1 fL (ref 80.0–100.0)
Monocytes Absolute: 0.3 10*3/uL (ref 0.1–1.0)
Monocytes Relative: 7 %
Neutro Abs: 3.2 10*3/uL (ref 1.7–7.7)
Neutrophils Relative %: 77 %
Platelet Count: 171 10*3/uL (ref 150–400)
RBC: 4.31 MIL/uL (ref 3.87–5.11)
RDW: 12.4 % (ref 11.5–15.5)
WBC Count: 4.1 10*3/uL (ref 4.0–10.5)
nRBC: 0 % (ref 0.0–0.2)

## 2021-02-15 MED ORDER — LEVETIRACETAM 1000 MG PO TABS: 1000.0000 mg | ORAL_TABLET | Freq: Two times a day (BID) | ORAL | 3 refills | Status: DC

## 2021-02-15 MED ORDER — DEXAMETHASONE 2 MG PO TABS: ORAL_TABLET | ORAL | 0 refills | Status: AC

## 2021-02-15 NOTE — Progress Notes (Signed)
[facility] [address]  [place], [place] [address] [phone]   Interval Evaluation  Date of Service: 02/15/21 Patient Name: Erika Mitchell Patient MRN: [id number] Patient DOB: Jul 11, 1955 Provider: Henreitta Leber, MD  Identifying Statement:  Erika Mitchell is a 66 y.o. female with right frontal glioblastoma   Oncologic History: Oncology History  Glioblastoma, IDH-wildtype ([facility])  11/25/2020 Surgery   Craniotomy, resection with Dr. Maurice Small; path is c/w GBM, [facility]   12/04/2020 - 12/07/2020 Hospital Admission   Admitted for worsening left sided weakness, MRI demonstrates likely post-surgical localized inflammatory process.  Improves with corticosteroids.   01/06/2021 -  Radiation Therapy   IMRT and concurrent Temodar Basilio [place])     Biomarkers:  MGMT Unknown.  IDH 1/2 Wild type.  EGFR Unknown  TERT Unknown   Interval History: Erika Mitchell presents today for follow up, now in final week of IMRT and Temodar.  She describes recurrence of left arm and hand weakness, clumsiness over the past week.  Gait is also affected by new left leg weakness, though not as significant.  Overall she feels more lethargy and fatigue.  Seizure events have recurred, with near daily episodes of left hand twitching.  Otherwise no issues with radiation or Temodar.  H+P (12/17/20) Patient presented on 11/23/20 with several episodes of sudden dysfunction of left arm and leg, lasting several minutes, with most recent episode accompanied by frank speech arrest while at work.  First episode was less than one week ago.  Between, she has been normal, at baseline.  CNS imaging demonstrated enhancing mass in the right frontal lobe.  She underwent craniotomy, resection with Dr. Maurice Small on 11/25/20, path demonstrated gliolblastoma.  On 10/28, she returned to the hospital with several days of progressive left sided weakness.  After improvement back to baseline with  steroids, she was discharged to home.  She has no new complaints currently, is scheduled for repeat MRI brain next week prior to radiation therapy.    Medications: Current Outpatient Medications on File Prior to Visit  Medication Sig Dispense Refill   acetaminophen (TYLENOL) 500 MG tablet Take 500 mg by mouth every 6 (six) hours as needed for moderate pain.     ALPRAZolam (XANAX) 0.25 MG tablet Take 4.5 tablets (1.125 mg total) by mouth daily as needed (Vestibular neuritis). Patient states she takes 4 of the 0.25 mg tablets and then splits one 0.25 mg tablet to make total of 1.125 mg daily for vestibular neuritis (Patient not taking: Reported on 12/17/2020)     dexamethasone (DECADRON) 1 MG tablet Take 2 tablets (2 mg total) by mouth daily. 60 tablet 1   diazepam (VALIUM) 5 MG tablet Take 5 mg by mouth daily as needed. For MRI (Patient not taking: Reported on 02/02/2021)     levETIRAcetam (KEPPRA) 500 MG tablet Take 1 tablet (500 mg total) by mouth 2 (two) times daily. 60 tablet 5   ondansetron (ZOFRAN) 8 MG tablet Take 1 tablet (8 mg total) by mouth 3 (three) times daily as needed (nausea and vomiting). May take 30-60 minutes prior to Temodar administration if nausea/vomiting occurs. 30 tablet 1   telmisartan (MICARDIS) 40 MG tablet Take 40 mg by mouth daily.     temozolomide (TEMODAR) 140 MG capsule Take 1 capsule (140 mg total) by mouth daily. May take on an empty stomach to decrease nausea & vomiting. 42 capsule 0   No current facility-administered medications on file prior to visit.  Allergies:  Allergies  Allergen Reactions   Epinephrine Other (See Comments)    "dizziness, rapid heartbeat"   Azithromycin     dizziness   Motrin [Ibuprofen]     dizziness   Ciprofloxacin     Other reaction(s): vertigo   Sulfa Antibiotics     Other reaction(s): rash   Past Medical History:  Past Medical History:  Diagnosis Date   Cancer ([place])    basal cell carcinoma on left arm left eye brow    Hypertension    Vertigo    Vestibular neuritis    right ear   Past Surgical History:  Past Surgical History:  Procedure Laterality Date   APPLICATION OF CRANIAL NAVIGATION Right 11/25/2020   Procedure: APPLICATION OF CRANIAL NAVIGATION;  Surgeon: Judith Part, MD;  Location: [facility];  Service: Neurosurgery;  Laterality: Right;   CHOLECYSTECTOMY  2017   CRANIOTOMY Right 11/25/2020   Procedure: CRANIOTOMY FOR  TUMOR RESECTION WITH Lucky Rathke;  Surgeon: Judith Part, MD;  Location: [facility];  Service: Neurosurgery;  Laterality: Right;   laproscopy     surgery for endometriosisi   OOPHORECTOMY  1974&1990   rt then left ovary   Social History:  Social History   Socioeconomic History   Marital status: Married    Spouse name: [name]   Number of children: 1   Years of education: 14   Highest education level: Not on file  Occupational History    Employer: [employer]  Tobacco Use   Smoking status: Former    Types: Cigarettes    Quit date: 02/08/1996    Years since quitting: 25.0   Smokeless tobacco: Never  Vaping Use   Vaping Use: Never used  Substance and Sexual Activity   Alcohol use: Not Currently    Alcohol/week: 2.0 standard drinks    Types: 2 Glasses of wine per week   Drug use: No   Sexual activity: Not Currently  Other Topics Concern   Not on file  Social History Narrative   Patient is married [name]).     Patient is left-handed.   Patient is working full-time.   Patient has a college education (Associate's)   Patient has one child.   Patient drinks 3 Cups of coffee daily.   Social Determinants of Health   Financial Resource Strain: Not on file  Food Insecurity: Not on file  Transportation Needs: Not on file  Physical Activity: Not on file  Stress: Not on file  Social Connections: Not on file  Intimate Partner Violence: Not on file   Family History:  Family History  Problem Relation Age of Onset   Lung cancer Mother    Aneurysm Father     Heart disease Sister    Atrial fibrillation Brother    Colon cancer Neg Hx     Review of Systems: Constitutional: Doesn't report fevers, chills or abnormal weight loss Eyes: Doesn't report blurriness of vision Ears, nose, mouth, throat, and face: Doesn't report sore throat Respiratory: Doesn't report cough, dyspnea or wheezes Cardiovascular: Doesn't report palpitation, chest discomfort  Gastrointestinal:  Doesn't report nausea, constipation, diarrhea GU: Doesn't report incontinence Skin: Doesn't report skin rashes Neurological: Per HPI Musculoskeletal: Doesn't report joint pain Behavioral/Psych: Doesn't report anxiety  Physical Exam: Vitals:   02/15/21 0913  BP: 129/81  Pulse: 86  Resp: 18  Temp: 97.7 F (36.5 C)  SpO2: 100%    KPS: 70. General: Alert, cooperative, pleasant, in no acute distress Head: Normal EENT: No conjunctival injection  or scleral icterus.  Lungs: Resp effort normal Cardiac: Regular rate Abdomen: Non-distended abdomen Skin: No rashes cyanosis or petechiae. Extremities: No clubbing or edema  Neurologic Exam: Mental Status: Awake, alert, attentive to examiner. Oriented to self and environment. Language is fluent with intact comprehension.  Cranial Nerves: Visual acuity is grossly normal. Visual fields are full. Extra-ocular movements intact. No ptosis. Face is symmetric Motor: Tone and bulk are normal. 4-/5 in left arm and handm, 4+/5 in left leg. Reflexes are symmetric, no pathologic reflexes present.  Sensory: Intact to light touch Gait: Hemiparetic   Labs: I have reviewed the data as listed    Component Value Date/Time   NA 141 02/02/2021 1054   K 3.7 02/02/2021 1054   CL 107 02/02/2021 1054   CO2 27 02/02/2021 1054   GLUCOSE 105 (H) 02/02/2021 1054   BUN 10 02/02/2021 1054   CREATININE 0.79 02/02/2021 1054   CALCIUM 9.6 02/02/2021 1054   PROT 6.3 (L) 02/02/2021 1054   ALBUMIN 4.0 02/02/2021 1054   AST 17 02/02/2021 1054   ALT 31  02/02/2021 1054   ALKPHOS 45 02/02/2021 1054   BILITOT 0.5 02/02/2021 1054   GFRNONAA >60 02/02/2021 1054   Lab Results  Component Value Date   WBC 4.1 02/15/2021   NEUTROABS 3.2 02/15/2021   HGB 13.2 02/15/2021   HCT 39.7 02/15/2021   MCV 92.1 02/15/2021   PLT 171 02/15/2021    Assessment/Plan Glioblastoma, IDH-wildtype ([place])  Seizures ([place])  Erika Mitchell is clinically progressive today, now in final week of IMRT and Temodar.  We suspect recurrence of localized inflammation that was experienced prior to onset of RT/TMZ.    We recommended re-initiating decadron at $RemoveBef'4mg'FtvfSNnpqZ$  BID x7 days, $Remove'4mg'HZztVDV$  daily x7 days, then $RemoveBe'2mg'ESjrQcODO$  daily thereafter.  She will call us if clinical improvement is NOT experienced.   For seizures, Keppra should be increased to $RemoveBefo'1000mg'rfvLmarpVCi$  BID if tolerated.  We otherwise recommended completing course of intensity modulated radiation therapy.  Radiation will be administered Mon-Fri over 6 weeks.  Due to clinical changes above she will formally discontinue concurrent Temodar.  CARIS sequencing results were reviewed and shared at bedside today.  We ask that Benedict Needy return to clinic in 4 weeks with post-radiation MRI brain for evaluation, or sooner if clinical changes described today are persistent despite steroids.  All questions were answered. The patient knows to call the clinic with any problems, questions or concerns. No barriers to learning were detected.  The total time spent in the encounter was 40 minutes and more than 50% was on counseling and review of test results   Ventura Sellers, MD Medical Director of Neuro-Oncology [facility] 02/15/21 9:10 AM

## 2021-02-16 ENCOUNTER — Telehealth: Payer: Self-pay | Admitting: Internal Medicine

## 2021-02-16 ENCOUNTER — Ambulatory Visit
Admission: RE | Admit: 2021-02-16 | Discharge: 2021-02-16 | Disposition: A | Discharge status: Home / Self Care | Payer: BC Managed Care – PPO | Source: Ambulatory Visit | Attending: Radiation Oncology | Admitting: Radiation Oncology

## 2021-02-16 DIAGNOSIS — Z510 Encounter for antineoplastic radiation therapy: Secondary | ICD-10-CM | POA: Diagnosis not present

## 2021-02-16 DIAGNOSIS — C711 Malignant neoplasm of frontal lobe: Secondary | ICD-10-CM | POA: Diagnosis not present

## 2021-02-16 NOTE — Telephone Encounter (Signed)
Scheduled follow-up appointment per 1/9 los. Patient is aware. °

## 2021-02-17 ENCOUNTER — Other Ambulatory Visit: Payer: Self-pay

## 2021-02-17 ENCOUNTER — Ambulatory Visit
Admission: RE | Admit: 2021-02-17 | Discharge: 2021-02-17 | Disposition: A | Discharge status: Home / Self Care | Payer: BC Managed Care – PPO | Source: Ambulatory Visit | Attending: Radiation Oncology | Admitting: Radiation Oncology

## 2021-02-17 DIAGNOSIS — Z510 Encounter for antineoplastic radiation therapy: Secondary | ICD-10-CM | POA: Diagnosis not present

## 2021-02-17 DIAGNOSIS — C711 Malignant neoplasm of frontal lobe: Secondary | ICD-10-CM | POA: Diagnosis not present

## 2021-02-18 ENCOUNTER — Encounter: Payer: Self-pay | Admitting: Radiation Oncology

## 2021-02-18 ENCOUNTER — Other Ambulatory Visit (HOSPITAL_COMMUNITY): Payer: Self-pay

## 2021-02-18 ENCOUNTER — Ambulatory Visit
Admission: RE | Admit: 2021-02-18 | Discharge: 2021-02-18 | Disposition: A | Discharge status: Home / Self Care | Payer: BC Managed Care – PPO | Source: Ambulatory Visit | Attending: Radiation Oncology | Admitting: Radiation Oncology

## 2021-02-18 DIAGNOSIS — Z510 Encounter for antineoplastic radiation therapy: Secondary | ICD-10-CM | POA: Diagnosis not present

## 2021-02-18 DIAGNOSIS — C711 Malignant neoplasm of frontal lobe: Secondary | ICD-10-CM | POA: Diagnosis not present

## 2021-02-25 ENCOUNTER — Other Ambulatory Visit: Payer: Self-pay

## 2021-03-12 ENCOUNTER — Other Ambulatory Visit: Payer: Self-pay

## 2021-03-12 ENCOUNTER — Ambulatory Visit
Admission: RE | Admit: 2021-03-12 | Discharge: 2021-03-12 | Disposition: A | Discharge status: Home / Self Care | Payer: BC Managed Care – PPO | Source: Ambulatory Visit | Attending: Internal Medicine | Admitting: Internal Medicine

## 2021-03-12 DIAGNOSIS — Z9889 Other specified postprocedural states: Secondary | ICD-10-CM | POA: Diagnosis not present

## 2021-03-12 DIAGNOSIS — R220 Localized swelling, mass and lump, head: Secondary | ICD-10-CM | POA: Diagnosis not present

## 2021-03-12 DIAGNOSIS — C719 Malignant neoplasm of brain, unspecified: Secondary | ICD-10-CM

## 2021-03-12 IMAGING — MR MR HEAD WO/W CM
14 series · 48 of 48 positions shown · IV contrast (multihance)
Comparison: [DATE]

CLINICAL DATA: Brain/CNS neoplasm, assess treatment response;
glioblastoma

EXAM:
MRI HEAD WITHOUT AND WITH CONTRAST
TECHNIQUE: Multiplanar, multiecho pulse sequences of the brain and surrounding
structures were obtained without and with intravenous contrast.
CONTRAST:  15mL MULTIHANCE GADOBENATE DIMEGLUMINE 529 MG/ML IV SOLN

[Series 2: T1 · sagittal · 5.0mm · 0.49mm/px · 1 of 25 slices shown]
[im 1/25]
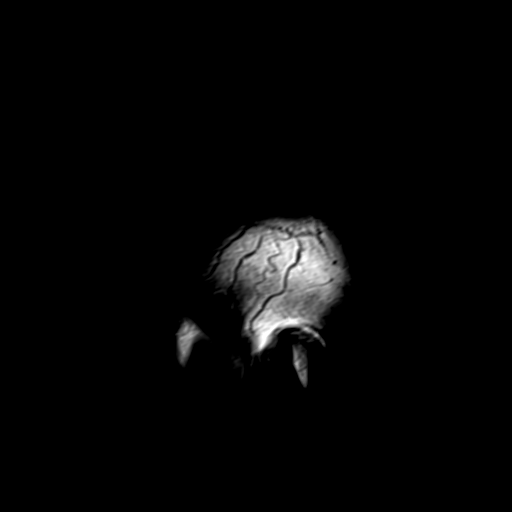

[Series 3: ax ep2d_diff_3 · axial · 3.0mm · 1.80mm/px · z∈[-98,+59]mm · 6 of 105 slices shown]
[im 1/105]
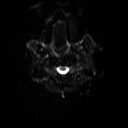
[im 21/105]
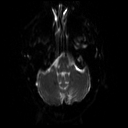
[im 42/105]
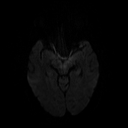
[im 63/105]
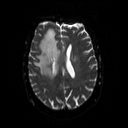
[im 84/105]
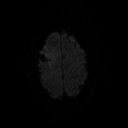
[im 105/105]
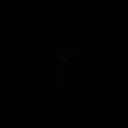

[Series 4: ax ep2d_diff_3_adc · axial · 3.0mm · 1.80mm/px · z∈[-98,+62]mm · 3 of 54 slices shown]
[im 1/54]
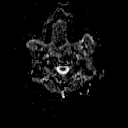
[im 27/54]
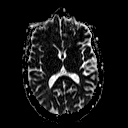
[im 54/54]
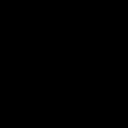

[Series 5: cor ep2d_diff · coronal · 5.0mm · 1.77mm/px · 3 of 61 slices shown]
[im 1/61]
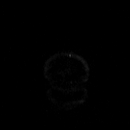
[im 31/61]
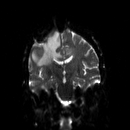
[im 61/61]
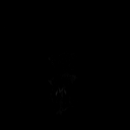

[Series 6: cor ep2d_diff_adc · coronal · 5.0mm · 1.77mm/px · 2 of 31 slices shown]
[im 1/31]
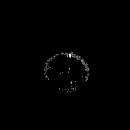
[im 31/31]
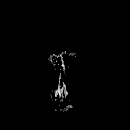

[Series 7: mip_images(sw) · axial · 16.0mm · 0.98mm/px · z∈[-84,+58]mm · 4 of 73 slices shown]
[im 1/73]
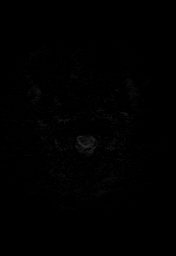
[im 25/73]
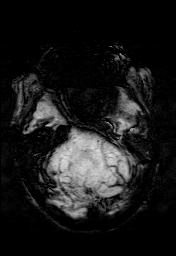
[im 49/73]
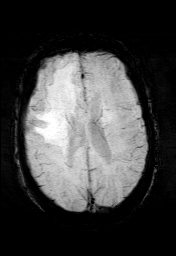
[im 73/73]
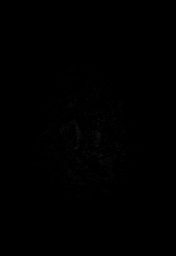

[Series 8: swi_images · axial · 2.0mm · 0.98mm/px · z∈[-91,+65]mm · 4 of 80 slices shown]
[im 1/80]
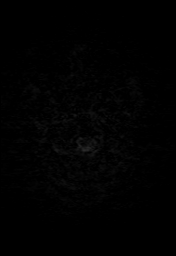
[im 27/80]
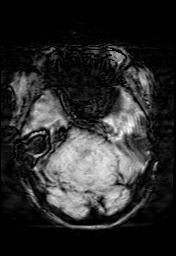
[im 53/80]
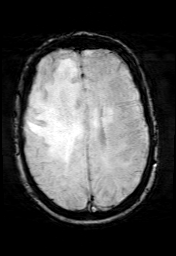
[im 80/80]
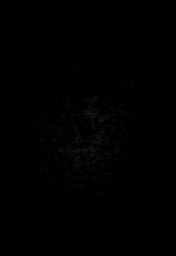

[Series 9: FLAIR · axial · 3.0mm · 0.43mm/px · z∈[-97,+65]mm · 2 of 43 slices shown]
[im 1/43]
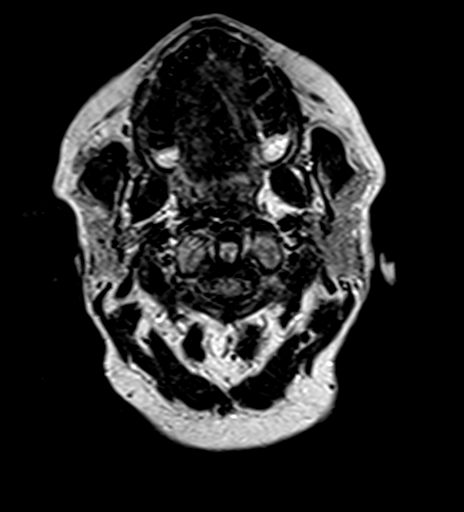
[im 43/43]
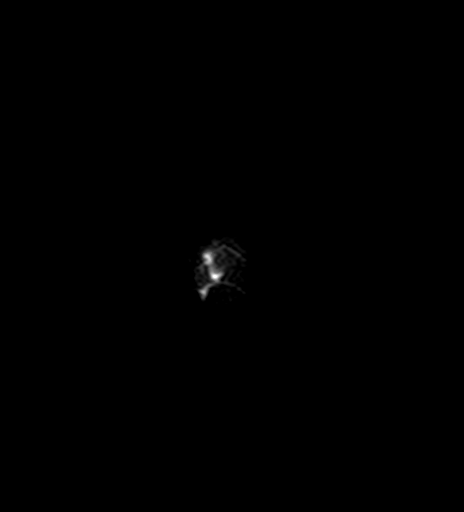

[Series 10: T2 · axial · 5.0mm · 0.65mm/px · z∈[-99,+73]mm · 2 of 30 slices shown (1 of 2)]
[im 1/30]
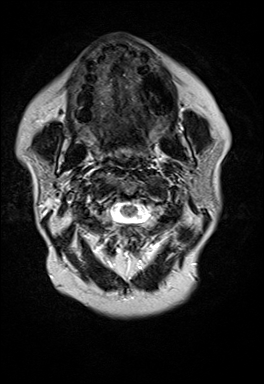
[im 30/30]
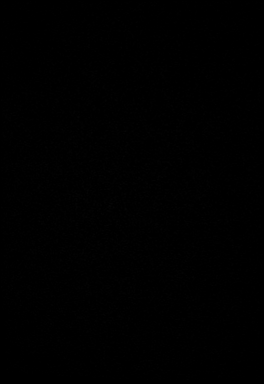

[Series 11: t1_mpr_tra · axial · 1.0mm · 0.78mm/px · z∈[-91,+65]mm · 8 of 160 slices shown]
[im 1/160]
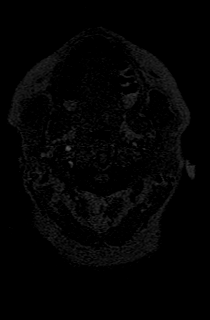
[im 23/160]
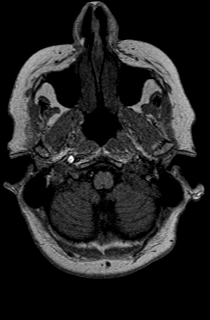
[im 46/160]
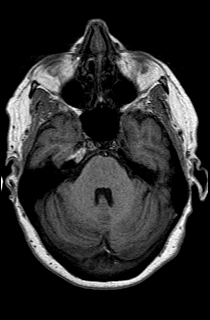
[im 69/160]
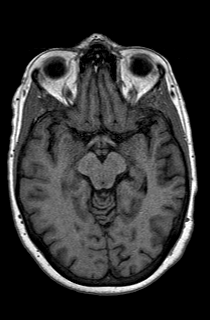
[im 91/160]
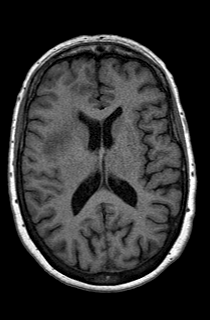
[im 114/160]
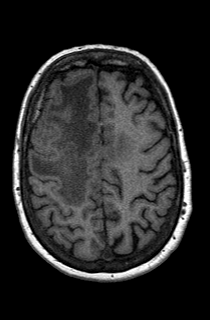
[im 137/160]
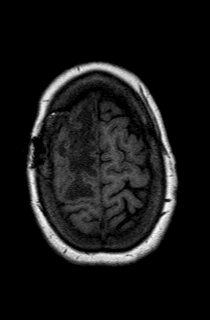
[im 160/160]
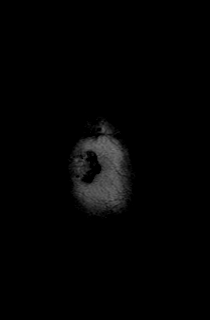

[Series 12: T2 · coronal · 5.0mm · 0.43mm/px · 2 of 33 slices shown (2 of 2)]
[im 1/33]
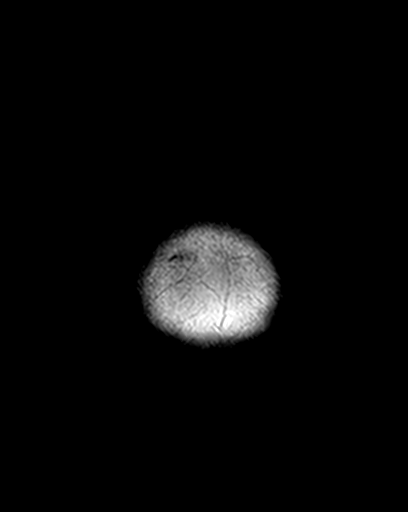
[im 33/33]
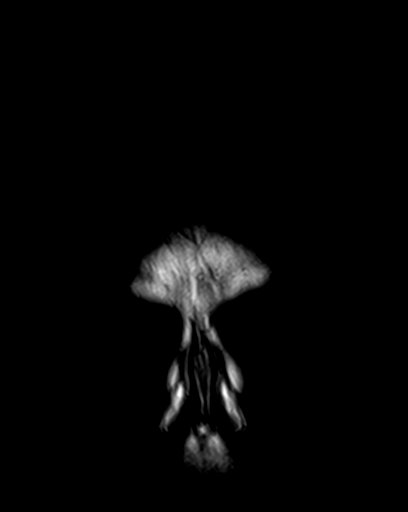

[Series 13: post t1_mpr_tra · axial · 1.0mm · 0.78mm/px · z∈[-91,+65]mm · 8 of 160 slices shown]
[im 1/160]
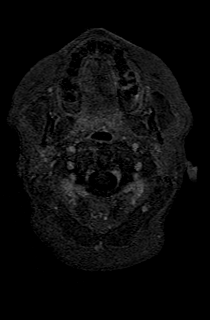
[im 23/160]
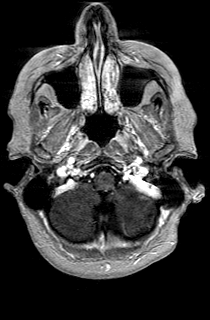
[im 46/160]
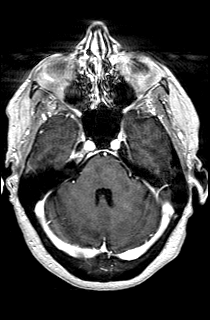
[im 69/160]
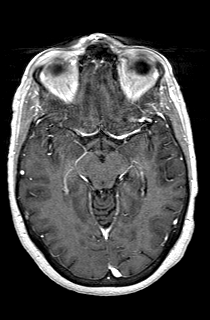
[im 91/160]
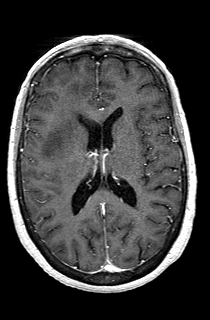
[im 114/160]
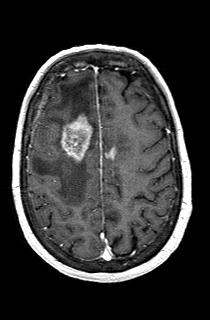
[im 137/160]
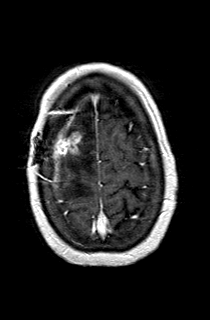
[im 160/160]
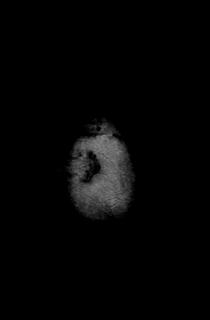

[Series 14: T1 post-contrast · coronal · 5.0mm · 0.72mm/px · 2 of 33 slices shown (1 of 2)]
[im 1/33]
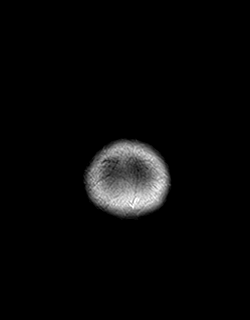
[im 33/33]
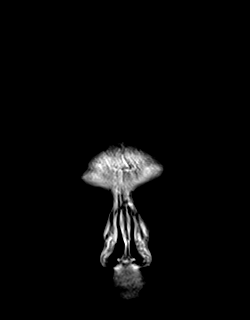

[Series 15: T1 post-contrast · sagittal · 5.0mm · 0.49mm/px · 1 of 25 slices shown (2 of 2)]
[im 1/25]
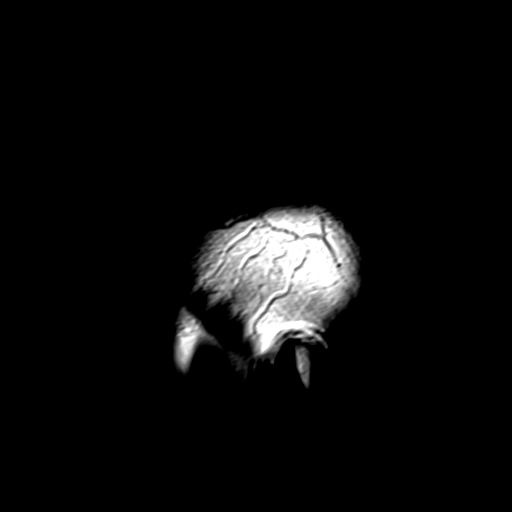

[48 of 48 positions shown; findings below may reference images not displayed]

FINDINGS: Brain: Postoperative changes are again identified in the right
frontal lobe. Substantially increased enhancement about the
resection cavity. The irregular enhancing lesion of the right
frontal lobe measures about 4 x 2.6 x 4 cm. Discontiguous
subcentimeter foci of enhancement along the body of the corpus
callosum (series 14, image 18) and right frontal white matter
(series 13, images 104 and 112). There is also new subcentimeter
contralateral enhancement in the left parasagittal frontal lobe and
along the ependymal margin of the body left lateral ventricle.

T2 FLAIR hyperintensity has significantly increased in the right
frontal lobe with some parietal extension as well as increased
involvement of the body of the corpus callosum. There is increased
T2 FLAIR hyperintensity in the contralateral parasagittal left
frontal region. Changes previously reported in the left thalamus are
not as apparent. Remains small focal cortical T2 hyperintensity in
the left frontal lobe without expansion (series 9, image 35).

Increased mass effect on the right greater than left lateral
ventricles.

No acute infarction.  No hydrocephalus.

Vascular: Major vessel flow voids at the skull base are preserved.

Skull and upper cervical spine: Normal marrow signal is preserved.
Right frontoparietal craniotomy.

Sinuses/Orbits: Paranasal sinuses are aerated. Orbits are
unremarkable.

Other: Sella is unremarkable.  Mastoid air cells are clear.
IMPRESSION: Substantially increased enhancement about the resection cavity with
some discontiguous but nearby new subcentimeter foci of enhancement.
New small foci of contralateral left frontal enhancement.
Significant increase in T2 FLAIR hyperintensity in the right frontal
lobe, along the body of the corpus callosum, and at the site of new
contralateral frontal enhancement.

## 2021-03-12 MED ORDER — GADOBENATE DIMEGLUMINE 529 MG/ML IV SOLN
15.0000 mL | Freq: Once | INTRAVENOUS | Status: AC | PRN
  Administered 2021-03-12: 15 mL via INTRAVENOUS

## 2021-03-15 ENCOUNTER — Telehealth: Payer: Self-pay | Admitting: *Deleted

## 2021-03-15 ENCOUNTER — Inpatient Hospital Stay: Payer: BC Managed Care – PPO | Attending: Internal Medicine

## 2021-03-15 DIAGNOSIS — R5383 Other fatigue: Secondary | ICD-10-CM | POA: Insufficient documentation

## 2021-03-15 DIAGNOSIS — C711 Malignant neoplasm of frontal lobe: Secondary | ICD-10-CM | POA: Insufficient documentation

## 2021-03-15 NOTE — Telephone Encounter (Signed)
Patient called after hours during the weekend.  She reports having had a seizure that lasted about 2-3 minutes with uncontrollably shaking. It woke her up when her arm started drawing up.  Patient states she was fully aware and it didn't impact any other part of her body.  She denies having any other seizures since then.  She is taking Keppra 1000mg  BID.  She is also taking Decadron 2 mg in the morning.  She still has weakness and is unable to raise the left arm much.  Unsure if her dose needs to be adjusted.   Routing to Dr Mickeal Skinner to please advise.

## 2021-03-18 ENCOUNTER — Telehealth: Payer: Self-pay | Admitting: Pharmacist

## 2021-03-18 ENCOUNTER — Inpatient Hospital Stay (HOSPITAL_BASED_OUTPATIENT_CLINIC_OR_DEPARTMENT_OTHER): Payer: BC Managed Care – PPO | Admitting: Internal Medicine

## 2021-03-18 ENCOUNTER — Other Ambulatory Visit (HOSPITAL_COMMUNITY): Payer: Self-pay

## 2021-03-18 ENCOUNTER — Other Ambulatory Visit: Payer: Self-pay

## 2021-03-18 ENCOUNTER — Ambulatory Visit: Payer: Self-pay | Admitting: Radiation Oncology

## 2021-03-18 VITALS — BP 133/68 | HR 71 | Temp 98.4°F | Resp 18 | Wt 177.2 lb

## 2021-03-18 DIAGNOSIS — R569 Unspecified convulsions: Secondary | ICD-10-CM | POA: Diagnosis not present

## 2021-03-18 DIAGNOSIS — C719 Malignant neoplasm of brain, unspecified: Secondary | ICD-10-CM

## 2021-03-18 DIAGNOSIS — C711 Malignant neoplasm of frontal lobe: Secondary | ICD-10-CM | POA: Diagnosis not present

## 2021-03-18 DIAGNOSIS — R5383 Other fatigue: Secondary | ICD-10-CM | POA: Diagnosis not present

## 2021-03-18 MED ORDER — ONDANSETRON HCL 8 MG PO TABS
8.0000 mg | ORAL_TABLET | Freq: Two times a day (BID) | ORAL | 1 refills | Status: AC | PRN
Start: 2021-03-18 — End: ?
  Filled 2021-03-18: qty 30, 15d supply, fill #0

## 2021-03-18 MED ORDER — DEXAMETHASONE 4 MG PO TABS: 4.0000 mg | ORAL_TABLET | Freq: Two times a day (BID) | ORAL | 1 refills | Status: DC

## 2021-03-18 MED ORDER — TEMOZOLOMIDE 100 MG PO CAPS
150.0000 mg/m2/d | ORAL_CAPSULE | Freq: Every day | ORAL | 0 refills | Status: DC
  Filled 2021-03-18: qty 15, 5d supply, fill #0
  Filled 2021-03-25: qty 15, 28d supply, fill #0
  Filled 2021-03-25: qty 15, 5d supply, fill #0

## 2021-03-18 NOTE — Progress Notes (Signed)
DISCONTINUE ON PATHWAY REGIMEN - Neuro     One cycle, concurrent with RT:     Temozolomide   **Always confirm dose/schedule in your pharmacy ordering system**  REASON: Continuation Of Treatment PRIOR TREATMENT: [id number]: Radiation Therapy with Concurrent Temozolomide 75 mg/m2 Daily x 6 Weeks, Followed by Adjuvant Temozolomide TREATMENT RESPONSE: Unable to Evaluate  START ON PATHWAY REGIMEN - Neuro     A cycle is every 28 days:     Temozolomide      Temozolomide   **Always confirm dose/schedule in your pharmacy ordering system**  Patient Characteristics: Glioma, Glioblastoma, IDH-wildtype, Newly Diagnosed / Treatment Naive, Good Performance Status and/or Younger Patient, MGMT Promoter Unmethylated/Unknown Disease Classification: Glioma Disease Classification: Glioblastoma, IDH-wildtype Disease Status: Newly Diagnosed / Treatment Naive Performance Status: Good Performance Status and/or Younger Patient MGMT Promoter Methylation Status: Awaiting Test Results Intent of Therapy: Non-Curative / Palliative Intent, Discussed with Patient 

## 2021-03-18 NOTE — Telephone Encounter (Addendum)
Oral Oncology Pharmacist Encounter  Received new prescription for Temodar (temozolomide) for the adjuvant maintenance treatment of glioblastoma multiforme, planned duration ~6-12 months.  CBC w/ Diff and CMP from 02/15/21 assessed, no relevant lab abnormalities noted. Prescription dose and frequency assessed for appropriateness. Per Dr. Mickeal Skinner, patient will not be started until after MD office visit scheduled for next week.   Current medication list in Epic reviewed, no relevant/significant DDIs with Temodar identified.  Evaluated chart and no patient barriers to medication adherence noted.   Patient agreement for treatment documented in MD note on 03/18/21.  Prescription has been e-scribed to the [facility] for benefits analysis and approval.  Oral Oncology Clinic will continue to follow for insurance authorization, copayment issues, initial counseling and start date.  Leron Croak, PharmD, BCPS Hematology/Oncology Clinical Pharmacist [facility] [phone] 03/18/2021 12:30 PM

## 2021-03-18 NOTE — Progress Notes (Signed)
[facility] [address], [place] [address] [phone]   Interval Evaluation  Date of Service: 03/18/21 Patient Name: Erika Mitchell Patient MRN: [id number] Patient DOB: 12/29/1955 Provider: Ventura Sellers, MD  Identifying Statement:  Erika Mitchell is a 66 y.o. female with right frontal glioblastoma   Oncologic History: Oncology History  Glioblastoma, IDH-wildtype ([place])  11/25/2020 Surgery   Craniotomy, resection with Dr. Zada Finders; path is c/w GBM, [facility]   12/04/2020 - 12/07/2020 Hospital Admission   Admitted for worsening left sided weakness, MRI demonstrates likely post-surgical localized inflammatory process.  Improves with corticosteroids.   01/06/2021 -  Radiation Therapy   IMRT and concurrent Temodar Erika Mitchell)     Biomarkers:  MGMT Unknown.  IDH 1/2 Wild type.  EGFR Unknown  TERT Unknown   Interval History: Erika Mitchell presents today for follow up, now having completed radiation therapy.  She describes worsening of left arm and hand weakness, worsening fatigue, and confusion over recent days.  This is similar to what she experienced at the end of radiation, and also prior to treatment.  Gait is also affected by new left leg weakness, though not as significant.  She had on focal seizure episode, left hand twitching.  Otherwise no new or novel complaints.  Decadron currently at $RemoveBefo'2mg'joPbYOvmYBO$  daily.  H+P (12/17/20) Patient presented on 11/23/20 with several episodes of sudden dysfunction of left arm and leg, lasting several minutes, with most recent episode accompanied by frank speech arrest while at work.  First episode was less than one week ago.  Between, she has been normal, at baseline.  CNS imaging demonstrated enhancing mass in the right frontal lobe.  She underwent craniotomy, resection with Dr. Zada Finders on 11/25/20, path demonstrated gliolblastoma.  On 10/28, she returned to the hospital with several days of  progressive left sided weakness.  After improvement back to baseline with steroids, she was discharged to home.  She has no new complaints currently, is scheduled for repeat MRI brain next week prior to radiation therapy.    Medications: Current Outpatient Medications on File Prior to Visit  Medication Sig Dispense Refill   acetaminophen (TYLENOL) 500 MG tablet Take 500 mg by mouth every 6 (six) hours as needed for moderate pain.     ALPRAZolam (XANAX) 0.25 MG tablet Take 4.5 tablets (1.125 mg total) by mouth daily as needed (Vestibular neuritis). Patient states she takes 4 of the 0.25 mg tablets and then splits one 0.25 mg tablet to make total of 1.125 mg daily for vestibular neuritis (Patient not taking: Reported on 12/17/2020)     diazepam (VALIUM) 5 MG tablet Take 5 mg by mouth daily as needed. For MRI (Patient not taking: Reported on 02/02/2021)     levETIRAcetam (KEPPRA) 1000 MG tablet Take 1 tablet (1,000 mg total) by mouth 2 (two) times daily. 60 tablet 3   ondansetron (ZOFRAN) 8 MG tablet Take 1 tablet (8 mg total) by mouth 3 (three) times daily as needed (nausea and vomiting). May take 30-60 minutes prior to Temodar administration if nausea/vomiting occurs. 30 tablet 1   telmisartan (MICARDIS) 40 MG tablet Take 40 mg by mouth daily.     temozolomide (TEMODAR) 140 MG capsule Take 1 capsule (140 mg total) by mouth daily. May take on an empty stomach to decrease nausea & vomiting. 42 capsule 0   No current facility-administered medications on file prior to visit.    Allergies:  Allergies  Allergen Reactions  Epinephrine Other (See Comments)    "dizziness, rapid heartbeat"   Azithromycin     dizziness   Motrin [Ibuprofen]     dizziness   Ciprofloxacin     Other reaction(s): vertigo   Sulfa Antibiotics     Other reaction(s): rash   Past Medical History:  Past Medical History:  Diagnosis Date   Cancer ([place])    basal cell carcinoma on left arm left eye brow   Hypertension     Vertigo    Vestibular neuritis    right ear   Past Surgical History:  Past Surgical History:  Procedure Laterality Date   APPLICATION OF CRANIAL NAVIGATION Right 11/25/2020   Procedure: APPLICATION OF CRANIAL NAVIGATION;  Surgeon: Judith Part, MD;  Location: [facility];  Service: Neurosurgery;  Laterality: Right;   CHOLECYSTECTOMY  2017   CRANIOTOMY Right 11/25/2020   Procedure: CRANIOTOMY FOR  TUMOR RESECTION WITH Lucky Rathke;  Surgeon: Judith Part, MD;  Location: [facility];  Service: Neurosurgery;  Laterality: Right;   laproscopy     surgery for endometriosisi   OOPHORECTOMY  1974&1990   rt then left ovary   Social History:  Social History   Socioeconomic History   Marital status: Married    Spouse name: [name]   Number of children: 1   Years of education: 14   Highest education level: Not on file  Occupational History    Employer: [employer]  Tobacco Use   Smoking status: Former    Types: Cigarettes    Quit date: 02/08/1996    Years since quitting: 25.1   Smokeless tobacco: Never  Vaping Use   Vaping Use: Never used  Substance and Sexual Activity   Alcohol use: Not Currently    Alcohol/week: 2.0 standard drinks    Types: 2 Glasses of wine per week   Drug use: No   Sexual activity: Not Currently  Other Topics Concern   Not on file  Social History Narrative   Patient is married [name]).     Patient is left-handed.   Patient is working full-time.   Patient has a college education (Associate's)   Patient has one child.   Patient drinks 3 Cups of coffee daily.   Social Determinants of Health   Financial Resource Strain: Not on file  Food Insecurity: Not on file  Transportation Needs: Not on file  Physical Activity: Not on file  Stress: Not on file  Social Connections: Not on file  Intimate Partner Violence: Not on file   Family History:  Family History  Problem Relation Age of Onset   Lung cancer Mother    Aneurysm Father    Heart disease  Sister    Atrial fibrillation Brother    Colon cancer Neg Hx     Review of Systems: Constitutional: Doesn't report fevers, chills or abnormal weight loss Eyes: Doesn't report blurriness of vision Ears, nose, mouth, throat, and face: Doesn't report sore throat Respiratory: Doesn't report cough, dyspnea or wheezes Cardiovascular: Doesn't report palpitation, chest discomfort  Gastrointestinal:  Doesn't report nausea, constipation, diarrhea GU: Doesn't report incontinence Skin: Doesn't report skin rashes Neurological: Per HPI Musculoskeletal: Doesn't report joint pain Behavioral/Psych: Doesn't report anxiety  Physical Exam: Vitals:   03/18/21 0931  BP: 133/68  Pulse: 71  Resp: 18  Temp: 98.4 F (36.9 C)  SpO2: 100%   KPS: 70. General: Alert, cooperative, pleasant, in no acute distress Head: Normal EENT: No conjunctival injection or scleral icterus.  Lungs: Resp effort normal Cardiac:  Regular rate Abdomen: Non-distended abdomen Skin: No rashes cyanosis or petechiae. Extremities: No clubbing or edema  Neurologic Exam: Mental Status: Awake, alert, attentive to examiner. Oriented to self and environment. Language is fluent with intact comprehension.  Cranial Nerves: Visual acuity is grossly normal. Visual fields are full. Extra-ocular movements intact. No ptosis. Face is symmetric Motor: Tone and bulk are normal. 4-/5 in left arm and handm, 4+/5 in left leg. Reflexes are symmetric, no pathologic reflexes present.  Sensory: Intact to light touch Gait: Hemiparetic   Labs: I have reviewed the data as listed    Component Value Date/Time   NA 140 02/15/2021 0856   K 3.8 02/15/2021 0856   CL 109 02/15/2021 0856   CO2 24 02/15/2021 0856   GLUCOSE 97 02/15/2021 0856   BUN 11 02/15/2021 0856   CREATININE 0.87 02/15/2021 0856   CALCIUM 9.4 02/15/2021 0856   PROT 6.3 (L) 02/15/2021 0856   ALBUMIN 4.0 02/15/2021 0856   AST 16 02/15/2021 0856   ALT 21 02/15/2021 0856    ALKPHOS 44 02/15/2021 0856   BILITOT 0.7 02/15/2021 0856   GFRNONAA >60 02/15/2021 0856   Lab Results  Component Value Date   WBC 4.1 02/15/2021   NEUTROABS 3.2 02/15/2021   HGB 13.2 02/15/2021   HCT 39.7 02/15/2021   MCV 92.1 02/15/2021   PLT 171 02/15/2021   Imaging:  [facility] Clinician Interpretation: I have personally reviewed the CNS images as listed.  My interpretation, in the context of the patient's clinical presentation, is treatment effect vs true progression  MR BRAIN W WO CONTRAST  Result Date: 03/12/2021 CLINICAL DATA:  Brain/CNS neoplasm, assess treatment response; glioblastoma EXAM: MRI HEAD WITHOUT AND WITH CONTRAST TECHNIQUE: Multiplanar, multiecho pulse sequences of the brain and surrounding structures were obtained without and with intravenous contrast. CONTRAST:  73mL MULTIHANCE GADOBENATE DIMEGLUMINE 529 MG/ML IV SOLN COMPARISON:  12/24/2020 FINDINGS: Brain: Postoperative changes are again identified in the right frontal lobe. Substantially increased enhancement about the resection cavity. The irregular enhancing lesion of the right frontal lobe measures about 4 x 2.6 x 4 cm. Discontiguous subcentimeter foci of enhancement along the body of the corpus callosum (series 14, image 18) and right frontal white matter (series 13, images 104 and 112). There is also new subcentimeter contralateral enhancement in the left parasagittal frontal lobe and along the ependymal margin of the body left lateral ventricle. T2 FLAIR hyperintensity has significantly increased in the right frontal lobe with some parietal extension as well as increased involvement of the body of the corpus callosum. There is increased T2 FLAIR hyperintensity in the contralateral parasagittal left frontal region. Changes previously reported in the left thalamus are not as apparent. Remains small focal cortical T2 hyperintensity in the left frontal lobe without expansion (series 9, image 35). Increased mass effect on the  right greater than left lateral ventricles. No acute infarction.  No hydrocephalus. Vascular: Major vessel flow voids at the skull base are preserved. Skull and upper cervical spine: Normal marrow signal is preserved. Right frontoparietal craniotomy. Sinuses/Orbits: Paranasal sinuses are aerated. Orbits are unremarkable. Other: Sella is unremarkable.  Mastoid air cells are clear. IMPRESSION: Substantially increased enhancement about the resection cavity with some discontiguous but nearby new subcentimeter foci of enhancement. New small foci of contralateral left frontal enhancement. Significant increase in T2 FLAIR hyperintensity in the right frontal lobe, along the body of the corpus callosum, and at the site of new contralateral frontal enhancement. Electronically Signed   By: Macy Mis  M.D.   On: 03/12/2021 14:04    Assessment/Plan Glioblastoma, IDH-wildtype ([place])  Seizures ([place])  Erika Mitchell is again clinically progressive today, now having completed IMRT and Temodar.  MRI brain demonstrates significant burden of edema within right hemisphere.  Increase in burden of enhancement is noted within high dose RT field; etiology is favored pseudoprogression over organic tumor progression.  We recommended re-initiating decadron at $RemoveBef'4mg'IhIijLBPZV$  BID.  We will call her in a week to assess response.  Could consider avastin if not improved.  For seizures, Keppra should maintain $RemoveBeforeD'1000mg'UaIpaBAnGsGqJQ$  BID if tolerated.  We also recommended initiating treatment with Temozolomide 200 mg/m2, on for five days and off for twenty three days in twenty eight day cycles. The patient will have a complete blood count performed on days 21 and 28 of each cycle, and a comprehensive metabolic panel performed on day 28 of each cycle. Labs may need to be performed more often. Zofran will prescribed for home use for nausea/vomiting.   Informed consent was obtained verbally at bedside to proceed with oral chemotherapy.  Chemotherapy should be  held for the following:  ANC less than 1,000  Platelets less than 100,000  LFT or creatinine greater than 2x ULN  If clinical concerns/contraindications develop  Onset of chemotherapy will be deferred one week given clinical decline noted today, resumption of higher dose steroids.  We ask that Erika Mitchell return to clinic in 5 weeks with labs for review prior to cycle #2.  All questions were answered. The patient knows to call the clinic with any problems, questions or concerns. No barriers to learning were detected.  The total time spent in the encounter was 40 minutes and more than 50% was on counseling and review of test results   Ventura Sellers, MD Medical Director of Neuro-Oncology [facility] 03/18/21 9:42 AM

## 2021-03-19 ENCOUNTER — Other Ambulatory Visit (HOSPITAL_COMMUNITY): Payer: Self-pay

## 2021-03-19 ENCOUNTER — Telehealth: Payer: Self-pay | Admitting: Internal Medicine

## 2021-03-19 NOTE — Telephone Encounter (Signed)
Scheduled per 2/9 los, pt husband has been called and confirmed

## 2021-03-23 ENCOUNTER — Other Ambulatory Visit (HOSPITAL_COMMUNITY): Payer: Self-pay

## 2021-03-23 ENCOUNTER — Ambulatory Visit
Admission: RE | Admit: 2021-03-23 | Discharge: 2021-03-23 | Disposition: A | Discharge status: Home / Self Care | Payer: BC Managed Care – PPO | Source: Ambulatory Visit | Attending: Radiation Oncology | Admitting: Radiation Oncology

## 2021-03-23 ENCOUNTER — Other Ambulatory Visit: Payer: Self-pay

## 2021-03-23 ENCOUNTER — Encounter: Payer: Self-pay | Admitting: Radiation Oncology

## 2021-03-23 VITALS — BP 130/74 | HR 72 | Temp 97.1°F | Resp 18 | Ht 67.0 in | Wt 180.1 lb

## 2021-03-23 DIAGNOSIS — C711 Malignant neoplasm of frontal lobe: Secondary | ICD-10-CM | POA: Diagnosis not present

## 2021-03-23 DIAGNOSIS — Z923 Personal history of irradiation: Secondary | ICD-10-CM | POA: Diagnosis not present

## 2021-03-23 DIAGNOSIS — Z79899 Other long term (current) drug therapy: Secondary | ICD-10-CM | POA: Diagnosis not present

## 2021-03-23 DIAGNOSIS — C719 Malignant neoplasm of brain, unspecified: Secondary | ICD-10-CM

## 2021-03-23 NOTE — Progress Notes (Signed)
Erika Mitchell presents today for follow-up after completing radiation to her right frontal brain on 02/18/2021  Dose of Decadron, if applicable: 4mg  PO twice a day  Recent neurologic symptoms, if any:  Seizures: Reports she experienced left sided twitching/involuntary movement earlier in the month. States episode lasted several minutes, but eventually resolved on its own. Dr. Mickeal Skinner made aware at her most recent appointment with him on 03/18/21 Headaches: Patient denies Nausea: Patient denies--reports this issue has been well controlled with the steroid Dizziness/ataxia: Patient denies Difficulty with hand coordination: Patient denies Focal numbness/weakness: Reports numbness/weakness to both her left arm and left foot. States ambulating can be cumbersome because she has to move like she has foot drop Visual deficits/changes: Patient denies Confusion/Memory deficits: Patient and husband report worsening short term memory (patient frequently forgets what she went into a room for, or has trouble finding her words)  Additional Complaints / other details: Overall, reports she feels well. Currently her main concern is the persistent fatigue--continues to manage with naps in the afternoon

## 2021-03-23 NOTE — Progress Notes (Signed)
Radiation Oncology         [phone] ________________________________  Name: Erika Mitchell MRN: [id number]  Date: 03/23/2021  DOB: 05-Aug-1955  Follow-Up Visit Note  Outpatient  CC: Erika Mitchell., MD  Erika Part, MD  Diagnosis and Prior Radiotherapy: C71.1   ICD-10-CM   1. Glioblastoma, IDH-wildtype ([place])  C71.9      2. Malignant neoplasm of frontal lobe of brain ([facility])  C71.1      Glioblastoma, IDH-wild-type, WHO grade 4   CHIEF COMPLAINT: Here for follow-up and surveillance of brain cancer  Narrative:  The patient returns today for routine follow-up.  Erika Mitchell presents today for follow-up after completing radiation to her right frontal brain on 02/18/2021  Dose of Decadron, if applicable: 96m PO twice a day  Recent neurologic symptoms, if any:  Seizures: Reports she experienced left sided twitching/involuntary movement earlier in the month. States episode lasted several minutes, but eventually resolved on its own. Dr. VMickeal Skinnermade aware at her most recent appointment with him on 03/18/21 Headaches: Patient denies Nausea: Patient denies--reports this issue has been well controlled with the steroid Dizziness/ataxia: Patient denies Difficulty with hand coordination: Patient denies Focal numbness/weakness: Reports numbness/weakness to both her left arm and left foot. States ambulating can be cumbersome because she has to move like she has foot drop Visual deficits/changes: Patient denies Confusion/Memory deficits: Patient and husband report worsening short term memory (patient frequently forgets what she went into a room for, or has trouble finding her words)  Additional Complaints / other details: Overall, reports she feels well. Currently her main concern is the persistent fatigue--continues to manage with naps in the afternoon.Also has trouble with simple tasks, like making coffee.                             ALLERGIES:  is allergic to epinephrine,  azithromycin, motrin [ibuprofen], ciprofloxacin, and sulfa antibiotics.  Meds: Current Outpatient Medications  Medication Sig Dispense Refill   acetaminophen (TYLENOL) 500 MG tablet Take 500 mg by mouth every 6 (six) hours as needed for moderate pain.     ALPRAZolam (XANAX) 0.25 MG tablet Take 4.5 tablets (1.125 mg total) by mouth daily as needed (Vestibular neuritis). Patient states she takes 4 of the 0.25 mg tablets and then splits one 0.25 mg tablet to make total of 1.125 mg daily for vestibular neuritis (Patient not taking: Reported on 12/17/2020)     dexamethasone (DECADRON) 4 MG tablet Take 1 tablet (4 mg total) by mouth 2 (two) times daily with a meal. 60 tablet 1   diazepam (VALIUM) 5 MG tablet Take 5 mg by mouth daily as needed. For MRI (Patient not taking: Reported on 02/02/2021)     levETIRAcetam (KEPPRA) 1000 MG tablet Take 1 tablet (1,000 mg total) by mouth 2 (two) times daily. 60 tablet 3   ondansetron (ZOFRAN) 8 MG tablet Take 1 tablet (8 mg total) by mouth 2 (two) times daily as needed (nausea and vomiting). May take 30-60 minutes prior to Temodar administration if nausea/vomiting occurs. 30 tablet 1   telmisartan (MICARDIS) 40 MG tablet Take 40 mg by mouth daily.     temozolomide (TEMODAR) 100 MG capsule Take 3 capsules (300 mg total) by mouth daily. May take on an empty stomach to decrease nausea & vomiting. 15 capsule 0   No current facility-administered medications for this encounter.    Physical Findings: The patient is in no acute  distress. Patient is alert and providing good history.   height is _0  (1.702 m) and weight is 180 lb 2 oz (81.7 kg). Her temporal temperature is 97.1 F (36.2 C) (abnormal). Her blood pressure is 130/74 and her pulse is 72. Her respiration is 18 and oxygen saturation is 100%. Marland Kitchen    HEENT: No oral thrush.   Skin: mild erythema and alopecia of scalp. Skin is intact, no peeling. Neuro: Patient is alert and providing good history.  Speech is not  slurred.   Lab Findings: Lab Results  Component Value Date   WBC 4.1 02/15/2021   HGB 13.2 02/15/2021   HCT 39.7 02/15/2021   MCV 92.1 02/15/2021   PLT 171 02/15/2021    Radiographic Findings: MR BRAIN W WO CONTRAST  Result Date: 03/12/2021 CLINICAL DATA:  Brain/CNS neoplasm, assess treatment response; glioblastoma EXAM: MRI HEAD WITHOUT AND WITH CONTRAST TECHNIQUE: Multiplanar, multiecho pulse sequences of the brain and surrounding structures were obtained without and with intravenous contrast. CONTRAST:  66m MULTIHANCE GADOBENATE DIMEGLUMINE 529 MG/ML IV SOLN COMPARISON:  12/24/2020 FINDINGS: Brain: Postoperative changes are again identified in the right frontal lobe. Substantially increased enhancement about the resection cavity. The irregular enhancing lesion of the right frontal lobe measures about 4 x 2.6 x 4 cm. Discontiguous subcentimeter foci of enhancement along the body of the corpus callosum (series 14, image 18) and right frontal white matter (series 13, images 104 and 112). There is also new subcentimeter contralateral enhancement in the left parasagittal frontal lobe and along the ependymal margin of the body left lateral ventricle. T2 FLAIR hyperintensity has significantly increased in the right frontal lobe with some parietal extension as well as increased involvement of the body of the corpus callosum. There is increased T2 FLAIR hyperintensity in the contralateral parasagittal left frontal region. Changes previously reported in the left thalamus are not as apparent. Remains small focal cortical T2 hyperintensity in the left frontal lobe without expansion (series 9, image 35). Increased mass effect on the right greater than left lateral ventricles. No acute infarction.  No hydrocephalus. Vascular: Major vessel flow voids at the skull base are preserved. Skull and upper cervical spine: Normal marrow signal is preserved. Right frontoparietal craniotomy. Sinuses/Orbits: Paranasal  sinuses are aerated. Orbits are unremarkable. Other: Sella is unremarkable.  Mastoid air cells are clear. IMPRESSION: Substantially increased enhancement about the resection cavity with some discontiguous but nearby new subcentimeter foci of enhancement. New small foci of contralateral left frontal enhancement. Significant increase in T2 FLAIR hyperintensity in the right frontal lobe, along the body of the corpus callosum, and at the site of new contralateral frontal enhancement. Electronically Signed   By: PMacy MisM.D.   On: 03/12/2021 14:04    Impression/Plan:  Today, I talked to the patient and her husband about the findings and recent MRI of her brain -  I personally showed her images.  There appears to be inflammatory response vs progression in the RT fields.  We discussed the importance of continuing her meds as Rx'd by Dr VMickeal Skinnerand the possibility of resuming Temozolomide vs starting Avastin in the future. They are mentally prepared for likelihood of salvage systemic therapy.  She will continue MRI surveillance at the direction of Dr. VMickeal Skinner I will give feedback at CNS board and see her back PRN.  Discussed cognitive rehab services w/ OT - she would like to defer that for now.    On date of service, in total, I spent 20 minutes on  this encounter. Patient was seen in person.  _____________________________________   Eppie Gibson, MD

## 2021-03-24 ENCOUNTER — Encounter: Payer: Self-pay | Admitting: Radiation Oncology

## 2021-03-24 ENCOUNTER — Other Ambulatory Visit (HOSPITAL_COMMUNITY): Payer: Self-pay

## 2021-03-25 ENCOUNTER — Inpatient Hospital Stay (HOSPITAL_BASED_OUTPATIENT_CLINIC_OR_DEPARTMENT_OTHER): Payer: BC Managed Care – PPO | Admitting: Internal Medicine

## 2021-03-25 ENCOUNTER — Other Ambulatory Visit (HOSPITAL_COMMUNITY): Payer: Self-pay

## 2021-03-25 DIAGNOSIS — C719 Malignant neoplasm of brain, unspecified: Secondary | ICD-10-CM

## 2021-03-25 DIAGNOSIS — Z79630 Long term (current) use of alkylating agent: Secondary | ICD-10-CM | POA: Diagnosis not present

## 2021-03-25 NOTE — Progress Notes (Signed)
I connected with Erika Mitchell on 03/25/21 at  2:30 PM EST by telephone visit and verified that I am speaking with the correct person using two identifiers.  I discussed the limitations, risks, security and privacy concerns of performing an evaluation and management service by telemedicine and the availability of in-person appointments. I also discussed with the patient that there may be a patient responsible charge related to this service. The patient expressed understanding and agreed to proceed.   Other persons participating in the visit and their role in the encounter:  husband   Patient's location:  Home  Provider's location:  Office  Chief Complaint:  Glioblastoma, IDH-wildtype ([place])  History of Present Ilness: Erika Mitchell describes some improvement in functional status since increasing the decadron to 15m twice per day.  Her left side is dragging less than before, she also describes some "sharpening" of her cognition.  Overall she still remains quite tired, lethargic and tremulous.  No new deficits otherwise, no further seizures. Observations: Language and cognition at baseline Assessment and Plan: Glioblastoma, IDH-wildtype ([place]  Clinically improved.  Recommended continuing with dosing cycle #1 Temodar as discussed in prior visit. Once complete and back to baseline, will decrease decadron down to 441mdaily  Follow Up Instructions: RTC in 4 weeks with labs prior to cycle #2.  I discussed the assessment and treatment plan with the patient.  The patient was provided an opportunity to ask questions and all were answered.  The patient agreed with the plan and demonstrated understanding of the instructions.    The patient was advised to call back or seek an in-person evaluation if the symptoms worsen or if the condition fails to improve as anticipated.  I provided 5-10 minutes of non-face-to-face time during this enocunter.  ZaVentura SellersMD   I provided 22 minutes of non  face-to-face telephone visit time during this encounter, and > 50% was spent counseling as documented under my assessment & plan.

## 2021-03-26 ENCOUNTER — Telehealth: Payer: Self-pay | Admitting: Internal Medicine

## 2021-03-26 ENCOUNTER — Other Ambulatory Visit (HOSPITAL_COMMUNITY): Payer: Self-pay

## 2021-03-26 NOTE — Telephone Encounter (Signed)
.  Called patient to schedule appointment per 2/16 los, patient is aware of date and time.

## 2021-03-26 NOTE — Telephone Encounter (Signed)
Oral Chemotherapy Pharmacist Encounter  Medication will be delivered and patient will start taking the medication on Monday, 2/20.  I spoke with patient's husband, Erika Mitchell, for overview of: Temodar (temozolomide) for the maintenance treatment of glioblastoma multiforme, planned duration 6-12 months of treatment.  Counseled on administration, dosing, side effects, monitoring, drug-food interactions, safe handling, storage, and disposal.  Patient will take Temodar three 100 mg capsules, 300 mg total daily dose, by mouth once daily, may take at bedtime and on an empty stomach to decrease nausea and vomiting.  If 1st cycle is well tolerated, her husband was informed that Temodar dose may be increased to 200 mg/m2 daily for 5 days on, 23 days off, repeated every 28 days for subsequent cycles   Patient will take Temodar daily for 5 days on, 23 days off, and repeated.  Temodar start date: 03/29/21   Patient will take Zofran 8mg  tablet, 1 tablet by mouth 30-60 min prior to Temodar dose to help decrease N/V.   Adverse effects include but are not limited to: nausea, vomiting, anorexia, GI upset, rash, drug fever, and fatigue. Rare but serious adverse effects of pneumocystis pneumonia and secondary malignancy also discussed.  We discussed strategies to manage constipation if they occur secondary to ondansetron dosing.  PCP prophylaxis will not be initiated at this time, but may be added based on lymphocyte count in the future.  Reviewed importance of keeping a medication schedule and plan for any missed doses. No barriers to medication adherence identified.  Medication reconciliation performed and medication/allergy list updated.  Insurance authorization for Temodar has been obtained. Test claim at the pharmacy revealed copayment $0 for 1st fill of Temodar. This will ship from the [facility] outpatient pharmacy to deliver to patient's home on 03/29/21.  Patient informed the pharmacy will reach  out 5-7 days prior to needing next fill of Temodar to coordinate continued medication acquisition to prevent break in therapy.  All questions answered.  Mr. Mckellips voiced understanding and appreciation.   Medication education handout placed in mail for patient. Patient knows to call the office with questions or concerns. Oral Chemotherapy Clinic phone number provided to patient.   Benn Moulder, PharmD Pharmacy Resident  03/26/2021 11:24 AM

## 2021-04-06 ENCOUNTER — Encounter: Payer: Self-pay | Admitting: Internal Medicine

## 2021-04-06 NOTE — Progress Notes (Signed)
° °  Radiation Oncology         [phone] ________________________________  Name: LETTICIA BHATTACHARYYA MRN: [id number]  Date: 02/18/2021  DOB: [birth year]  End of Treatment Note  Diagnosis:    Glioblastoma   Indication for treatment:  aggressive local control       Radiation treatment dates:   01/06/21 to 02/18/21  Site/dose:    1) Right frontal lobe tumor bed initial (also including T2 Flair volume contralaterally) / 46 Gy in 23 fractions 2) Right frontal lobe tumor bed boost / 14 Gy in 7 fractions  Beams/energy:    1) IMRT / VMAT // 6MV 2) IMRT / VMAT // 6MV  Narrative: The patient tolerated radiation treatment relatively well.     Plan: The patient has completed radiation treatment. The patient will return to radiation oncology clinic for routine followup in one month. I advised them to call or return sooner if they have any questions or concerns related to their recovery or treatment.  -----------------------------------  Eppie Gibson, MD

## 2021-04-16 ENCOUNTER — Other Ambulatory Visit (HOSPITAL_COMMUNITY): Payer: Self-pay

## 2021-04-22 ENCOUNTER — Other Ambulatory Visit: Payer: Self-pay

## 2021-04-22 ENCOUNTER — Inpatient Hospital Stay (HOSPITAL_BASED_OUTPATIENT_CLINIC_OR_DEPARTMENT_OTHER): Payer: BC Managed Care – PPO | Admitting: Internal Medicine

## 2021-04-22 ENCOUNTER — Other Ambulatory Visit (HOSPITAL_COMMUNITY): Payer: Self-pay

## 2021-04-22 ENCOUNTER — Inpatient Hospital Stay: Payer: BC Managed Care – PPO | Attending: Radiation Oncology

## 2021-04-22 VITALS — BP 125/67 | HR 65 | Temp 98.0°F | Resp 17 | Wt 187.1 lb

## 2021-04-22 DIAGNOSIS — Z79899 Other long term (current) drug therapy: Secondary | ICD-10-CM | POA: Insufficient documentation

## 2021-04-22 DIAGNOSIS — R531 Weakness: Secondary | ICD-10-CM | POA: Diagnosis not present

## 2021-04-22 DIAGNOSIS — R569 Unspecified convulsions: Secondary | ICD-10-CM | POA: Diagnosis not present

## 2021-04-22 DIAGNOSIS — Z5112 Encounter for antineoplastic immunotherapy: Secondary | ICD-10-CM | POA: Diagnosis not present

## 2021-04-22 DIAGNOSIS — C719 Malignant neoplasm of brain, unspecified: Secondary | ICD-10-CM | POA: Diagnosis not present

## 2021-04-22 DIAGNOSIS — C711 Malignant neoplasm of frontal lobe: Secondary | ICD-10-CM | POA: Insufficient documentation

## 2021-04-22 LAB — CBC WITH DIFFERENTIAL (CANCER CENTER ONLY)
Abs Immature Granulocytes: 0.08 10*3/uL — ABNORMAL HIGH (ref 0.00–0.07)
Basophils Absolute: 0 10*3/uL (ref 0.0–0.1)
Basophils Relative: 1 %
Eosinophils Absolute: 0 10*3/uL (ref 0.0–0.5)
Eosinophils Relative: 1 %
HCT: 41.8 % (ref 36.0–46.0)
Hemoglobin: 13.6 g/dL (ref 12.0–15.0)
Immature Granulocytes: 1 %
Lymphocytes Relative: 25 %
Lymphs Abs: 1.4 10*3/uL (ref 0.7–4.0)
MCH: 30.4 pg (ref 26.0–34.0)
MCHC: 32.5 g/dL (ref 30.0–36.0)
MCV: 93.3 fL (ref 80.0–100.0)
Monocytes Absolute: 0.5 10*3/uL (ref 0.1–1.0)
Monocytes Relative: 8 %
Neutro Abs: 3.7 10*3/uL (ref 1.7–7.7)
Neutrophils Relative %: 64 %
Platelet Count: 188 10*3/uL (ref 150–400)
RBC: 4.48 MIL/uL (ref 3.87–5.11)
RDW: 12.6 % (ref 11.5–15.5)
WBC Count: 5.7 10*3/uL (ref 4.0–10.5)
nRBC: 0 % (ref 0.0–0.2)

## 2021-04-22 LAB — CMP (CANCER CENTER ONLY)
ALT: 41 U/L (ref 0–44)
AST: 17 U/L (ref 15–41)
Albumin: 4 g/dL (ref 3.5–5.0)
Alkaline Phosphatase: 33 U/L — ABNORMAL LOW (ref 38–126)
Anion gap: 5 (ref 5–15)
BUN: 20 mg/dL (ref 8–23)
CO2: 29 mmol/L (ref 22–32)
Calcium: 9.2 mg/dL (ref 8.9–10.3)
Chloride: 107 mmol/L (ref 98–111)
Creatinine: 0.89 mg/dL (ref 0.44–1.00)
GFR, Estimated: >60 mL/min (ref >60)
Glucose, Bld: 77 mg/dL (ref 70–99)
Potassium: 3.4 mmol/L — ABNORMAL LOW (ref 3.5–5.1)
Sodium: 141 mmol/L (ref 135–145)
Total Bilirubin: 0.5 mg/dL (ref 0.3–1.2)
Total Protein: 6.2 g/dL — ABNORMAL LOW (ref 6.5–8.1)

## 2021-04-22 MED ORDER — TEMOZOLOMIDE 100 MG PO CAPS
200.0000 mg/m2/d | ORAL_CAPSULE | Freq: Every day | ORAL | 0 refills | Status: DC
  Filled 2021-04-22: qty 20, 28d supply, fill #0

## 2021-04-22 MED ORDER — DIAZEPAM 5 MG PO TABS: 5.0000 mg | ORAL_TABLET | Freq: Every day | ORAL | 0 refills | Status: DC | PRN

## 2021-04-22 MED ORDER — DEXAMETHASONE 1 MG PO TABS: 2.0000 mg | ORAL_TABLET | Freq: Every day | ORAL | 1 refills | Status: DC

## 2021-04-22 NOTE — Progress Notes (Signed)
? ?[facility] [address]  ?[place], [place] [address] ?[phone] ? ? ?Interval Evaluation ? ?Date of Service: 04/22/21 ?Patient Name: Erika Mitchell ?Patient MRN: [id number] ?Patient DOB: Aug 24, 1955 ?Provider: Ventura Sellers, MD ? ?Identifying Statement:  ?Erika Mitchell is a 66 y.o. female with right frontal glioblastoma  ? ?Oncologic History: ?Oncology History  ?Glioblastoma, IDH-wildtype ([place])  ?11/25/2020 Surgery  ? Craniotomy, resection with Dr. Zada Finders; path is c/w GBM, IDHwt ?  ?12/04/2020 - 12/07/2020 Hospital Admission  ? Admitted for worsening left sided weakness, MRI demonstrates likely post-surgical localized inflammatory process.  Improves with corticosteroids. ?  ?01/06/2021 - 02/18/2021 Radiation Therapy  ? IMRT and concurrent Temodar Isidore Moos) ?  ?03/25/2021 -  Chemotherapy  ? Patient is on Treatment Plan : BRAIN GLIOBLASTOMA Consolidation Temozolomide Days 1-5 q28 Days   ?   ? ? ?Biomarkers: ? ?MGMT Unknown.  ?IDH 1/2 Wild type.  ?EGFR Unknown  ?TERT Unknown  ? ?Interval History: ?Erika Mitchell presents today for follow up, now having completed first cycle of adjuvant Temodar.  No recurrence of left sided weakness, seizures.  Will have some mild headaches.  Energy level is still low, memory is not sharp.  Otherwise no new or novel complaints.  Decadron currently at 35m daily. ? ?Decadron ?03/18/21: 816m?03/25/10: 79m38m ?H+P (12/17/20) Patient presented on 11/23/20 with several episodes of sudden dysfunction of left arm and leg, lasting several minutes, with most recent episode accompanied by frank speech arrest while at work.  First episode was less than one week ago.  Between, she has been normal, at baseline.  CNS imaging demonstrated enhancing mass in the right frontal lobe.  She underwent craniotomy, resection with Dr. OstZada Finders 11/25/20, path demonstrated gliolblastoma.  On 10/28, she returned to the hospital with several days of progressive  left sided weakness.  After improvement back to baseline with steroids, she was discharged to home.  She has no new complaints currently, is scheduled for repeat MRI brain next week prior to radiation therapy.   ? ?Medications: ?Current Outpatient Medications on File Prior to Visit  ?Medication Sig Dispense Refill  ? acetaminophen (TYLENOL) 500 MG tablet Take 500 mg by mouth every 6 (six) hours as needed for moderate pain.    ? ALPRAZolam (XANAX) 0.25 MG tablet Take 4.5 tablets (1.125 mg total) by mouth daily as needed (Vestibular neuritis). Patient states she takes 4 of the 0.25 mg tablets and then splits one 0.25 mg tablet to make total of 1.125 mg daily for vestibular neuritis (Patient not taking: Reported on 12/17/2020)    ? dexamethasone (DECADRON) 4 MG tablet Take 1 tablet (4 mg total) by mouth 2 (two) times daily with a meal. 60 tablet 1  ? diazepam (VALIUM) 5 MG tablet Take 5 mg by mouth daily as needed. For MRI (Patient not taking: Reported on 02/02/2021)    ? levETIRAcetam (KEPPRA) 1000 MG tablet Take 1 tablet (1,000 mg total) by mouth 2 (two) times daily. 60 tablet 3  ? ondansetron (ZOFRAN) 8 MG tablet Take 1 tablet (8 mg total) by mouth 2 (two) times daily as needed (nausea and vomiting). May take 30-60 minutes prior to Temodar administration if nausea/vomiting occurs. 30 tablet 1  ? telmisartan (MICARDIS) 40 MG tablet Take 40 mg by mouth daily.    ? temozolomide (TEMODAR) 100 MG capsule Take 3 capsules (300 mg total) by mouth daily. May take on an empty stomach to decrease nausea &  vomiting. 15 capsule 0  ? ?No current facility-administered medications on file prior to visit.  ? ? ?Allergies:  ?Allergies  ?Allergen Reactions  ? Epinephrine Other (See Comments)  ?  "dizziness, rapid heartbeat"  ? Azithromycin   ?  dizziness  ? Motrin [Ibuprofen]   ?  dizziness  ? Ciprofloxacin   ?  Other reaction(s): vertigo  ? Sulfa Antibiotics   ?  Other reaction(s): rash  ? ?Past Medical History:  ?Past Medical  History:  ?Diagnosis Date  ? Cancer [facility])   ? basal cell carcinoma on left arm left eye brow  ? Hypertension   ? Vertigo   ? 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 ?Past Surgical History:  ?Past Surgical History:  ?Procedure Laterality Date  ? APPLICATION OF CRANIAL NAVIGATION Right 11/25/2020  ? Procedure: APPLICATION OF CRANIAL NAVIGATION;  Surgeon: Judith Part, MD;  Location: [facility];  Service: Neurosurgery;  Laterality: Right;  ? CHOLECYSTECTOMY  2017  ? CRANIOTOMY Right 11/25/2020  ? Procedure: CRANIOTOMY FOR  TUMOR RESECTION WITH Lucky Rathke;  Surgeon: Judith Part, MD;  Location: [facility];  Service: Neurosurgery;  Laterality: Right;  ? laproscopy    ? surgery for endometriosisi  ? OOPHORECTOMY  [id number]  ? rt then left ovary  ? ?Social History:  ?Social History  ? ?Socioeconomic History  ? Marital status: Married  ?  Spouse name: [name]  ? Number of children: 1  ? Years of education: 72  ? Highest education level: Not on file  ?Occupational History  ?  Employer: [employer]  ?Tobacco Use  ? Smoking status: Former  ?  Types: Cigarettes  ?  Quit date: 02/08/1996  ?  Years since quitting: 25.2  ? Smokeless tobacco: Never  ?Vaping Use  ? Vaping Use: Never used  ?Substance and Sexual Activity  ? Alcohol use: Not Currently  ?  Alcohol/week: 2.0 standard drinks  ?  Types: 2 Glasses of wine per week  ? Drug use: No  ? Sexual activity: Not Currently  ?Other Topics Concern  ? Not on file  ?Social History Narrative  ? Patient is married [name]).    ? Patient is left-handed.  ? Patient is working full-time.  ? Patient has a college education (Associate's)  ? Patient has one child.  ? Patient drinks 3 Cups of coffee daily.  ? ?Social Determinants of Health  ? ?Financial Resource Strain: Not on file  ?Food Insecurity: Not on file  ?Transportation Needs: Not on file  ?Physical Activity: Not on file  ?Stress: Not on file  ?Social Connections: Not on file  ?Intimate Partner Violence: Not on file  ? ?Family  History:  ?Family History  ?Problem Relation Age of Onset  ? Lung cancer Mother   ? Aneurysm Father   ? Heart disease Sister   ? Atrial fibrillation Brother   ? Colon cancer Neg Hx   ? ? ?Review of Systems: ?Constitutional: Doesn't report fevers, chills or abnormal weight loss ?Eyes: Doesn't report blurriness of vision ?Ears, nose, mouth, throat, and face: Doesn't report sore throat ?Respiratory: Doesn't report cough, dyspnea or wheezes ?Cardiovascular: Doesn't report palpitation, chest discomfort  ?Gastrointestinal:  Doesn't report nausea, constipation, diarrhea ?GU: Doesn't report incontinence ?Skin: Doesn't report skin rashes ?Neurological: Per HPI ?Musculoskeletal: Doesn't report joint pain ?Behavioral/Psych: Doesn't report anxiety ? ?Physical Exam: ?Vitals:  ? 04/22/21 1212  ?BP: 125/67  ?Pulse: 65  ?Resp: 17  ?Temp: 98 ?F (36.7 ?C)  ?SpO2: 100%  ? ?  KPS: 70. ?General: Alert, cooperative, pleasant, in no acute distress ?Head: Normal ?EENT: No conjunctival injection or scleral icterus.  ?Lungs: Resp effort normal ?Cardiac: Regular rate ?Abdomen: Non-distended abdomen ?Skin: No rashes cyanosis or petechiae. ?Extremities: No clubbing or edema ? ?Neurologic Exam: ?Mental Status: Awake, alert, attentive to examiner. Oriented to self and environment. Language is fluent with intact comprehension.  ?Cranial Nerves: Visual acuity is grossly normal. Visual fields are full. Extra-ocular movements intact. No ptosis. Face is symmetric ?Motor: Tone and bulk are normal. 4-/5 in left arm and handm, 4+/5 in left leg. Reflexes are symmetric, no pathologic reflexes present.  ?Sensory: Intact to light touch ?Gait: Hemiparetic ? ? ?Labs: ?I have reviewed the data as listed ?   ?Component Value Date/Time  ? NA 141 04/22/2021 1149  ? K 3.4 (L) 04/22/2021 1149  ? CL 107 04/22/2021 1149  ? CO2 29 04/22/2021 1149  ? GLUCOSE 77 04/22/2021 1149  ? BUN 20 04/22/2021 1149  ? CREATININE 0.89 04/22/2021 1149  ? CALCIUM 9.2 04/22/2021 1149  ?  PROT 6.2 (L) 04/22/2021 1149  ? ALBUMIN 4.0 04/22/2021 1149  ? AST 17 04/22/2021 1149  ? ALT 41 04/22/2021 1149  ? ALKPHOS 33 (L) 04/22/2021 1149  ? BILITOT 0.5 04/22/2021 1149  ? GFRNONAA >60 04/22/2021 1149  ? ?

## 2021-04-23 ENCOUNTER — Telehealth: Payer: Self-pay | Admitting: Internal Medicine

## 2021-04-23 NOTE — Telephone Encounter (Signed)
Scheduled per 3/16 los, pt's husband confirmed appt ?

## 2021-04-25 ENCOUNTER — Emergency Department (HOSPITAL_COMMUNITY): Payer: BC Managed Care – PPO

## 2021-04-25 ENCOUNTER — Other Ambulatory Visit: Payer: Self-pay

## 2021-04-25 ENCOUNTER — Observation Stay (HOSPITAL_COMMUNITY): Payer: BC Managed Care – PPO

## 2021-04-25 ENCOUNTER — Encounter (HOSPITAL_COMMUNITY): Payer: Self-pay

## 2021-04-25 ENCOUNTER — Inpatient Hospital Stay (HOSPITAL_COMMUNITY)
Admission: EM | Admit: 2021-04-25 | Discharge: 2021-04-28 | DRG: 056 | Disposition: A | Discharge status: Home Health | Payer: BC Managed Care – PPO | Attending: Internal Medicine | Admitting: Internal Medicine

## 2021-04-25 DIAGNOSIS — Z881 Allergy status to other antibiotic agents status: Secondary | ICD-10-CM | POA: Diagnosis not present

## 2021-04-25 DIAGNOSIS — Z20822 Contact with and (suspected) exposure to covid-19: Secondary | ICD-10-CM | POA: Diagnosis not present

## 2021-04-25 DIAGNOSIS — R531 Weakness: Secondary | ICD-10-CM | POA: Diagnosis not present

## 2021-04-25 DIAGNOSIS — R569 Unspecified convulsions: Secondary | ICD-10-CM | POA: Diagnosis not present

## 2021-04-25 DIAGNOSIS — Z66 Do not resuscitate: Secondary | ICD-10-CM | POA: Diagnosis present

## 2021-04-25 DIAGNOSIS — Z9221 Personal history of antineoplastic chemotherapy: Secondary | ICD-10-CM

## 2021-04-25 DIAGNOSIS — I10 Essential (primary) hypertension: Secondary | ICD-10-CM | POA: Diagnosis not present

## 2021-04-25 DIAGNOSIS — Z888 Allergy status to other drugs, medicaments and biological substances status: Secondary | ICD-10-CM | POA: Diagnosis not present

## 2021-04-25 DIAGNOSIS — H8122 Vestibular neuronitis, left ear: Secondary | ICD-10-CM | POA: Diagnosis not present

## 2021-04-25 DIAGNOSIS — R5383 Other fatigue: Secondary | ICD-10-CM | POA: Diagnosis not present

## 2021-04-25 DIAGNOSIS — G936 Cerebral edema: Secondary | ICD-10-CM | POA: Diagnosis present

## 2021-04-25 DIAGNOSIS — M6250 Muscle wasting and atrophy, not elsewhere classified, unspecified site: Secondary | ICD-10-CM | POA: Diagnosis present

## 2021-04-25 DIAGNOSIS — R220 Localized swelling, mass and lump, head: Secondary | ICD-10-CM | POA: Diagnosis not present

## 2021-04-25 DIAGNOSIS — C719 Malignant neoplasm of brain, unspecified: Secondary | ICD-10-CM | POA: Diagnosis not present

## 2021-04-25 DIAGNOSIS — Z801 Family history of malignant neoplasm of trachea, bronchus and lung: Secondary | ICD-10-CM | POA: Diagnosis not present

## 2021-04-25 DIAGNOSIS — Z87891 Personal history of nicotine dependence: Secondary | ICD-10-CM

## 2021-04-25 DIAGNOSIS — F40240 Claustrophobia: Secondary | ICD-10-CM | POA: Diagnosis not present

## 2021-04-25 DIAGNOSIS — D496 Neoplasm of unspecified behavior of brain: Secondary | ICD-10-CM | POA: Diagnosis not present

## 2021-04-25 DIAGNOSIS — R29818 Other symptoms and signs involving the nervous system: Secondary | ICD-10-CM | POA: Diagnosis not present

## 2021-04-25 DIAGNOSIS — Z79899 Other long term (current) drug therapy: Secondary | ICD-10-CM

## 2021-04-25 DIAGNOSIS — G8194 Hemiplegia, unspecified affecting left nondominant side: Secondary | ICD-10-CM | POA: Diagnosis not present

## 2021-04-25 DIAGNOSIS — I959 Hypotension, unspecified: Secondary | ICD-10-CM | POA: Diagnosis not present

## 2021-04-25 DIAGNOSIS — Z8249 Family history of ischemic heart disease and other diseases of the circulatory system: Secondary | ICD-10-CM | POA: Diagnosis not present

## 2021-04-25 DIAGNOSIS — Z886 Allergy status to analgesic agent status: Secondary | ICD-10-CM

## 2021-04-25 DIAGNOSIS — Z882 Allergy status to sulfonamides status: Secondary | ICD-10-CM | POA: Diagnosis not present

## 2021-04-25 DIAGNOSIS — R414 Neurologic neglect syndrome: Secondary | ICD-10-CM | POA: Diagnosis present

## 2021-04-25 DIAGNOSIS — Z85828 Personal history of other malignant neoplasm of skin: Secondary | ICD-10-CM | POA: Diagnosis not present

## 2021-04-25 LAB — BASIC METABOLIC PANEL
Anion gap: 8 (ref 5–15)
BUN: 15 mg/dL (ref 8–23)
CO2: 25 mmol/L (ref 22–32)
Calcium: 9.8 mg/dL (ref 8.9–10.3)
Chloride: 106 mmol/L (ref 98–111)
Creatinine, Ser: 0.78 mg/dL (ref 0.44–1.00)
GFR, Estimated: >60 mL/min (ref >60)
Glucose, Bld: 138 mg/dL — ABNORMAL HIGH (ref 70–99)
Potassium: 3.9 mmol/L (ref 3.5–5.1)
Sodium: 139 mmol/L (ref 135–145)

## 2021-04-25 LAB — CBC WITH DIFFERENTIAL/PLATELET
Abs Immature Granulocytes: 0.06 10*3/uL (ref 0.00–0.07)
Basophils Absolute: 0 10*3/uL (ref 0.0–0.1)
Basophils Relative: 0 %
Eosinophils Absolute: 0 10*3/uL (ref 0.0–0.5)
Eosinophils Relative: 0 %
HCT: 46.1 % — ABNORMAL HIGH (ref 36.0–46.0)
Hemoglobin: 15.2 g/dL — ABNORMAL HIGH (ref 12.0–15.0)
Immature Granulocytes: 1 %
Lymphocytes Relative: 5 %
Lymphs Abs: 0.4 10*3/uL — ABNORMAL LOW (ref 0.7–4.0)
MCH: 30.6 pg (ref 26.0–34.0)
MCHC: 33 g/dL (ref 30.0–36.0)
MCV: 92.9 fL (ref 80.0–100.0)
Monocytes Absolute: 0.1 10*3/uL (ref 0.1–1.0)
Monocytes Relative: 1 %
Neutro Abs: 6.7 10*3/uL (ref 1.7–7.7)
Neutrophils Relative %: 93 %
Platelets: 176 10*3/uL (ref 150–400)
RBC: 4.96 MIL/uL (ref 3.87–5.11)
RDW: 12.2 % (ref 11.5–15.5)
WBC: 7.2 10*3/uL (ref 4.0–10.5)
nRBC: 0 % (ref 0.0–0.2)

## 2021-04-25 LAB — RESP PANEL BY RT-PCR (FLU A&B, COVID) ARPGX2
Influenza A by PCR: NEGATIVE
Influenza B by PCR: NEGATIVE
SARS Coronavirus 2 by RT PCR: NEGATIVE

## 2021-04-25 IMAGING — CR DG CHEST 2V
2 series · 2 of 2 positions shown · non-contrast
Comparison: Chest CT [DATE]

CLINICAL DATA: Weakness.

EXAM:
CHEST - 2 VIEW

[w chest lat]
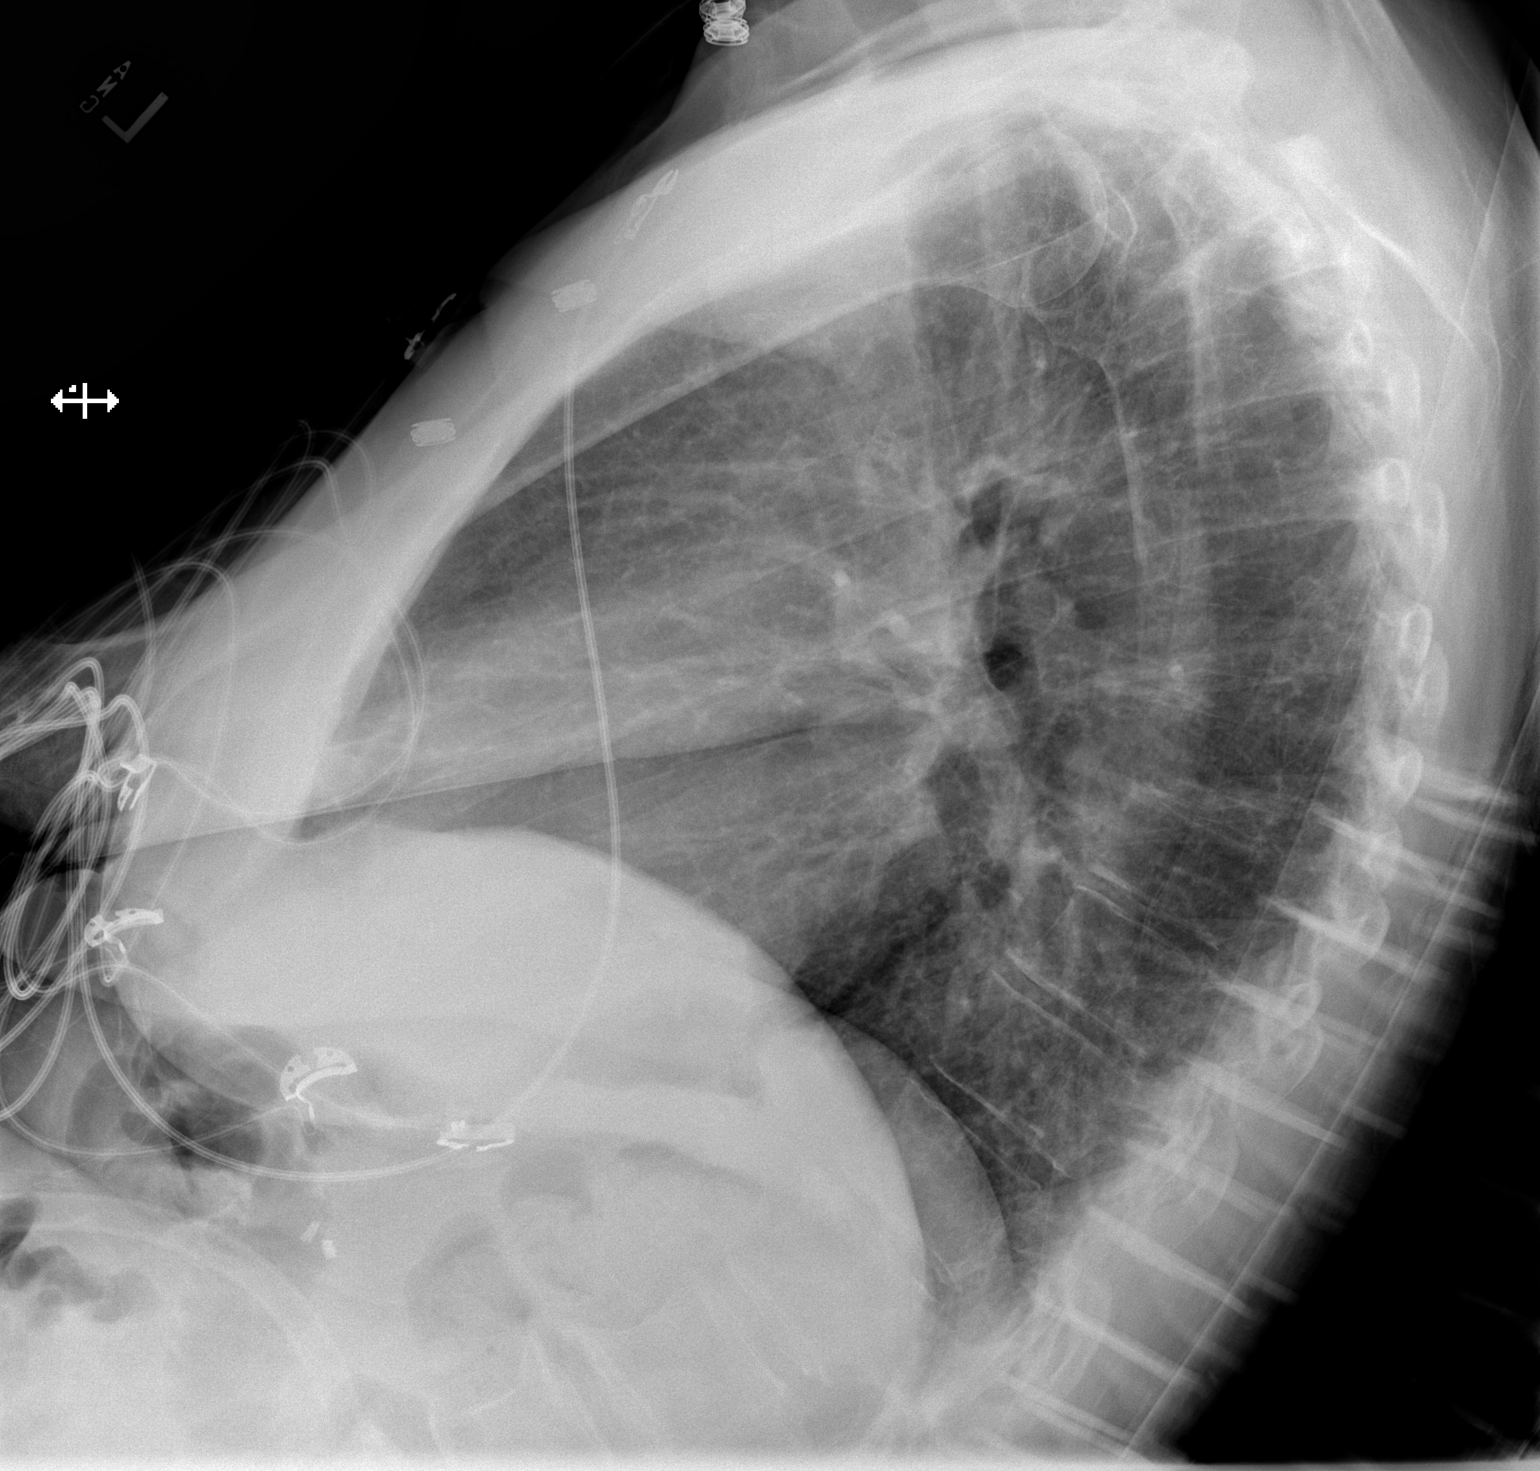

[x chest ap]
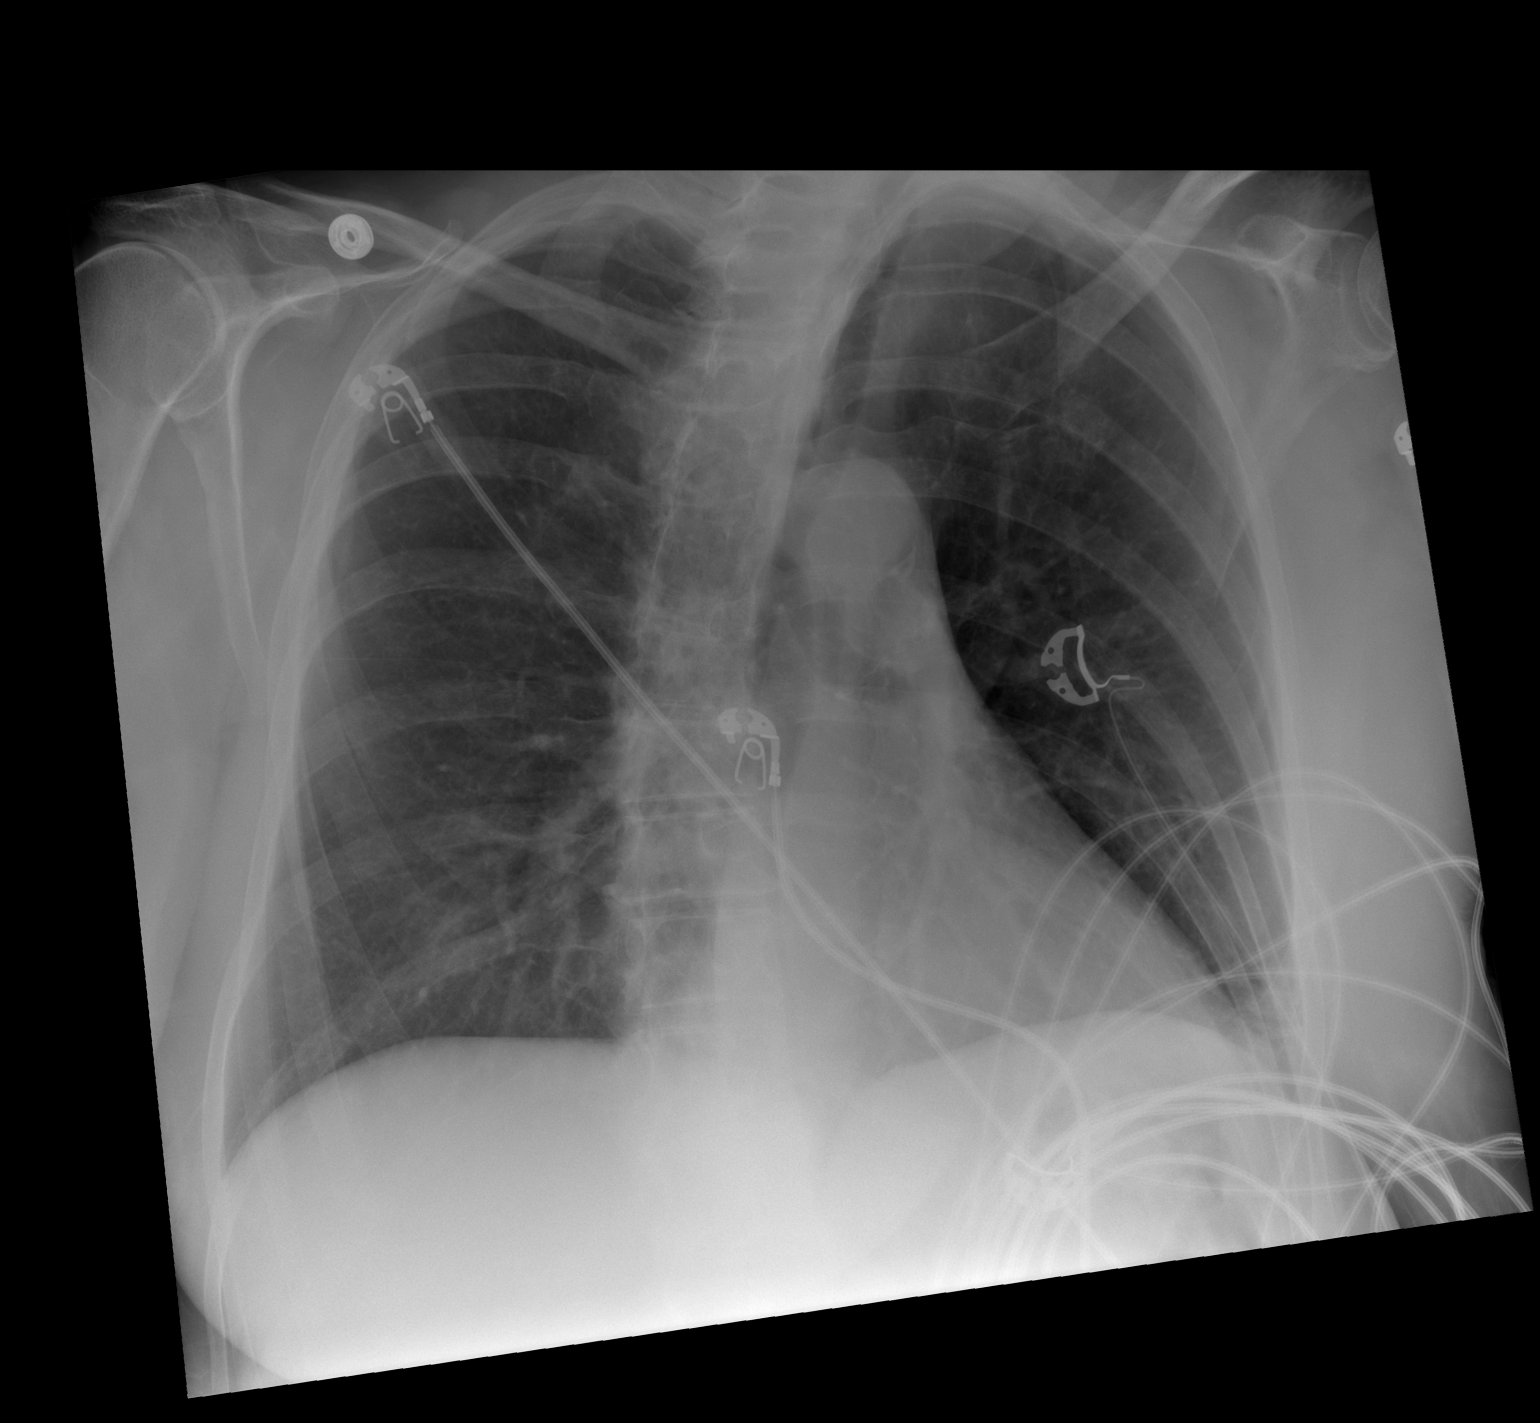

[2 of 2 positions shown; findings below may reference images not displayed]

FINDINGS: Patient is rotated. The heart is normal in size. Normal mediastinal
contours for degree of rotation. No pulmonary edema, pleural
effusion, pneumothorax, or focal airspace disease. The tiny
sclerotic focus within T9 on prior CT is not seen by radiograph. No
acute osseous findings.
IMPRESSION: No acute chest findings.

## 2021-04-25 IMAGING — MR MR HEAD WO/W CM
15 series · 48 of 48 positions shown · IV contrast (gadavist)
Comparison: MRI [DATE].

CLINICAL DATA: Brain/CNS neoplasm, staging

EXAM:
MRI HEAD WITHOUT AND WITH CONTRAST
TECHNIQUE: Multiplanar, multiecho pulse sequences of the brain and surrounding
structures were obtained without and with intravenous contrast.
CONTRAST:  7mL GADAVIST GADOBUTROL 1 MMOL/ML IV SOLN

[Series 5: DWI · axial · 3.0mm · 1.36mm/px · z∈[-107,+30]mm · 6 of 96 slices shown (1 of 2)]
[im 1/96]
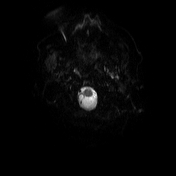
[im 20/96]
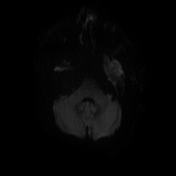
[im 39/96]
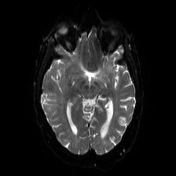
[im 58/96]
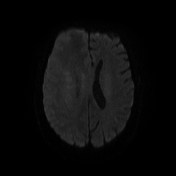
[im 77/96]
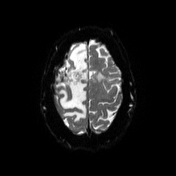
[im 96/96]
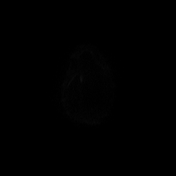

[Series 6: DWI · axial · 3.0mm · 1.36mm/px · z∈[-107,+30]mm · 3 of 48 slices shown (2 of 2)]
[im 1/48]
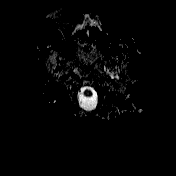
[im 24/48]
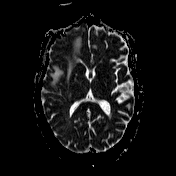
[im 48/48]
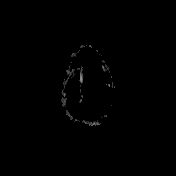

[Series 7: T1 · sagittal · 5.0mm · 0.75mm/px · 1 of 24 slices shown (1 of 4)]
[im 1/24]
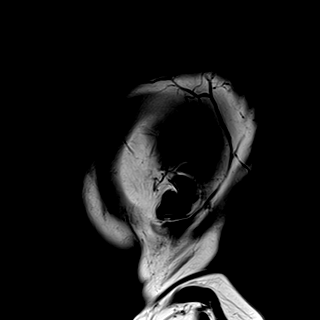

[Series 8: T2 · axial · 5.0mm · 0.62mm/px · 1 of 24 slices shown]
[im 1/24]
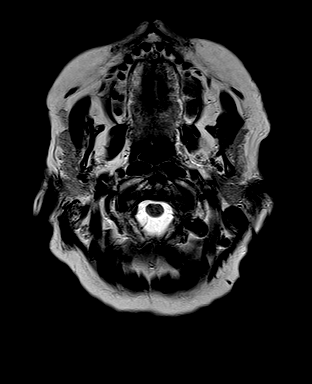

[Series 9: swi_images · axial · 3.0mm · 0.75mm/px · z∈[-137,+70]mm · 4 of 72 slices shown]
[im 1/72]
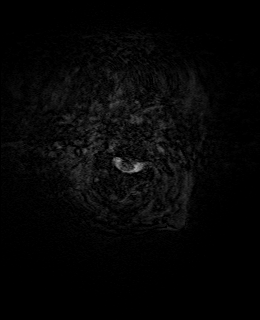
[im 24/72]
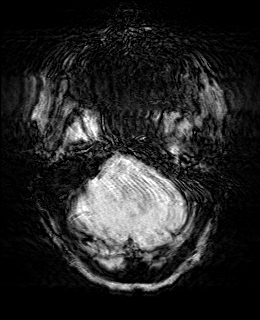
[im 48/72]
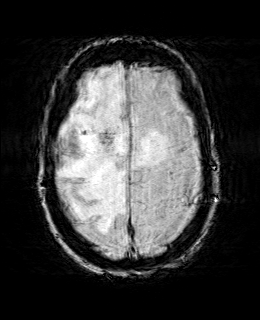
[im 72/72]
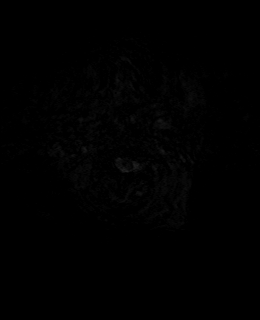

[Series 11: FLAIR · axial · 3.0mm · 0.75mm/px · z∈[-108,+41]mm · 3 of 52 slices shown]
[im 1/52]
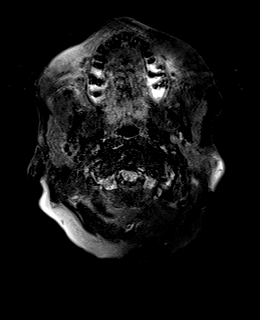
[im 26/52]
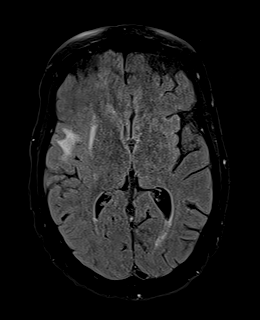
[im 52/52]
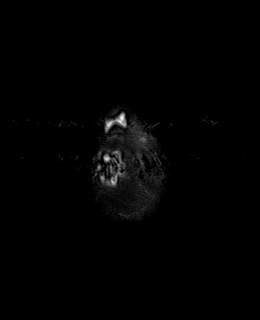

[Series 12: T1 · axial · 1.0mm · 0.94mm/px · z∈[-103,+36]mm · 8 of 144 slices shown (2 of 4)]
[im 1/144]
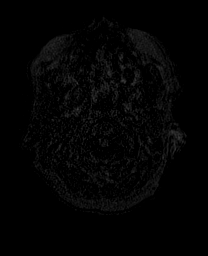
[im 21/144]
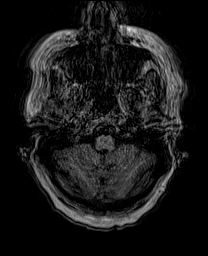
[im 41/144]
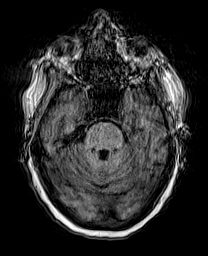
[im 62/144]
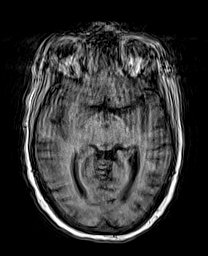
[im 82/144]
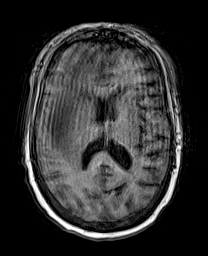
[im 103/144]
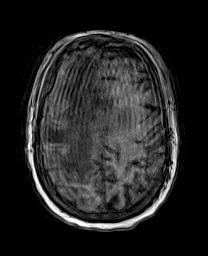
[im 123/144]
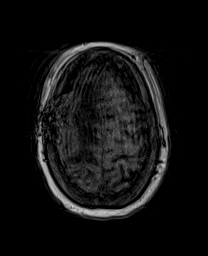
[im 144/144]
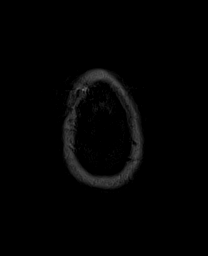

[Series 13: cor dwi_tracew · coronal · 5.0mm · 1.53mm/px · 3 of 60 slices shown]
[im 1/60]
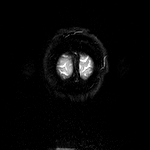
[im 30/60]
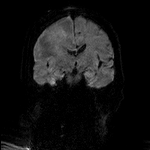
[im 60/60]
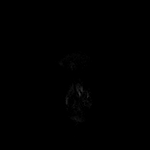

[Series 14: cor dwi_adc · coronal · 5.0mm · 1.53mm/px · 2 of 30 slices shown]
[im 1/30]
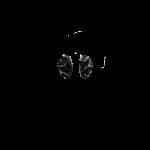
[im 30/30]
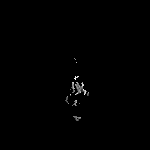

[Series 15: T2 post-contrast · coronal · 5.0mm · 0.57mm/px · 2 of 28 slices shown]
[im 1/28]
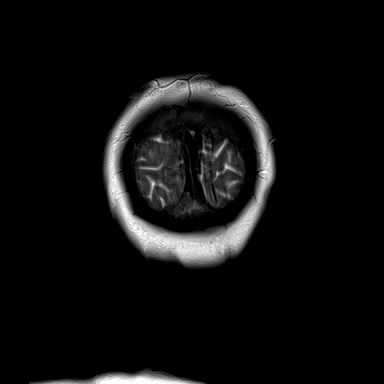
[im 28/28]
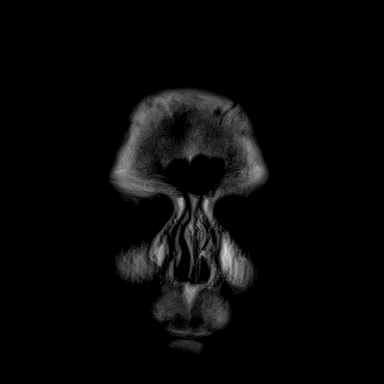

[Series 16: T1 post-contrast · axial · 1.0mm · 0.94mm/px · z∈[-103,+36]mm · 8 of 144 slices shown (1 of 3)]
[im 1/144]
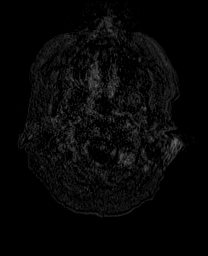
[im 21/144]
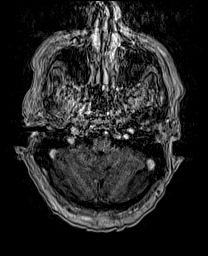
[im 41/144]
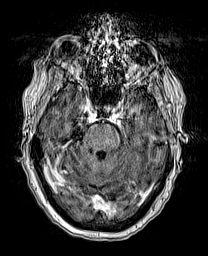
[im 62/144]
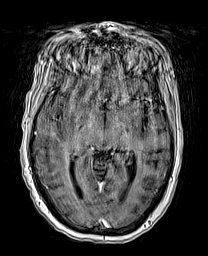
[im 82/144]
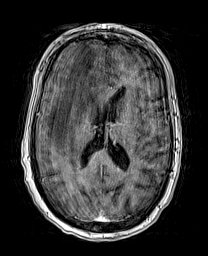
[im 103/144]
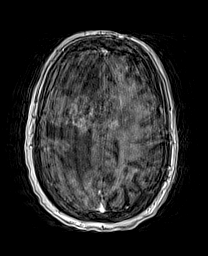
[im 123/144]
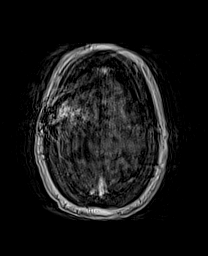
[im 144/144]
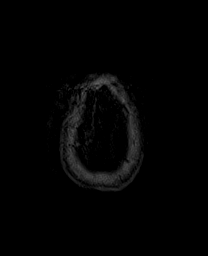

[Series 17: T1 · sagittal · 4.0mm · 0.94mm/px · 2 of 30 slices shown (3 of 4)]
[im 1/30]
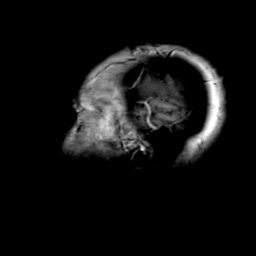
[im 30/30]
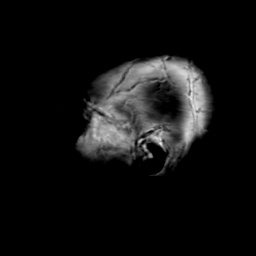

[Series 18: T1 · coronal · 4.0mm · 0.94mm/px · 2 of 30 slices shown (4 of 4)]
[im 1/30]
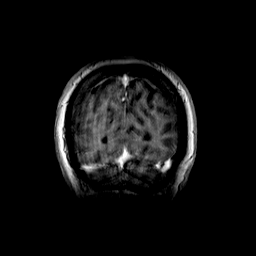
[im 30/30]
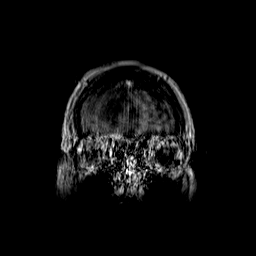

[Series 19: T1 post-contrast · coronal · 5.0mm · 0.43mm/px · 2 of 28 slices shown (2 of 3)]
[im 1/28]
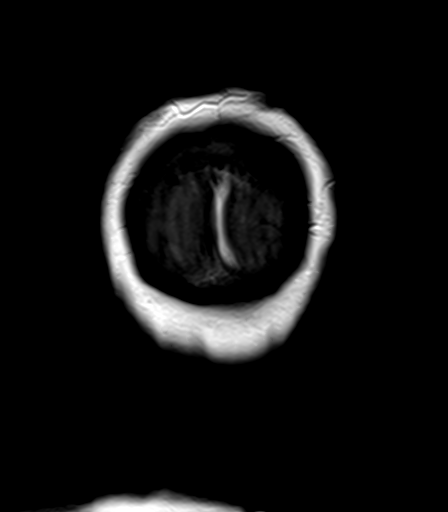
[im 28/28]
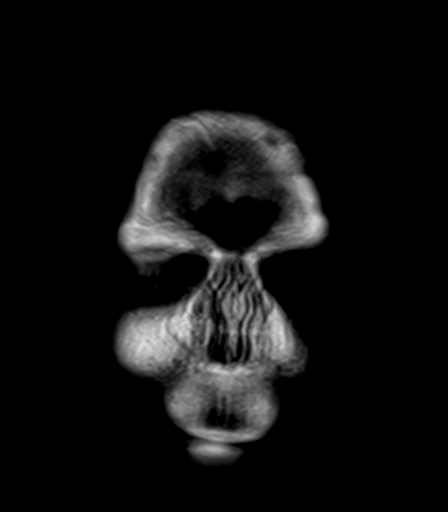

[Series 21: T1 post-contrast · sagittal · 5.0mm · 0.75mm/px · 1 of 24 slices shown (3 of 3)]
[im 1/24]
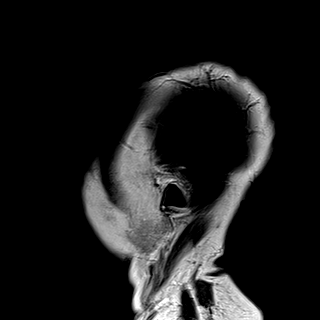

[48 of 48 positions shown; findings below may reference images not displayed]

FINDINGS: Brain: Postcontrast imaging is moderately limited by motion making
comparison to the prior difficult; however, the dominant area of
enhancement within the right frontal lobe measures slightly
increased in size (measuring 4.9 by 2.8 cm on series 17, images 108
and 109, previously 4.2 x 2.7 cm when remeasured). Additionally,
there appears to be slight interval increase in surrounding
vasogenic edema which extends slightly further posteriorly in the
right frontal lobe. No substantial change in edema extending across
the corpus callosum into the left frontal lobe and similar
discontinuous areas of nodular enhancement along the body of the
corpus callosum and right frontal white matter.

Resulting slight increase in mass effect with approximately 4 mm of
leftward midline shift. There is partial effacement the right
lateral ventricle. No evidence of hydrocephalus. No evidence of
acute infarct. There is some areas of chronic blood products in the
region of debulked tumor.

Vascular: Major arterial flow voids are maintained at the skull
base.

Skull and upper cervical spine: Normal marrow signal.

Sinuses/Orbits: Clear sinuses.  No acute orbital findings.

Other: No mastoid effusions.
IMPRESSION: Motion limited assessment, particularly on postcontrast imaging.
Within this limitation, there appears to be mild interval increase
in size of the dominant enhancement within the right frontal lobe
with similar discontiguous nodular enhancement along the body of the
corpus callosum and in the right frontal lobe. Also, extensive
surrounding T2/FLAIR hyperintensity in the right frontal lobe is
likely mildly increased with similar edema extending along the
corpus callosum into the left frontal lobe.

Resulting slight increase in mass effect with approximately 4 mm of
leftward midline shift.

## 2021-04-25 MED ORDER — ALPRAZOLAM 0.5 MG PO TABS: 1.1250 mg | ORAL_TABLET | Freq: Every day | ORAL | Status: DC | PRN

## 2021-04-25 MED ORDER — SENNOSIDES-DOCUSATE SODIUM 8.6-50 MG PO TABS: 1.0000 | ORAL_TABLET | Freq: Two times a day (BID) | ORAL | Status: DC | PRN

## 2021-04-25 MED ORDER — LEVETIRACETAM 500 MG PO TABS
1000.0000 mg | ORAL_TABLET | Freq: Two times a day (BID) | ORAL | Status: DC
  Administered 2021-04-25 – 2021-04-28 (×6): 1000 mg via ORAL
  Filled 2021-04-25 (×6): qty 2

## 2021-04-25 MED ORDER — DEXAMETHASONE SODIUM PHOSPHATE 4 MG/ML IJ SOLN
4.0000 mg | Freq: Three times a day (TID) | INTRAMUSCULAR | Status: DC
  Administered 2021-04-25 – 2021-04-28 (×8): 4 mg via INTRAVENOUS
  Filled 2021-04-25 (×9): qty 1

## 2021-04-25 MED ORDER — ONDANSETRON HCL 4 MG/2ML IJ SOLN: 4.0000 mg | Freq: Four times a day (QID) | INTRAMUSCULAR | Status: DC | PRN

## 2021-04-25 MED ORDER — LORAZEPAM 2 MG/ML IJ SOLN
1.0000 mg | Freq: Once | INTRAMUSCULAR | Status: AC | PRN
  Administered 2021-04-25: 1 mg via INTRAVENOUS
  Filled 2021-04-25: qty 1

## 2021-04-25 MED ORDER — ACETAMINOPHEN 650 MG RE SUPP: 650.0000 mg | Freq: Four times a day (QID) | RECTAL | Status: DC | PRN

## 2021-04-25 MED ORDER — GADOBUTROL 1 MMOL/ML IV SOLN
8.0000 mL | Freq: Once | INTRAVENOUS | Status: AC | PRN
  Administered 2021-04-25: 7 mL via INTRAVENOUS

## 2021-04-25 MED ORDER — ACETAMINOPHEN 325 MG PO TABS: 650.0000 mg | ORAL_TABLET | Freq: Four times a day (QID) | ORAL | Status: DC | PRN

## 2021-04-25 MED ORDER — DEXAMETHASONE SODIUM PHOSPHATE 10 MG/ML IJ SOLN
8.0000 mg | Freq: Once | INTRAMUSCULAR | Status: AC
  Administered 2021-04-25: 8 mg via INTRAVENOUS
  Filled 2021-04-25: qty 1

## 2021-04-25 MED ORDER — POLYETHYLENE GLYCOL 3350 17 G PO PACK: 17.0000 g | PACK | Freq: Two times a day (BID) | ORAL | Status: DC | PRN

## 2021-04-25 MED ORDER — ENOXAPARIN SODIUM 40 MG/0.4ML IJ SOSY
40.0000 mg | PREFILLED_SYRINGE | INTRAMUSCULAR | Status: DC
  Administered 2021-04-26 – 2021-04-27 (×2): 40 mg via SUBCUTANEOUS
  Filled 2021-04-25 (×2): qty 0.4

## 2021-04-25 MED ORDER — HYDROMORPHONE HCL 1 MG/ML IJ SOLN: 0.5000 mg | INTRAMUSCULAR | Status: DC | PRN

## 2021-04-25 MED ORDER — TEMOZOLOMIDE 5 MG PO CAPS: 200.0000 mg/m2/d | ORAL_CAPSULE | Freq: Every day | ORAL | Status: DC

## 2021-04-25 MED ORDER — OXYCODONE HCL 5 MG PO TABS: 5.0000 mg | ORAL_TABLET | Freq: Four times a day (QID) | ORAL | Status: DC | PRN

## 2021-04-25 MED ORDER — ONDANSETRON HCL 4 MG PO TABS: 4.0000 mg | ORAL_TABLET | Freq: Four times a day (QID) | ORAL | Status: DC | PRN

## 2021-04-25 MED ORDER — IRBESARTAN 150 MG PO TABS
150.0000 mg | ORAL_TABLET | Freq: Every day | ORAL | Status: DC
  Administered 2021-04-25 – 2021-04-28 (×4): 150 mg via ORAL
  Filled 2021-04-25 (×4): qty 1

## 2021-04-25 NOTE — H&P (Signed)
?History and Physical  ? ? ?Patient: Erika Mitchell FAO:[id number] DOB: 08-15-1955 ?DOA: 04/25/2021 ?DOS: the patient was seen and examined on 04/25/2021 ?PCP: Ginger Organ., MD  ?Patient coming from: Home.  Lives with husband. ? ?Chief Complaint:  ?Chief Complaint  ?Patient presents with  ? Weakness  ? ?HPI: Erika Mitchell  ?66 year old F with PMH of glioblastoma s/p craniotomy and resection in 11/2020 with residual left hemiparesis on chemotherapy, HTN, vestibular neuritis  and seizure presenting with left-sided weakness. ? ?History provided by patient and patient's husband at bedside.  Patient was seen by neurooncology, Dr. Mickeal Skinner 3 days ago.  At that time, she was advised to reduce his Decadron from 4 mg to 2 mg daily.  Per patient's husband, patient continue Decadron 4 mg daily.  On Friday, she had worsening left-sided weakness.  She had trouble getting out of the chair yesterday.  She was shuffling both feet.  Weakness continued to get worse.  She could not walk today, that prompted them to call EMS.  She also had bitemporal headache 2 days ago that has resolved with Tylenol.  She denies fever, chills, visual change, focal numbness or tingling, nausea, vomiting, abdominal pain, diarrhea or UTI symptoms. ? ?Patient lives with husband.  Denies smoking cigarette.  Admits to occasional social alcohol.  Denies recreational drug use.  She is DNR. ? ?In ED, vitals and CBC and BMP stable.  Twelve-lead EKG NSR.  Portable CXR without acute finding.  CT head without contrast showed mildly progressed extensive vasogenic edema in the right frontal lobe, corpus callosum and to lesser extent left frontal lobe with similar to slightly progressed mass effect with slight leftward midline shift.  Dr. Mickeal Skinner contacted by EDP and recommended 8 mg Decadron.  Hospitalist service called for admission. ? ?  ?Review of Systems: As mentioned in the history of present illness. All other systems reviewed and are negative. ?Past  Medical History:  ?Diagnosis Date  ? Cancer [facility])   ? basal cell carcinoma on left arm left eye brow  ? Hypertension   ? Vertigo   ? 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 ?Past Surgical History:  ?Procedure Laterality Date  ? APPLICATION OF CRANIAL NAVIGATION Right 11/25/2020  ? Procedure: APPLICATION OF CRANIAL NAVIGATION;  Surgeon: Judith Part, MD;  Location: [place];  Service: Neurosurgery;  Laterality: Right;  ? CHOLECYSTECTOMY  2017  ? CRANIOTOMY Right 11/25/2020  ? Procedure: CRANIOTOMY FOR  TUMOR RESECTION WITH Lucky Rathke;  Surgeon: Judith Part, MD;  Location: [place];  Service: Neurosurgery;  Laterality: Right;  ? laproscopy    ? surgery for endometriosisi  ? OOPHORECTOMY  [id number]  ? rt then left ovary  ? ?Social History:  reports that she quit smoking about 25 years ago. Her smoking use included cigarettes. She has never used smokeless tobacco. She reports that she does not currently use alcohol after a past usage of about 2.0 standard drinks per week. She reports that she does not use drugs. ? ?Allergies  ?Allergen Reactions  ? Epinephrine Other (See Comments)  ?  "dizziness, rapid heartbeat"  ? Azithromycin   ?  dizziness  ? Motrin [Ibuprofen]   ?  dizziness  ? Ciprofloxacin   ?  Other reaction(s): vertigo  ? Sulfa Antibiotics   ?  Other reaction(s): rash  ? ? ?Family History  ?Problem Relation Age of Onset  ? Lung cancer Mother   ? Aneurysm Father   ? Heart disease  Sister   ? Atrial fibrillation Brother   ? Colon cancer Neg Hx   ? ? ?Prior to Admission medications   ?Medication Sig Start Date End Date Taking? Authorizing Provider  ?acetaminophen (TYLENOL) 500 MG tablet Take 500 mg by mouth every 6 (six) hours as needed for moderate pain.    [provider]  ?ALPRAZolam Duanne Moron) 0.25 MG tablet Take 4.5 tablets (1.125 mg total) by mouth daily as needed (Vestibular neuritis). Patient states she takes 4 of the 0.25 mg tablets and then splits one 0.25 mg tablet to make total of 1.125 mg  daily for vestibular neuritis ?Patient not taking: Reported on 12/17/2020 12/09/20   Jennye Boroughs, MD  ?dexamethasone (DECADRON) 1 MG tablet Take 2 tablets (2 mg total) by mouth daily. 04/22/21   Ventura Sellers, MD  ?diazepam (VALIUM) 5 MG tablet Take 1 tablet (5 mg total) by mouth daily as needed. For MRI 04/22/21   Ventura Sellers, MD  ?levETIRAcetam (KEPPRA) 1000 MG tablet Take 1 tablet (1,000 mg total) by mouth 2 (two) times daily. 02/15/21   Ventura Sellers, MD  ?ondansetron (ZOFRAN) 8 MG tablet Take 1 tablet (8 mg total) by mouth 2 (two) times daily as needed (nausea and vomiting). May take 30-60 minutes prior to Temodar administration if nausea/vomiting occurs. 03/18/21   Ventura Sellers, MD  ?telmisartan (MICARDIS) 40 MG tablet Take 40 mg by mouth daily.    [provider]  ?temozolomide (TEMODAR) 100 MG capsule Take 4 capsules (400 mg total) by mouth daily. May take on an empty stomach to decrease nausea & vomiting. 04/22/21   Ventura Sellers, MD  ? ? ?Physical Exam: ?Vitals:  ? 04/25/21 1530 04/25/21 1600 04/25/21 1630 04/25/21 1700  ?BP: 135/83 133/84 131/81 129/83  ?Pulse: 69 73 71 70  ?Resp: 19 20 (!) 24 (!) 23  ?Temp:      ?TempSrc:      ?SpO2: 94% 94% 93% 96%  ? ?GENERAL: No apparent distress.  Nontoxic. ?HEENT: MMM.  Vision and hearing grossly intact.  Craniotomy scar. ?NECK: Supple.  No apparent JVD.  ?RESP:  No IWOB.  Fair aeration bilaterally. ?CVS:  RRR. Heart sounds normal.  ?ABD/GI/GU: BS+. Abd soft, NTND.  ?MSK/EXT:  Moves extremities.  Seems to have left arm muscular atrophy. ?SKIN: no apparent skin lesion or wound ?NEURO: Awake and alert. Oriented x4.  Cranial nerves grossly intact.  Motor 3+/5 in LUE, 4/5 in LLE and 5/5 in RUE and RLE.  Pronator drift in left arm.  Finger-to-nose off and left arm. ?PSYCH: Calm. Normal affect.  ? ?Data Reviewed: ?See HPI ? ?Assessment and Plan: ?Assessment and Plan: ?* Generalized weakness ?Patient with generalized weakness with more pronounced  left hemiparesis likely due to underlying glioblastoma with vasogenic edema.  She has pronounced weakness with some degree of muscular atrophy in left upper extremity.  She was weaned off Decadron 4 mg to 2 mg 3 days ago by neurooncology.  However, husband reports that she is still taking 4 mg daily.  CT head with mildly progressed vasogenic edema and slight midline shift.  Received Decadron 8 mg IV in ED per neurooncology recommendation ?-Continue Decadron 4 mg every 8 hours ?-Check MRI brain with or without contrast.  Discussed result with her neuro oncologist. ?-IV Ativan 1 mg for claustrophobia ?-Continue home Keppra for seizure prophylaxis. ?-Fall precaution, PT/OT eval ? ?Glioblastoma, IDH-wildtype ([place]) ?S/p craniotomy with resection in 11/2020.  Seems to be on Temodar.  Per last  neurooncology note, plan was to get MRI brain with and without contrast before starting radiation therapy. ?-We will discuss with the neuro oncologist after MRI brain result ? ? ?Seizures ([place]) ?Likely from brain mass. ?-Continue home care after med rec. ? ?Cerebral edema ([facility]) ?Decadron as above. ? ?Essential hypertension ?Seems to be on Micardis at home. ?-Resume home meds after med rec. ? ? ? ? Advance Care Planning:   Code Status: DNR  ? ?Consults: Neuro oncology by EDP ? ?Family Communication: Updated patient's husband at bedside. ? ?Severity of Illness: ?The appropriate patient status for this patient is OBSERVATION. Observation status is judged to be reasonable and necessary in order to provide the required intensity of service to ensure the patient's safety. The patient's presenting symptoms, physical exam findings, and initial radiographic and laboratory data in the context of their medical condition is felt to place them at decreased risk for further clinical deterioration. Furthermore, it is anticipated that the patient will be medically stable for discharge from the hospital within 2 midnights of admission.   ? ?Author: ?Mercy Riding, MD ?04/25/2021 5:57 PM ? ?For on call review [email or web address].  ?

## 2021-04-25 NOTE — ED Provider Notes (Signed)
?[facility] DEPT ?Provider Note ? ? ?CSN: [id number] ?Arrival date & time: 04/25/21  1354 ? ?  ? ?History ? ?Chief Complaint  ?Patient presents with  ? Weakness  ? ? ?Erika Mitchell is a 66 y.o. female with a PMH of glioblastoma, cerebral edema, HTN, seizures, progressive weakness, hx of craniotomy.  Patient sees Dr. Mickeal Skinner for neuro-oncology.  She presents today with concern for progressive weakness, fatigue, left-sided deficits that began earlier today.  She does have a history of some left-sided deficits at baseline, but reports acute worsening.  Patient denies facial droop, dysarthria, worsening confusion. ? ? ?Weakness ? ?  ? ?Home Medications ?Prior to Admission medications   ?Medication Sig Start Date End Date Taking? Authorizing Provider  ?acetaminophen (TYLENOL) 500 MG tablet Take 500 mg by mouth every 6 (six) hours as needed for moderate pain.   Yes [provider]  ?dexamethasone (DECADRON) 4 MG tablet Take 4 mg by mouth daily.   Yes [provider]  ?diazepam (VALIUM) 5 MG tablet Take 1 tablet (5 mg total) by mouth daily as needed. For MRI ?Patient taking differently: Take 5 mg by mouth daily as needed for anxiety. For MRI 04/22/21  Yes Vaslow, Acey Lav, MD  ?levETIRAcetam (KEPPRA) 1000 MG tablet Take 1 tablet (1,000 mg total) by mouth 2 (two) times daily. 02/15/21  Yes Vaslow, Acey Lav, MD  ?ondansetron (ZOFRAN) 8 MG tablet Take 1 tablet (8 mg total) by mouth 2 (two) times daily as needed (nausea and vomiting). May take 30-60 minutes prior to Temodar administration if nausea/vomiting occurs. ?Patient taking differently: Take 8 mg by mouth 2 (two) times daily as needed for nausea or vomiting. May take 30-60 minutes prior to Temodar administration if nausea/vomiting occurs. 03/18/21  Yes Vaslow, Acey Lav, MD  ?telmisartan (MICARDIS) 40 MG tablet Take 40 mg by mouth every evening.   Yes [provider]  ?temozolomide (TEMODAR) 100 MG capsule Take 4  capsules (400 mg total) by mouth daily. May take on an empty stomach to decrease nausea & vomiting. ?Patient taking differently: Take 300 mg/m2/day by mouth daily. May take on an empty stomach to decrease nausea & vomiting. 04/22/21  Yes Vaslow, Acey Lav, MD  ?ALPRAZolam Duanne Moron) 0.25 MG tablet Take 4.5 tablets (1.125 mg total) by mouth daily as needed (Vestibular neuritis). Patient states she takes 4 of the 0.25 mg tablets and then splits one 0.25 mg tablet to make total of 1.125 mg daily for vestibular neuritis ?Patient not taking: Reported on 04/25/2021 12/09/20   Jennye Boroughs, MD  ?dexamethasone (DECADRON) 1 MG tablet Take 2 tablets (2 mg total) by mouth daily. ?Patient not taking: Reported on 04/25/2021 04/22/21   Ventura Sellers, MD  ?   ? ?Allergies    ?Epinephrine, Azithromycin, Motrin [ibuprofen], Ciprofloxacin, and Sulfa antibiotics   ? ?Review of Systems   ?Review of Systems  ?Neurological:  Positive for weakness and numbness.  ?All other systems reviewed and are negative. ? ?Physical Exam ?Updated Vital Signs ?BP 129/83   Pulse 70   Temp 99.2 ?F (37.3 ?C) (Oral)   Resp (!) 23   SpO2 96%  ?Physical Exam ?Vitals and nursing note reviewed.  ?Constitutional:   ?   General: She is not in acute distress. ?   Appearance: Normal appearance. She is ill-appearing.  ?HENT:  ?   Head: Normocephalic and atraumatic.  ?   Comments: Patient with postsurgical changes from craniotomy ?Eyes:  ?   General:     ?  Right eye: No discharge.     ?   Left eye: No discharge.  ?Cardiovascular:  ?   Rate and Rhythm: Normal rate and regular rhythm.  ?   Heart sounds: No murmur heard. ?  No friction rub. No gallop.  ?Pulmonary:  ?   Effort: Pulmonary effort is normal.  ?   Breath sounds: Normal breath sounds.  ?Abdominal:  ?   General: Bowel sounds are normal.  ?   Palpations: Abdomen is soft.  ?Skin: ?   General: Skin is warm and dry.  ?   Capillary Refill: Capillary refill takes less than 2 seconds.  ?Neurological:  ?   Mental  Status: She is alert and oriented to person, place, and time.  ?   Comments: Cranial nerves II through XII grossly intact.  Intact finger-nose, intact heel-to-shin.  Decreased strength on left side 3/5. Right side 5/5. Aox3.  ? ?  ?Psychiatric:     ?   Mood and Affect: Mood normal.     ?   Behavior: Behavior normal.  ? ? ?ED Results / Procedures / Treatments   ?Labs ?(all labs ordered are listed, but only abnormal results are displayed) ?Labs Reviewed  ?CBC WITH DIFFERENTIAL/PLATELET - Abnormal; Notable for the following components:  ?    Result Value  ? Hemoglobin 15.2 (*)   ? HCT 46.1 (*)   ? Lymphs Abs 0.4 (*)   ? All other components within normal limits  ?BASIC METABOLIC PANEL - Abnormal; Notable for the following components:  ? Glucose, Bld 138 (*)   ? All other components within normal limits  ?RESP PANEL BY RT-PCR (FLU A&B, COVID) ARPGX2  ?URINALYSIS, ROUTINE W REFLEX MICROSCOPIC  ? ? ?EKG ?None ? ?Radiology ?DG Chest 2 View ? ?Result Date: 04/25/2021 ?CLINICAL DATA:  Weakness. EXAM: CHEST - 2 VIEW COMPARISON:  Chest CT 11/23/2020 FINDINGS: Patient is rotated. The heart is normal in size. Normal mediastinal contours for degree of rotation. No pulmonary edema, pleural effusion, pneumothorax, or focal airspace disease. The tiny sclerotic focus within T9 on prior CT is not seen by radiograph. No acute osseous findings. IMPRESSION: No acute chest findings. Electronically Signed   By: Keith Rake M.D.   On: 04/25/2021 15:25  ? ?CT Head Wo Contrast ? ?Result Date: 04/25/2021 ?CLINICAL DATA:  Neuro deficit, acute, stroke suspected EXAM: CT HEAD WITHOUT CONTRAST TECHNIQUE: Contiguous axial images were obtained from the base of the skull through the vertex without intravenous contrast. RADIATION DOSE REDUCTION: This exam was performed according to the departmental dose-optimization program which includes automated exposure control, adjustment of the mA and/or kV according to patient size and/or use of iterative  reconstruction technique. COMPARISON:  MRI March 12, 2021. FINDINGS: Brain: Extensive vasogenic edema in the right frontal lobe which appears similar versus mildly progressed, although comparison across modalities is difficult. As seen on the prior MRI, some edema tracks along the corpus callosum involves the left frontal lobe. Mass effect also appears similar versus mildly progressed with slight leftward midline shift. No evidence of acute superimposed large vascular territory infarct, acute hemorrhage, or hydrocephalus. Vascular: No hyperdense vessel identified. Skull: Prior right frontal craniotomy.  No acute fracture. Sinuses/Orbits: Clear sinuses. Other: No mastoid effusions. IMPRESSION: 1. In comparison to MRI from March 12, 2021 similar versus mildly progressed extensive vasogenic edema in the right frontal lobe, corpus callosum and to lesser extent left frontal lobe. Known areas of enhancement are not well evaluated by noncontrast head CT. Comparison across  modalities is limited and a repeat MRI with contrast could provide better direct comparison if clinically warranted. 2. Mass effect also appears similar versus slightly progressed with slight leftward midline shift. Electronically Signed   By: Margaretha Sheffield M.D.   On: 04/25/2021 15:39   ? ?Procedures ?Procedures  ? ? ?Medications Ordered in ED ?Medications  ?enoxaparin (LOVENOX) injection 40 mg (has no administration in time range)  ?acetaminophen (TYLENOL) tablet 650 mg (has no administration in time range)  ?  Or  ?acetaminophen (TYLENOL) suppository 650 mg (has no administration in time range)  ?oxyCODONE (Oxy IR/ROXICODONE) immediate release tablet 5 mg (has no administration in time range)  ?HYDROmorphone (DILAUDID) injection 0.5 mg (has no administration in time range)  ?polyethylene glycol (MIRALAX / GLYCOLAX) packet 17 g (has no administration in time range)  ?senna-docusate (Senokot-S) tablet 1 tablet (has no administration in time range)   ?ondansetron (ZOFRAN) tablet 4 mg (has no administration in time range)  ?  Or  ?ondansetron (ZOFRAN) injection 4 mg (has no administration in time range)  ?dexamethasone (DECADRON) injection 4 mg (has no ad

## 2021-04-25 NOTE — ED Triage Notes (Signed)
Pt arrived via EMS, from home. C/o worsening fatigue.  Per pt, oncology called today and told pt to go to ED. Hx of glioblastoma, is on high dose steroids and states that this increasing fatigue happens periodically with steroid use. Denies any pain. ? ?Hx of left sided deficits at baseline from previous stroke. No change to deficits today.  ?

## 2021-04-25 NOTE — Assessment & Plan Note (Addendum)
Likely due to underlying brain tumor with worsening vasogenic edema and mass effect as noted on CT head and MRI brain.  She has more pronounced weakness in LUE with some degree of left neglect and difficulty performing simple tasks and following instructions although she is oriented x4.  Seems to be improving now. ?-Neuro oncology following. ?-Continue Decadron 4 mg every 8 hours ?-Hold home Temodar per neuro-oncology ?-Continue home Keppra for seizure prophylaxis. ?-Fall precaution ?-PT/OT-recommended SNF. ?

## 2021-04-25 NOTE — Assessment & Plan Note (Signed)
Decadron as above. ?

## 2021-04-25 NOTE — Hospital Course (Addendum)
66 year old F with PMH of glioblastoma s/p craniotomy and resection in 11/2020 with residual left hemiparesis on chemotherapy, HTN, vestibular neuritis  and seizure presenting worsening left-sided weakness, left neglect and fatigue.  CT head and MRI brain concerning for mild interval increase vasogenic edema within right frontal lobe resulting slight increase in mass effect with approximately 4 mm of leftward midline shift.  Neurooncology, Dr. Mickeal Skinner consulted.  Patient was started on IV Decadron.  Seems to be improving.  Neurooncology following.  Therapy recommended SNF. ? ? ?

## 2021-04-25 NOTE — Assessment & Plan Note (Signed)
Likely from brain mass. ?-Continue home care after med rec. ?

## 2021-04-25 NOTE — Assessment & Plan Note (Addendum)
Seems to be on Micardis at home.  Blood pressure elevated. ?-Avapro in place of home Micardis ?-Add hydralazine as needed ?-Consider increasing Avapro if SBP persistently elevated ?

## 2021-04-25 NOTE — Assessment & Plan Note (Addendum)
S/p craniotomy with resection in 11/2020.  Followed by neurooncology. ?-See above ? ?

## 2021-04-26 DIAGNOSIS — Z87891 Personal history of nicotine dependence: Secondary | ICD-10-CM

## 2021-04-26 DIAGNOSIS — R569 Unspecified convulsions: Secondary | ICD-10-CM | POA: Diagnosis present

## 2021-04-26 DIAGNOSIS — Z8249 Family history of ischemic heart disease and other diseases of the circulatory system: Secondary | ICD-10-CM | POA: Diagnosis not present

## 2021-04-26 DIAGNOSIS — M6250 Muscle wasting and atrophy, not elsewhere classified, unspecified site: Secondary | ICD-10-CM | POA: Diagnosis present

## 2021-04-26 DIAGNOSIS — Z85828 Personal history of other malignant neoplasm of skin: Secondary | ICD-10-CM | POA: Diagnosis not present

## 2021-04-26 DIAGNOSIS — R414 Neurologic neglect syndrome: Secondary | ICD-10-CM | POA: Diagnosis present

## 2021-04-26 DIAGNOSIS — Z79899 Other long term (current) drug therapy: Secondary | ICD-10-CM | POA: Diagnosis not present

## 2021-04-26 DIAGNOSIS — F40240 Claustrophobia: Secondary | ICD-10-CM | POA: Diagnosis present

## 2021-04-26 DIAGNOSIS — Z66 Do not resuscitate: Secondary | ICD-10-CM | POA: Diagnosis present

## 2021-04-26 DIAGNOSIS — Z801 Family history of malignant neoplasm of trachea, bronchus and lung: Secondary | ICD-10-CM | POA: Diagnosis not present

## 2021-04-26 DIAGNOSIS — Z20822 Contact with and (suspected) exposure to covid-19: Secondary | ICD-10-CM | POA: Diagnosis present

## 2021-04-26 DIAGNOSIS — H8122 Vestibular neuronitis, left ear: Secondary | ICD-10-CM | POA: Diagnosis present

## 2021-04-26 DIAGNOSIS — R531 Weakness: Secondary | ICD-10-CM | POA: Diagnosis not present

## 2021-04-26 DIAGNOSIS — G936 Cerebral edema: Secondary | ICD-10-CM | POA: Diagnosis present

## 2021-04-26 DIAGNOSIS — G8194 Hemiplegia, unspecified affecting left nondominant side: Secondary | ICD-10-CM | POA: Diagnosis present

## 2021-04-26 DIAGNOSIS — Z886 Allergy status to analgesic agent status: Secondary | ICD-10-CM | POA: Diagnosis not present

## 2021-04-26 DIAGNOSIS — I10 Essential (primary) hypertension: Secondary | ICD-10-CM | POA: Diagnosis present

## 2021-04-26 DIAGNOSIS — Z9221 Personal history of antineoplastic chemotherapy: Secondary | ICD-10-CM | POA: Diagnosis not present

## 2021-04-26 DIAGNOSIS — C719 Malignant neoplasm of brain, unspecified: Secondary | ICD-10-CM | POA: Diagnosis present

## 2021-04-26 DIAGNOSIS — Z881 Allergy status to other antibiotic agents status: Secondary | ICD-10-CM | POA: Diagnosis not present

## 2021-04-26 DIAGNOSIS — Z882 Allergy status to sulfonamides status: Secondary | ICD-10-CM | POA: Diagnosis not present

## 2021-04-26 DIAGNOSIS — Z888 Allergy status to other drugs, medicaments and biological substances status: Secondary | ICD-10-CM | POA: Diagnosis not present

## 2021-04-26 NOTE — Evaluation (Signed)
Physical Therapy Evaluation ?Patient Details ?Name: Erika Mitchell ?MRN: [id number] ?DOB: [birth year] ?Today's Date: 04/26/2021 ? ?History of Present Illness ? Patient is a 66 year old female who presented with left sided weakness. WUJ:[id number] s/p craniotomy and resection in 11/2020 with residual left hemiparesis on chemotherapy, HTN, vestibular neuritis  and seizure  ?Clinical Impression ? Pt admitted with above diagnosis. Min assist for bed mobility and sit to stand transfer, Stedy used to pivot to recliner. Pt is pleasant, oriented x 4, but not able to provide prior level of function nor participate in setting goals. ST-SNF if family unable to provide care.  Pt currently with functional limitations due to the deficits listed below (see PT Problem List). Pt will benefit from skilled PT to increase their independence and safety with mobility to allow discharge to the venue listed below.   ?   ?   ? ?Recommendations for follow up therapy are one component of a multi-disciplinary discharge planning process, led by the attending physician.  Recommendations may be updated based on patient status, additional functional criteria and insurance authorization. ? ?Follow Up Recommendations Skilled nursing-short term rehab (<3 hours/day) ? ?  ?Assistance Recommended at Discharge Intermittent Supervision/Assistance  ?Patient can return home with the following ? A lot of help with walking and/or transfers;A lot of help with bathing/dressing/bathroom;Assistance with cooking/housework;Direct supervision/assist for medications management;Assist for transportation;Help with stairs or ramp for entrance ? ?  ?Equipment Recommendations Wheelchair if DC home  ?Recommendations for Other Services ?    ?  ?Functional Status Assessment Patient has had a recent decline in their functional status and demonstrates the ability to make significant improvements in function in a reasonable and predictable amount of time.  ? ?  ?Precautions /  Restrictions Precautions ?Precautions: Fall ?Precaution Comments: L side weakness, seizures ?Restrictions ?Weight Bearing Restrictions: No  ? ?  ? ?Mobility ? Bed Mobility ?  ?  ?  ?  ?Supine to sit: Min assist, HOB elevated ?  ?  ?General bed mobility comments: min A to raise trunk and pivot hips to EOB, HOB up, used rail ?  ? ?Transfers ?Overall transfer level: Needs assistance ?  ?Transfers: Sit to/from Stand, Bed to chair/wheelchair/BSC ?Sit to Stand: Min assist, From elevated surface ?  ?  ?  ?  ?  ?General transfer comment: min A to power up, manual assist to place LUE on Stedy bar (pt able to maintain grip on bar), pt able to come to full stand, pivoted using Stedy ?Transfer via Lift Equipment: Stedy ? ?Ambulation/Gait ?  ?  ?  ?  ?  ?  ?  ?  ? ?Stairs ?  ?  ?  ?  ?  ? ?Wheelchair Mobility ?  ? ?Modified Rankin (Stroke Patients Only) ?  ? ?  ? ?Balance Overall balance assessment: Needs assistance ?Sitting-balance support: Feet supported, No upper extremity supported ?Sitting balance-Leahy Scale: Fair ?  ?  ?Standing balance support: Reliant on assistive device for balance, Bilateral upper extremity supported ?Standing balance-Leahy Scale: Poor ?  ?  ?  ?  ?  ?  ?  ?  ?  ?  ?  ?  ?   ? ? ? ?Pertinent Vitals/Pain Pain Assessment ?Pain Assessment: No/denies pain  ? ? ?Home Living Family/patient expects to be discharged to:: Private residence ?Living Arrangements: Spouse/significant other ?Available Help at Discharge: Family;Available 24 hours/day ?Type of Home: House ?Home Access: Stairs to enter ?Entrance Stairs-Rails: Right;Left;Can reach both ?Entrance Stairs-Number of  Steps: about 6 ?  ?Home Layout: Able to live on main level with bedroom/bathroom ?Home Equipment: Shower Land (2 wheels);BSC/3in1 ?   ?  ?Prior Function Prior Level of Function : Independent/Modified Independent ?  ?  ?  ?  ?  ?  ?  ?  ?  ? ? ?Hand Dominance  ? Dominant Hand: Left ? ?  ?Extremity/Trunk Assessment  ? Upper  Extremity Assessment ?Upper Extremity Assessment: Defer to OT evaluation ?  ? ?Lower Extremity Assessment ?Lower Extremity Assessment: LLE deficits/detail;Difficult to assess due to impaired cognition ?LLE Deficits / Details: ankle DF 1/5 (? if limited by cognition), knee ext -4/5, hip flexion 3/5 ?LLE Sensation: WNL ?  ? ?Cervical / Trunk Assessment ?Cervical / Trunk Assessment: Normal  ?Communication  ? Communication: Expressive difficulties  ?Cognition Arousal/Alertness: Awake/alert ?Behavior During Therapy: Flat affect ?Overall Cognitive Status: No family/caregiver present to determine baseline cognitive functioning ?  ?  ?  ?  ?  ?  ?  ?  ?  ?  ?  ?  ?  ?  ?  ?  ?General Comments: oriented x 4 but slow to respond, not able to provide prior level of function (this was obtained from OT eval), husband not present ?  ?  ? ?  ?General Comments   ? ?  ?Exercises    ? ?Assessment/Plan  ?  ?PT Assessment Patient needs continued PT services  ?PT Problem List Decreased strength;Decreased activity tolerance;Decreased balance;Decreased mobility ? ?   ?  ?PT Treatment Interventions Therapeutic activities;Therapeutic exercise;Gait training;Functional mobility training;Patient/family education   ? ?PT Goals (Current goals can be found in the Care Plan section)  ?Acute Rehab PT Goals ?PT Goal Formulation: Patient unable to participate in goal setting ?Time For Goal Achievement: 05/10/21 ?Potential to Achieve Goals: Fair ? ?  ?Frequency Min 2X/week ?  ? ? ?Co-evaluation   ?  ?  ?  ?  ? ? ?  ?AM-PAC PT "6 Clicks" Mobility  ?Outcome Measure Help needed turning from your back to your side while in a flat bed without using bedrails?: A Little ?Help needed moving from lying on your back to sitting on the side of a flat bed without using bedrails?: A Little ?Help needed moving to and from a bed to a chair (including a wheelchair)?: A Lot ?Help needed standing up from a chair using your arms (e.g., wheelchair or bedside chair)?: A  Lot ?Help needed to walk in hospital room?: Total ?Help needed climbing 3-5 steps with a railing? : Total ?6 Click Score: 12 ? ?  ?End of Session   ?Activity Tolerance: Patient tolerated treatment well ?Patient left: in chair;with call bell/phone within reach;with chair alarm set ?Nurse Communication: Mobility status ?PT Visit Diagnosis: Unsteadiness on feet (R26.81);Difficulty in walking, not elsewhere classified (R26.2);Hemiplegia and hemiparesis ?Hemiplegia - Right/Left: Left ?Hemiplegia - dominant/non-dominant: Dominant ?Hemiplegia - caused by: Other cerebrovascular disease ?  ? ?Time: 5329-9242 ?PT Time Calculation (min) (ACUTE ONLY): 13 min ? ? ?Charges:   PT Evaluation ?$PT Eval Moderate Complexity: 1 Mod ?  ?  ?   ? ?Blondell Reveal Kistler PT 04/26/2021  ?Acute Rehabilitation Services ?Pager [phone] ?Office [phone] ? ? ?

## 2021-04-26 NOTE — Progress Notes (Signed)
PT Cancellation Note ? ?Patient Details ?Name: Erika Mitchell ?MRN: [id number] ?DOB: 1955/03/06 ? ? ?Cancelled Treatment:    Reason Eval/Treat Not Completed: Fatigue/lethargy limiting ability to participate (pt sleeping soundly, spouse requested PT attempt later. Will follow.) ? ?Blondell Reveal Kistler PT 04/26/2021  ?Acute Rehabilitation Services ?Pager [phone] ?Office [phone] ? ?

## 2021-04-26 NOTE — Assessment & Plan Note (Signed)
-  Continue home Xanax ?

## 2021-04-26 NOTE — Consult Note (Signed)
[place] ?Neuro-Oncology Consult Note ? ?Patient Care Team: ?Ginger Organ., MD as PCP - General (Internal Medicine) ? ?CHIEF COMPLAINTS/PURPOSE OF CONSULTATION:  ?Glioblastoma, Left Side Weakness ? ?HISTORY OF PRESENTING ILLNESS:  ?Erika Mitchell 66 y.o. female presented with several days history of worsening left sided weakness, increased from prior/baseline.  Upon presentation, she was non ambulatory and with no functional use of the left arm.  She has also been ignoring things on the left side in recent days.  No clear improvement since yesterday despite increasing the decadron; prior to admission was on '4mg'$  daily.  Didn't start the second cycle of Temodar yet. ? ?MEDICAL HISTORY:  ?Past Medical History:  ?Diagnosis Date  ? Cancer [facility])   ? basal cell carcinoma on left arm left eye brow  ? Hypertension   ? Vertigo   ? 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 ? ?SURGICAL HISTORY: ?Past Surgical History:  ?Procedure Laterality Date  ? APPLICATION OF CRANIAL NAVIGATION Right 11/25/2020  ? Procedure: APPLICATION OF CRANIAL NAVIGATION;  Surgeon: Judith Part, MD;  Location: [place];  Service: Neurosurgery;  Laterality: Right;  ? CHOLECYSTECTOMY  2017  ? CRANIOTOMY Right 11/25/2020  ? Procedure: CRANIOTOMY FOR  TUMOR RESECTION WITH Lucky Rathke;  Surgeon: Judith Part, MD;  Location: [place];  Service: Neurosurgery;  Laterality: Right;  ? laproscopy    ? surgery for endometriosisi  ? OOPHORECTOMY  [id number]  ? rt then left ovary  ? ? ?SOCIAL HISTORY: ?Social History  ? ?Socioeconomic History  ? Marital status: Married  ?  Spouse name: Erika Mitchell  ? Number of children: 1  ? Years of education: 11  ? Highest education level: Not on file  ?Occupational History  ?  Employer: [employer]  ?Tobacco Use  ? Smoking status: Former  ?  Types: Cigarettes  ?  Quit date: 02/08/1996  ?  Years since quitting: 25.2  ? Smokeless tobacco: Never  ?Vaping Use  ? Vaping Use: Never used  ?Substance and Sexual  Activity  ? Alcohol use: Not Currently  ?  Alcohol/week: 2.0 standard drinks  ?  Types: 2 Glasses of wine per week  ? Drug use: No  ? Sexual activity: Not Currently  ?Other Topics Concern  ? Not on file  ?Social History Narrative  ? Patient is married Erika Mitchell).    ? Patient is left-handed.  ? Patient is working full-time.  ? Patient has a college education (Associate's)  ? Patient has one child.  ? Patient drinks 3 Cups of coffee daily.  ? ?Social Determinants of Health  ? ?Financial Resource Strain: Not on file  ?Food Insecurity: Not on file  ?Transportation Needs: Not on file  ?Physical Activity: Not on file  ?Stress: Not on file  ?Social Connections: Not on file  ?Intimate Partner Violence: Not on file  ? ? ?FAMILY HISTORY: ?Family History  ?Problem Relation Age of Onset  ? Lung cancer Mother   ? Aneurysm Father   ? Heart disease Sister   ? Atrial fibrillation Brother   ? Colon cancer Neg Hx   ? ? ?ALLERGIES:  is allergic to epinephrine, azithromycin, motrin [ibuprofen], ciprofloxacin, and sulfa antibiotics. ? ?MEDICATIONS:  ?Current Facility-Administered Medications  ?Medication Dose Route Frequency Provider Last Rate Last Admin  ? acetaminophen (TYLENOL) tablet 650 mg  650 mg Oral Q6H PRN Wendee Beavers T, MD      ? Or  ? acetaminophen (TYLENOL) suppository 650 mg  650  mg Rectal Q6H PRN Mercy Riding, MD      ? ALPRAZolam Duanne Moron) tablet 1.125 mg  1.125 mg Oral Daily PRN Gonfa, Taye T, MD      ? dexamethasone (DECADRON) injection 4 mg  4 mg Intravenous Q8H Gonfa, Taye T, MD   4 mg at 04/26/21 1309  ? enoxaparin (LOVENOX) injection 40 mg  40 mg Subcutaneous Q24H Wendee Beavers T, MD   40 mg at 04/26/21 0849  ? HYDROmorphone (DILAUDID) injection 0.5 mg  0.5 mg Intravenous Q3H PRN Wendee Beavers T, MD      ? irbesartan (AVAPRO) tablet 150 mg  150 mg Oral Daily Wendee Beavers T, MD   150 mg at 04/26/21 0849  ? levETIRAcetam (KEPPRA) tablet 1,000 mg  1,000 mg Oral BID Wendee Beavers T, MD   1,000 mg at 04/26/21 0850  ? ondansetron  (ZOFRAN) tablet 4 mg  4 mg Oral Q6H PRN Mercy Riding, MD      ? Or  ? ondansetron (ZOFRAN) injection 4 mg  4 mg Intravenous Q6H PRN Gonfa, Taye T, MD      ? oxyCODONE (Oxy IR/ROXICODONE) immediate release tablet 5 mg  5 mg Oral Q6H PRN Gonfa, Taye T, MD      ? polyethylene glycol (MIRALAX / GLYCOLAX) packet 17 g  17 g Oral BID PRN Gonfa, Taye T, MD      ? senna-docusate (Senokot-S) tablet 1 tablet  1 tablet Oral BID PRN Mercy Riding, MD      ? ? ?REVIEW OF SYSTEMS:   ?Constitutional: Denies fevers, chills or abnormal weight loss ?Eyes: Denies blurriness of vision ?Ears, nose, mouth, throat, and face: Denies mucositis or sore throat ?Respiratory: Denies cough, dyspnea or wheezes ?Cardiovascular: Denies palpitation, chest discomfort or lower extremity swelling ?Gastrointestinal:  Denies nausea, constipation, diarrhea ?GU: Denies dysuria or incontinence ?Skin: Denies abnormal skin rashes ?Neurological: Per HPI ?Musculoskeletal: Denies joint pain, back or neck discomfort. No decrease in ROM ?Behavioral/Psych: Denies anxiety, disturbance in thought content, and mood instability ? ? ?PHYSICAL EXAMINATION: ?Vitals:  ? 04/26/21 0832 04/26/21 1321  ?BP: 129/78 132/83  ?Pulse: 75 73  ?Resp: 16 16  ?Temp: 98.6 ?F (37 ?C) 98 ?F (36.7 ?C)  ?SpO2: 95% 97%  ? ?KPS: 60. ?General: Alert, cooperative, pleasant, in no acute distress ?Head: Craniotomy scar noted, dry and intact. ?EENT: No conjunctival injection or scleral icterus. Oral mucosa moist ?Lungs: Resp effort normal ?Cardiac: Regular rate and rhythm ?Abdomen: Soft, non-distended abdomen ?Skin: No rashes cyanosis or petechiae. ?Extremities: No clubbing or edema ? ?NEUROLOGIC EXAM: ?Mental Status: Awake, alert, attentive to examiner. Oriented to self and environment. Language is fluent with intact comprehension.  Dense left sided neglect. ?Cranial Nerves: Visual acuity is grossly normal. Visual fields are full. Extra-ocular movements intact. No ptosis. Face is symmetric,  tongue midline. ?Motor: Tone and bulk are normal. Power is 3/5 in left arm and leg. Reflexes are symmetric, no pathologic reflexes present. Intact finger to nose bilaterally ?Sensory: Intact to light touch and temperature ?Gait: Non ambulatory ? ? ?LABORATORY DATA:  ?I have reviewed the data as listed ?Lab Results  ?Component Value Date  ? WBC 7.2 04/25/2021  ? HGB 15.2 (H) 04/25/2021  ? HCT 46.1 (H) 04/25/2021  ? MCV 92.9 04/25/2021  ? PLT 176 04/25/2021  ? ?Recent Labs  ?  02/02/21 ?1054 02/15/21 ?0856 04/22/21 ?1149 04/25/21 ?1412  ?NA 141 140 141 139  ?K 3.7 3.8 3.4* 3.9  ?CL 107 109 107  106  ?CO2 '27 24 29 25  '$ ?GLUCOSE 105* 97 77 138*  ?BUN '10 11 20 15  '$ ?CREATININE 0.79 0.87 0.89 0.78  ?CALCIUM 9.6 9.4 9.2 9.8  ?GFRNONAA >60 >60 >60 >60  ?PROT 6.3* 6.3* 6.2*  --   ?ALBUMIN 4.0 4.0 4.0  --   ?AST '17 16 17  '$ --   ?ALT 31 21 41  --   ?ALKPHOS 45 44 33*  --   ?BILITOT 0.5 0.7 0.5  --   ? ? ?RADIOGRAPHIC STUDIES: ?I have personally reviewed the radiological images as listed and agreed with the findings in the report. ?DG Chest 2 View ? ?Result Date: 04/25/2021 ?CLINICAL DATA:  Weakness. EXAM: CHEST - 2 VIEW COMPARISON:  Chest CT 11/23/2020 FINDINGS: Patient is rotated. The heart is normal in size. Normal mediastinal contours for degree of rotation. No pulmonary edema, pleural effusion, pneumothorax, or focal airspace disease. The tiny sclerotic focus within T9 on prior CT is not seen by radiograph. No acute osseous findings. IMPRESSION: No acute chest findings. Electronically Signed   By: Keith Rake M.D.   On: 04/25/2021 15:25  ? ?CT Head Wo Contrast ? ?Result Date: 04/25/2021 ?CLINICAL DATA:  Neuro deficit, acute, stroke suspected EXAM: CT HEAD WITHOUT CONTRAST TECHNIQUE: Contiguous axial images were obtained from the base of the skull through the vertex without intravenous contrast. RADIATION DOSE REDUCTION: This exam was performed according to the departmental dose-optimization program which includes automated  exposure control, adjustment of the mA and/or kV according to patient size and/or use of iterative reconstruction technique. COMPARISON:  MRI March 12, 2021. FINDINGS: Brain: Extensive vasogenic edema in

## 2021-04-26 NOTE — Evaluation (Signed)
Occupational Therapy Evaluation ?Patient Details ?Name: Erika Mitchell ?MRN: [id number] ?DOB: [birth year] ?Today's Date: 04/26/2021 ? ? ?History of Present Illness Patient is a 66 year old female who presented with left sided weakness. PPJ:[id number] s/p craniotomy and resection in 11/2020 with residual left hemiparesis on chemotherapy, HTN, vestibular neuritis  and seizure  ? ?Clinical Impression ?  ?Patient is a 66 year old female who  was admitted for above. Patient was noted to have increased L sided weakness on this date with poor functional activity tolerance. Patient was able to participate in transfer to [facility] with mod A with consistent multimodal cues for sequencing and safety but needed +2 to transfer back towards L side with increased leaning and weakness on L side noted. Patient noted to have L side neglect with increased cues and time needed to attend to L side with tasks. Patient was noted to have decreased strength, decreased functional activity tolerance, decreased endurance, decreased safety awareness and decreased balance impacting participation in ADLs. Patient would need 24/7 physical assistance in next level of care to be successful at this time. Patient would continue to benefit from skilled OT services at this time while admitted and after d/c to address noted deficits in order to improve overall safety and independence in ADLs.  ? ?   ? ?Recommendations for follow up therapy are one component of a multi-disciplinary discharge planning process, led by the attending physician.  Recommendations may be updated based on patient status, additional functional criteria and insurance authorization.  ? ?Follow Up Recommendations ? Skilled nursing-short term rehab (<3 hours/day)  ?  ?Assistance Recommended at Discharge Frequent or constant Supervision/Assistance  ?Patient can return home with the following A lot of help with walking and/or transfers;A lot of help with bathing/dressing/bathroom;Assistance  with cooking/housework;Direct supervision/assist for financial management;Assist for transportation;Help with stairs or ramp for entrance;Direct supervision/assist for medications management ? ?  ?Functional Status Assessment ? Patient has had a recent decline in their functional status and demonstrates the ability to make significant improvements in function in a reasonable and predictable amount of time.  ?Equipment Recommendations ?    ?  ?Recommendations for Other Services   ? ? ?  ?Precautions / Restrictions Precautions ?Precautions: Fall ?Precaution Comments: L side weakness, seizures ?Restrictions ?Weight Bearing Restrictions: No  ? ?  ? ?Mobility Bed Mobility ?Overal bed mobility: Needs Assistance ?Bed Mobility: Supine to Sit, Sit to Supine ?  ?  ?Supine to sit: Mod assist, HOB elevated ?Sit to supine: Mod assist, +2 for safety/equipment, +2 for physical assistance ?  ?General bed mobility comments: patient needed consistent cues for sequencing of supine to sit on edge of bed with patient having delays in responding to cues. patient needed increased physical assistance to transfer from sit to supine with increased fatigue and L side leaning noted. ?  ? ?Transfers ?  ?  ?  ?  ?  ?  ?  ?  ?  ?  ?  ? ?  ?Balance Overall balance assessment: Needs assistance ?Sitting-balance support: Feet supported, No upper extremity supported ?Sitting balance-Leahy Scale: Fair ?  ?  ?Standing balance support: Reliant on assistive device for balance, Bilateral upper extremity supported ?Standing balance-Leahy Scale: Poor ?  ?  ?  ?  ?  ?  ?  ?  ?  ?  ?  ?  ?   ? ?ADL either performed or assessed with clinical judgement  ? ?ADL Overall ADL's : Needs assistance/impaired ?Eating/Feeding: Minimal assistance;Sitting ?  ?  Grooming: Wash/dry face;Minimal assistance;Sitting ?  ?Upper Body Bathing: Moderate assistance;Sitting ?  ?Lower Body Bathing: Sit to/from stand;Sitting/lateral leans;Moderate assistance ?  ?Upper Body Dressing :  Sitting;Minimal assistance ?  ?Lower Body Dressing: Sit to/from stand;Sitting/lateral leans;Moderate assistance ?  ?Toilet Transfer: Moderate assistance;+2 for physical assistance;+2 for safety/equipment ?Toilet Transfer Details (indicate cue type and reason): patient was able to transfer to R side with mod A x 1 with RW with consistent multimodal cues for sequencing and physical assist for weight shifting. patient attempted to to transfer back to bed per patient request after education on sitting up out of bed was provided. patient was max A for LOB with patient attempting to wave at husband while attempting to step in transfer and returned to [facility]. patient was mod A +2 to transfer to L side with increased fatigue noted with increased difficulty with attending to L side for movements. patient had decreased ability to move BLE when transfering to L side. ?Toileting- Water quality scientist and Hygiene: Sit to/from stand;Moderate assistance ?Toileting - Clothing Manipulation Details (indicate cue type and reason): patient was able to stand and complete hygiene after voiding bladder with min A and min A for clothign management ?  ?  ?Functional mobility during ADLs: Moderate assistance;+2 for physical assistance;+2 for safety/equipment ?   ? ? ? ?Vision Baseline Vision/History: 1 Wears glasses ?Additional Comments: patient reported having blurred vision some times on and off.  ?   ?Perception   ?  ?Praxis   ?  ? ?Pertinent Vitals/Pain Pain Assessment ?Pain Assessment: No/denies pain  ? ? ? ?Hand Dominance Left ?  ?Extremity/Trunk Assessment Upper Extremity Assessment ?Upper Extremity Assessment: LUE deficits/detail;RUE deficits/detail ?RUE Deficits / Details: ROM WFL, patient was 3+/5 in grip strength. patient was 4/5 in shoulder strenght ?LUE Deficits / Details: patient was noted to have L side neglect, with ability to ROM elbow 90 to about 40 degrees extension AROM. patient was able to grossly grasp digits when asked  to sequeeze with no power being movements. ?LUE Coordination: decreased fine motor;decreased gross motor ?  ?Lower Extremity Assessment ?Lower Extremity Assessment: Defer to PT evaluation ?  ?Cervical / Trunk Assessment ?Cervical / Trunk Assessment: Normal ?  ?Communication Communication ?Communication: Expressive difficulties ?  ?Cognition Arousal/Alertness: Awake/alert ?Behavior During Therapy: Flat affect ?Overall Cognitive Status: Difficult to assess ?  ?  ?  ?  ?  ?  ?  ?  ?  ?  ?  ?  ?  ?  ?  ?  ?General Comments: patient was delayed for responses to questions with patient looking to husband for all answers. husband was able to assist with providing PLOF. ?  ?  ?General Comments    ? ?  ?Exercises   ?  ?Shoulder Instructions    ? ? ?Home Living Family/patient expects to be discharged to:: Private residence ?Living Arrangements: Spouse/significant other ?Available Help at Discharge: Family;Available 24 hours/day ?Type of Home: House ?Home Access: Stairs to enter ?Entrance Stairs-Number of Steps: about 6 ?Entrance Stairs-Rails: Right;Left;Can reach both ?Home Layout: Able to live on main level with bedroom/bathroom ?  ?  ?Bathroom Shower/Tub: Tub/shower unit;Walk-in shower ?  ?Bathroom Toilet: Handicapped height ?Bathroom Accessibility: Yes ?How Accessible: Accessible via walker ?Home Equipment: Shower Land (2 wheels);BSC/3in1 ?  ?  ?  ? ?  ?Prior Functioning/Environment Prior Level of Function : Independent/Modified Independent ?  ?  ?  ?  ?  ?  ?  ?  ?  ? ?  ?  ?  OT Problem List: Decreased strength;Decreased activity tolerance;Impaired balance (sitting and/or standing);Decreased safety awareness;Decreased knowledge of precautions;Decreased knowledge of use of DME or AE;Impaired UE functional use ?  ?   ?OT Treatment/Interventions: Self-care/ADL training;Therapeutic exercise;Neuromuscular education;Energy conservation;DME and/or AE instruction;Therapeutic activities;Balance training;Patient/family  education  ?  ?OT Goals(Current goals can be found in the care plan section) Acute Rehab OT Goals ?Patient Stated Goal: to listen to country music ?OT Goal Formulation: With patient/family ?Time For Goal

## 2021-04-26 NOTE — Progress Notes (Signed)
?PROGRESS NOTE ? ?Erika Mitchell HUD:[id number] DOB: 01/16/1956  ? ?PCP: Ginger Organ., MD ? ?Patient is from: Home.  Patient lives with husband. ? ?DOA: 04/25/2021 LOS: 0 ? ?Chief complaints ?Chief Complaint  ?Patient presents with  ? Weakness  ?  ? ?Brief Narrative / Interim history: ?66 year old F with PMH of glioblastoma s/p craniotomy and resection in 11/2020 with residual left hemiparesis on chemotherapy, HTN, vestibular neuritis  and seizure presenting worsening left-sided weakness and fatigue.  CT head and MRI brain concerning for mild interval increase vasogenic edema within right frontal lobe resulting slight increase in mass effect with approximately 4 mm of leftward midline shift.  Neurooncology, Dr. Mickeal Skinner consulted.  Patient was started on IV Decadron ? ?  ? ?Subjective: ?Seen and examined earlier this morning.  No major events overnight of this morning.  No new complaints.  Still with left-sided weakness more in left arm.  No other complaints.  She denies headache, vision change, shortness of breath, chest pain, GI or UTI symptoms.  She is oriented x4 but not a great historian. ? ?Objective: ?Vitals:  ? 04/25/21 1857 04/25/21 2349 04/26/21 0531 04/26/21 8588  ?BP: (!) 142/85 (!) 143/88 (!) 141/83 129/78  ?Pulse: 74 76 79 75  ?Resp: '16 18 16 16  ' ?Temp: 98.1 ?F (36.7 ?C) (!) 97.4 ?F (36.3 ?C) 98.3 ?F (36.8 ?C) 98.6 ?F (37 ?C)  ?TempSrc: Oral Oral Oral Oral  ?SpO2: 96% 95% 95% 95%  ? ? ?Examination: ? ?GENERAL: No apparent distress.  Nontoxic. ?HEENT: MMM.  Vision and hearing grossly intact.  ?NECK: Supple.  No apparent JVD.  ?RESP:  No IWOB.  Fair aeration bilaterally. ?CVS:  RRR. Heart sounds normal.  ?ABD/GI/GU: BS+. Abd soft, NTND.  ?MSK/EXT:  Moves extremities. No apparent deformity. No edema.  ?SKIN: no apparent skin lesion or wound ?NEURO: Awake, alert and oriented x4 but difficulty comprehending instructions and following commands.  Motor 3+/5 in LUE, 4+/5 in LLE.  Left pronator drift.   ?PSYCH: Calm. Normal affect.  ? ?Procedures:  ?None ? ?Microbiology summarized: ?COVID-19 and influenza PCR nonreactive. ? ?Assessment and Plan: ?* Acute on chronic left-sided weakness ?Likely due to underlying brain tumor with worsening vasogenic edema and mass effect as noted on CT head and MRI brain.  She has more pronounced weakness in LUE with some degree of left neglect and difficulty performing simple tasks and following instructions although she is oriented x4..  ?-Patient's neuro oncologist recommended continuing Decadron 4 mg every 8 hours ?-Hold home Temodar per neuro-oncology pending evaluation. ?-Continue home Keppra for seizure prophylaxis. ?-Fall precaution, PT/OT eval ? ?Glioblastoma, IDH-wildtype ([place]) ?S/p craniotomy with resection in 11/2020.  Followed by neurooncology. ?-See above ? ? ?Seizures ([place]) ?Likely from brain mass. ?-Continue home care after med rec. ? ?Cerebral edema ([facility]) ?Decadron as above. ? ?Essential hypertension ?Seems to be on Micardis at home. ?-Avapro in place of home Micardis ? 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 ? ? ? ? ?  ? ?There is no height or weight on file to calculate BMI. ?  ?  ?  ?  ?DVT prophylaxis:  ?enoxaparin (LOVENOX) injection 40 mg Start: 04/26/21 0800 ? ?Code Status: DNR/DNI ?Family Communication: Updated patient's husband at bedside. ?Level of care: Med-Surg ?Status is: Observation ? ?The patient will require care spanning > 2 midnights and should be moved to inpatient because: Due to brain tumor with vasogenic edema and left hemiparesis requiring IV steroid and further evaluation by neurooncology ? ? ?  Final disposition: Therapy recommended SNF. ? ?Consultants:  ?Neuro oncology ? ?Sch Meds:  ?Scheduled Meds: ? dexamethasone (DECADRON) injection  4 mg Intravenous Q8H  ? enoxaparin (LOVENOX) injection  40 mg Subcutaneous Q24H  ? irbesartan  150 mg Oral Daily  ? levETIRAcetam  1,000 mg Oral BID  ? ?Continuous Infusions: ?PRN  Meds:.acetaminophen **OR** acetaminophen, ALPRAZolam, HYDROmorphone (DILAUDID) injection, ondansetron **OR** ondansetron (ZOFRAN) IV, oxyCODONE, polyethylene glycol, senna-docusate ? ?Antimicrobials: ?Anti-infectives (From admission, onward)  ? ? None  ? ?  ? ? ? ?I have personally reviewed the following labs and images: ?CBC: ?Recent Labs  ?Lab 04/22/21 ?1149 04/25/21 ?1412  ?WBC 5.7 7.2  ?NEUTROABS 3.7 6.7  ?HGB 13.6 15.2*  ?HCT 41.8 46.1*  ?MCV 93.3 92.9  ?PLT 188 176  ? ?BMP &GFR ?Recent Labs  ?Lab 04/22/21 ?1149 04/25/21 ?1412  ?NA 141 139  ?K 3.4* 3.9  ?CL 107 106  ?CO2 29 25  ?GLUCOSE 77 138*  ?BUN 20 15  ?CREATININE 0.89 0.78  ?CALCIUM 9.2 9.8  ? ?Estimated Creatinine Clearance: 80.6 mL/min (by C-G formula based on SCr of 0.78 mg/dL). ?Liver & Pancreas: ?Recent Labs  ?Lab 04/22/21 ?1149  ?AST 17  ?ALT 41  ?ALKPHOS 33*  ?BILITOT 0.5  ?PROT 6.2*  ?ALBUMIN 4.0  ? ?No results for input(s): LIPASE, AMYLASE in the last 168 hours. ?No results for input(s): AMMONIA in the last 168 hours. ?Diabetic: ?No results for input(s): HGBA1C in the last 72 hours. ?No results for input(s): GLUCAP in the last 168 hours. ?Cardiac Enzymes: ?No results for input(s): CKTOTAL, CKMB, CKMBINDEX, TROPONINI in the last 168 hours. ?No results for input(s): PROBNP in the last 8760 hours. ?Coagulation Profile: ?No results for input(s): INR, PROTIME in the last 168 hours. ?Thyroid Function Tests: ?No results for input(s): TSH, T4TOTAL, FREET4, T3FREE, THYROIDAB in the last 72 hours. ?Lipid Profile: ?No results for input(s): CHOL, HDL, LDLCALC, TRIG, CHOLHDL, LDLDIRECT in the last 72 hours. ?Anemia Panel: ?No results for input(s): VITAMINB12, FOLATE, FERRITIN, TIBC, IRON, RETICCTPCT in the last 72 hours. ?Urine analysis: ?   ?Component Value Date/Time  ? COLORURINE STRAW (A) 11/23/2020 1652  ? APPEARANCEUR CLEAR 11/23/2020 1652  ? LABSPEC 1.006 11/23/2020 1652  ? PHURINE 5.0 11/23/2020 1652  ? GLUCOSEU NEGATIVE 11/23/2020 1652  ? [place]  NEGATIVE 11/23/2020 1652  ? [place] NEGATIVE 11/23/2020 1652  ? [place] NEGATIVE 11/23/2020 1652  ? PROTEINUR NEGATIVE 11/23/2020 1652  ? NITRITE NEGATIVE 11/23/2020 1652  ? LEUKOCYTESUR NEGATIVE 11/23/2020 1652  ? ?Sepsis Labs: ?Invalid input(s): PROCALCITONIN, LACTICIDVEN ? ?Microbiology: ?Recent Results (from the past 240 hour(s))  ?Resp Panel by RT-PCR (Flu A&B, Covid) Nasopharyngeal Swab     Status: None  ? Collection Time: 04/25/21  2:27 PM  ? Specimen: Nasopharyngeal Swab; Nasopharyngeal(NP) swabs in vial transport medium  ?Result Value Ref Range Status  ? SARS Coronavirus 2 by RT PCR NEGATIVE NEGATIVE Final  ?  Comment: (NOTE) ?SARS-CoV-2 target nucleic acids are NOT DETECTED. ? ?The SARS-CoV-2 RNA is generally detectable in upper respiratory ?specimens during the acute phase of infection. The lowest ?concentration of SARS-CoV-2 viral copies this assay can detect is ?138 copies/mL. A negative result does not preclude SARS-Cov-2 ?infection and should not be used as the sole basis for treatment or ?other patient management decisions. A negative result may occur with  ?improper specimen collection/handling, submission of specimen other ?than nasopharyngeal swab, presence of viral mutation(s) within the ?areas targeted by this assay, and inadequate number of viral ?copies(<138 copies/mL). A  negative result must be combined with ?clinical observations, patient history, and epidemiological ?information. The expected result is Negative. ? ?Fact Sheet for Patients:  ?EntrepreneurPulse.com.au ? ?Fact Sheet for Healthcare Providers:  ?IncredibleEmployment.be ? ?This test is no t yet approved or cleared by the [place] FDA and  ?has been authorized for detection and/or diagnosis of SARS-CoV-2 by ?FDA under an Emergency Use Authorization (EUA). This EUA will remain  ?in effect (meaning this test can be used) for the duration of the ?COVID-19 declaration under Section  564(b)(1) of the Act, 21 ?U.S.C.section 360bbb-3(b)(1), unless the authorization is terminated  ?or revoked sooner.  ? ? ?  ? Influenza A by PCR NEGATIVE NEGATIVE Final  ? Influenza B by PCR NEGATIVE NEGATIVE Final  ?  Comment: (N

## 2021-04-27 ENCOUNTER — Encounter: Payer: Self-pay | Admitting: Internal Medicine

## 2021-04-27 ENCOUNTER — Other Ambulatory Visit: Payer: Self-pay | Admitting: Internal Medicine

## 2021-04-27 DIAGNOSIS — C719 Malignant neoplasm of brain, unspecified: Secondary | ICD-10-CM

## 2021-04-27 DIAGNOSIS — R531 Weakness: Secondary | ICD-10-CM | POA: Diagnosis not present

## 2021-04-27 DIAGNOSIS — G936 Cerebral edema: Secondary | ICD-10-CM | POA: Diagnosis not present

## 2021-04-27 DIAGNOSIS — Y842 Radiological procedure and radiotherapy as the cause of abnormal reaction of the patient, or of later complication, without mention of misadventure at the time of the procedure: Secondary | ICD-10-CM

## 2021-04-27 DIAGNOSIS — Z87891 Personal history of nicotine dependence: Secondary | ICD-10-CM | POA: Diagnosis not present

## 2021-04-27 DIAGNOSIS — I6789 Other cerebrovascular disease: Secondary | ICD-10-CM | POA: Insufficient documentation

## 2021-04-27 DIAGNOSIS — I10 Essential (primary) hypertension: Secondary | ICD-10-CM | POA: Diagnosis not present

## 2021-04-27 DIAGNOSIS — R569 Unspecified convulsions: Secondary | ICD-10-CM | POA: Diagnosis not present

## 2021-04-27 HISTORY — DX: Other cerebrovascular disease: I67.89

## 2021-04-27 HISTORY — DX: Radiological procedure and radiotherapy as the cause of abnormal reaction of the patient, or of later complication, without mention of misadventure at the time of the procedure: Y84.2

## 2021-04-27 MED ORDER — HYDRALAZINE HCL 25 MG PO TABS: 25.0000 mg | ORAL_TABLET | Freq: Four times a day (QID) | ORAL | Status: DC | PRN

## 2021-04-27 MED ORDER — ADULT MULTIVITAMIN W/MINERALS CH
1.0000 | ORAL_TABLET | Freq: Every day | ORAL | Status: DC
  Administered 2021-04-27: 1 via ORAL
  Filled 2021-04-27 (×2): qty 1

## 2021-04-27 NOTE — Progress Notes (Signed)
[place] ?Neuro-Oncology Progress Note ? ?Patient Care Team: ?Erika Organ., MD as PCP - General (Internal Medicine) ? ?CHIEF COMPLAINTS/PURPOSE OF CONSULTATION:  ?Glioblastoma, Left Side Weakness ? ?INTERVAL HISTORY: ?Erika Mitchell describes modest improvement in left sided weakness today after another day of steroids.  It is clear, however, that she is still weaker and more neglectful from baseline.  No new complaints. ? ?HISTORY OF PRESENTING ILLNESS:  ?Erika Mitchell 66 y.o. female presented with several days history of worsening left sided weakness, increased from prior/baseline.  Upon presentation, she was non ambulatory and with no functional use of the left arm.  She has also been ignoring things on the left side in recent days.  No clear improvement since yesterday despite increasing the decadron; prior to admission was on '4mg'$  daily.  Didn't start the second cycle of Temodar yet. ? ?MEDICAL HISTORY:  ?Past Medical History:  ?Diagnosis Date  ? Cancer [facility])   ? basal cell carcinoma on left arm left eye brow  ? Hypertension   ? Vertigo   ? 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 ? ?SURGICAL HISTORY: ?Past Surgical History:  ?Procedure Laterality Date  ? APPLICATION OF CRANIAL NAVIGATION Right 11/25/2020  ? Procedure: APPLICATION OF CRANIAL NAVIGATION;  Surgeon: Judith Part, MD;  Location: [facility];  Service: Neurosurgery;  Laterality: Right;  ? CHOLECYSTECTOMY  2017  ? CRANIOTOMY Right 11/25/2020  ? Procedure: CRANIOTOMY FOR  TUMOR RESECTION WITH Lucky Rathke;  Surgeon: Judith Part, MD;  Location: [place];  Service: Neurosurgery;  Laterality: Right;  ? laproscopy    ? surgery for endometriosisi  ? OOPHORECTOMY  [id number]  ? rt then left ovary  ? ? ?SOCIAL HISTORY: ?Social History  ? ?Socioeconomic History  ? Marital status: Married  ?  Spouse name: Erika Mitchell  ? Number of children: 1  ? Years of education: 63  ? Highest education level: Not on file  ?Occupational History  ?  Employer:  [employer]  ?Tobacco Use  ? Smoking status: Former  ?  Types: Cigarettes  ?  Quit date: 02/08/1996  ?  Years since quitting: 25.2  ? Smokeless tobacco: Never  ?Vaping Use  ? Vaping Use: Never used  ?Substance and Sexual Activity  ? Alcohol use: Not Currently  ?  Alcohol/week: 2.0 standard drinks  ?  Types: 2 Glasses of wine per week  ? Drug use: No  ? Sexual activity: Not Currently  ?Other Topics Concern  ? Not on file  ?Social History Narrative  ? Patient is married Erika Mitchell).    ? Patient is left-handed.  ? Patient is working full-time.  ? Patient has a college education (Associate's)  ? Patient has one child.  ? Patient drinks 3 Cups of coffee daily.  ? ?Social Determinants of Health  ? ?Financial Resource Strain: Not on file  ?Food Insecurity: Not on file  ?Transportation Needs: Not on file  ?Physical Activity: Not on file  ?Stress: Not on file  ?Social Connections: Not on file  ?Intimate Partner Violence: Not on file  ? ? ?FAMILY HISTORY: ?Family History  ?Problem Relation Age of Onset  ? Lung cancer Mother   ? Aneurysm Father   ? Heart disease Sister   ? Atrial fibrillation Brother   ? Colon cancer Neg Hx   ? ? ?ALLERGIES:  is allergic to epinephrine, azithromycin, motrin [ibuprofen], ciprofloxacin, and sulfa antibiotics. ? ?MEDICATIONS:  ?Current Facility-Administered Medications  ?Medication Dose Route Frequency  Provider Last Rate Last Admin  ? acetaminophen (TYLENOL) tablet 650 mg  650 mg Oral Q6H PRN Mercy Riding, MD      ? Or  ? acetaminophen (TYLENOL) suppository 650 mg  650 mg Rectal Q6H PRN Gonfa, Taye T, MD      ? ALPRAZolam Duanne Moron) tablet 1.125 mg  1.125 mg Oral Daily PRN Gonfa, Taye T, MD      ? dexamethasone (DECADRON) injection 4 mg  4 mg Intravenous Q8H Gonfa, Taye T, MD   4 mg at 04/27/21 1420  ? enoxaparin (LOVENOX) injection 40 mg  40 mg Subcutaneous Q24H Gonfa, Taye T, MD   40 mg at 04/27/21 1497  ? hydrALAZINE (APRESOLINE) tablet 25 mg  25 mg Oral Q6H PRN Wendee Beavers T, MD      ?  HYDROmorphone (DILAUDID) injection 0.5 mg  0.5 mg Intravenous Q3H PRN Wendee Beavers T, MD      ? irbesartan (AVAPRO) tablet 150 mg  150 mg Oral Daily Wendee Beavers T, MD   150 mg at 04/27/21 0842  ? levETIRAcetam (KEPPRA) tablet 1,000 mg  1,000 mg Oral BID Wendee Beavers T, MD   1,000 mg at 04/27/21 0841  ? multivitamin with minerals tablet 1 tablet  1 tablet Oral Daily Mercy Riding, MD   1 tablet at 04/27/21 1420  ? ondansetron (ZOFRAN) tablet 4 mg  4 mg Oral Q6H PRN Mercy Riding, MD      ? Or  ? ondansetron (ZOFRAN) injection 4 mg  4 mg Intravenous Q6H PRN Gonfa, Taye T, MD      ? oxyCODONE (Oxy IR/ROXICODONE) immediate release tablet 5 mg  5 mg Oral Q6H PRN Gonfa, Taye T, MD      ? polyethylene glycol (MIRALAX / GLYCOLAX) packet 17 g  17 g Oral BID PRN Gonfa, Taye T, MD      ? senna-docusate (Senokot-S) tablet 1 tablet  1 tablet Oral BID PRN Mercy Riding, MD      ? ? ?REVIEW OF SYSTEMS:   ?Constitutional: Denies fevers, chills or abnormal weight loss ?Eyes: Denies blurriness of vision ?Ears, nose, mouth, throat, and face: Denies mucositis or sore throat ?Respiratory: Denies cough, dyspnea or wheezes ?Cardiovascular: Denies palpitation, chest discomfort or lower extremity swelling ?Gastrointestinal:  Denies nausea, constipation, diarrhea ?GU: Denies dysuria or incontinence ?Skin: Denies abnormal skin rashes ?Neurological: Per HPI ?Musculoskeletal: Denies joint pain, back or neck discomfort. No decrease in ROM ?Behavioral/Psych: Denies anxiety, disturbance in thought content, and mood instability ? ? ?PHYSICAL EXAMINATION: ?Vitals:  ? 04/26/21 2055 04/27/21 0263  ?BP: (!) 143/75 (!) 173/91  ?Pulse: 67 74  ?Resp: 14 14  ?Temp: 98.6 ?F (37 ?C) (!) 97.4 ?F (36.3 ?C)  ?SpO2: 93% 100%  ? ?KPS: 60. ?General: Alert, cooperative, pleasant, in no acute distress ?Head: Craniotomy scar noted, dry and intact. ?EENT: No conjunctival injection or scleral icterus. Oral mucosa moist ?Lungs: Resp effort normal ?Cardiac: Regular rate  and rhythm ?Abdomen: Soft, non-distended abdomen ?Skin: No rashes cyanosis or petechiae. ?Extremities: No clubbing or edema ? ?NEUROLOGIC EXAM: ?Mental Status: Awake, alert, attentive to examiner. Oriented to self and environment. Language is fluent with intact comprehension.  Moderate left sided neglect. ?Cranial Nerves: Visual acuity is grossly normal. Visual fields are full. Extra-ocular movements intact. No ptosis. Face is symmetric, tongue midline. ?Motor: Tone and bulk are normal. Power is 4-/5 in left arm and leg. Reflexes are symmetric, no pathologic reflexes present. Intact finger to nose bilaterally ?Sensory:  Intact to light touch and temperature ?Gait: Non ambulatory ? ? ?LABORATORY DATA:  ?I have reviewed the data as listed ?Lab Results  ?Component Value Date  ? WBC 7.2 04/25/2021  ? HGB 15.2 (H) 04/25/2021  ? HCT 46.1 (H) 04/25/2021  ? MCV 92.9 04/25/2021  ? PLT 176 04/25/2021  ? ?Recent Labs  ?  02/02/21 ?1054 02/15/21 ?0856 04/22/21 ?1149 04/25/21 ?1412  ?NA 141 140 141 139  ?K 3.7 3.8 3.4* 3.9  ?CL 107 109 107 106  ?CO2 '27 24 29 25  '$ ?GLUCOSE 105* 97 77 138*  ?BUN '10 11 20 15  '$ ?CREATININE 0.79 0.87 0.89 0.78  ?CALCIUM 9.6 9.4 9.2 9.8  ?GFRNONAA >60 >60 >60 >60  ?PROT 6.3* 6.3* 6.2*  --   ?ALBUMIN 4.0 4.0 4.0  --   ?AST '17 16 17  '$ --   ?ALT 31 21 41  --   ?ALKPHOS 45 44 33*  --   ?BILITOT 0.5 0.7 0.5  --   ? ? ?RADIOGRAPHIC STUDIES: ?I have personally reviewed the radiological images as listed and agreed with the findings in the report. ?DG Chest 2 View ? ?Result Date: 04/25/2021 ?CLINICAL DATA:  Weakness. EXAM: CHEST - 2 VIEW COMPARISON:  Chest CT 11/23/2020 FINDINGS: Patient is rotated. The heart is normal in size. Normal mediastinal contours for degree of rotation. No pulmonary edema, pleural effusion, pneumothorax, or focal airspace disease. The tiny sclerotic focus within T9 on prior CT is not seen by radiograph. No acute osseous findings. IMPRESSION: No acute chest findings. Electronically Signed    By: Keith Rake M.D.   On: 04/25/2021 15:25  ? ?CT Head Wo Contrast ? ?Result Date: 04/25/2021 ?CLINICAL DATA:  Neuro deficit, acute, stroke suspected EXAM: CT HEAD WITHOUT CONTRAST TECHNIQUE: Cornelius Moras

## 2021-04-27 NOTE — Progress Notes (Signed)
?PROGRESS NOTE ? ?Erika Mitchell ASN:[id number] DOB: 12-04-1955  ? ?PCP: Ginger Organ., MD ? ?Patient is from: Home.  Patient lives with husband. ? ?DOA: 04/25/2021 LOS: 1 ? ?Chief complaints ?Chief Complaint  ?Patient presents with  ? Weakness  ?  ? ?Brief Narrative / Interim history: ?66 year old F with PMH of glioblastoma s/p craniotomy and resection in 11/2020 with residual left hemiparesis on chemotherapy, HTN, vestibular neuritis  and seizure presenting worsening left-sided weakness, left neglect and fatigue.  CT head and MRI brain concerning for mild interval increase vasogenic edema within right frontal lobe resulting slight increase in mass effect with approximately 4 mm of leftward midline shift.  Neurooncology, Dr. Mickeal Skinner consulted.  Patient was started on IV Decadron.  Seems to be improving.  Neurooncology following.  Therapy recommended SNF. ? ? ?Subjective: ?Seen and examined earlier this morning.  No major events overnight of this morning.  No complaints.  Left arm weakness seems to have improved. ? ?Objective: ?Vitals:  ? 04/26/21 1937 04/26/21 1321 04/26/21 2055 04/27/21 9024  ?BP: 129/78 132/83 (!) 143/75 (!) 173/91  ?Pulse: 75 73 67 74  ?Resp: '16 16 14 14  ' ?Temp: 98.6 ?F (37 ?C) 98 ?F (36.7 ?C) 98.6 ?F (37 ?C) (!) 97.4 ?F (36.3 ?C)  ?TempSrc: Oral  Oral Oral  ?SpO2: 95% 97% 93% 100%  ? ? ?Examination: ? ?GENERAL: No apparent distress.  Nontoxic. ?HEENT: MMM.  Vision and hearing grossly intact.  ?NECK: Supple.  No apparent JVD.  ?RESP:  No IWOB.  Fair aeration bilaterally. ?CVS:  RRR. Heart sounds normal.  ?ABD/GI/GU: BS+. Abd soft, NTND.  ?MSK/EXT:  Moves extremities.  Left arm and leg weakness. ?SKIN: no apparent skin lesion or wound ?NEURO: Awake and alert. Oriented x4.  Follows commands.  Still with some left neglect but better.  Motor 4/5 in LUE, 4+/5 in LLE and intact elsewhere.  Left pronator drift improved. ?PSYCH: Calm. Normal affect.  ? ?Procedures:  ?None ? ?Microbiology  summarized: ?COVID-19 and influenza PCR nonreactive. ? ?Assessment and Plan: ?* Acute on chronic left-sided weakness ?Likely due to underlying brain tumor with worsening vasogenic edema and mass effect as noted on CT head and MRI brain.  She has more pronounced weakness in LUE with some degree of left neglect and difficulty performing simple tasks and following instructions although she is oriented x4.  Seems to be improving now. ?-Neuro oncology following. ?-Continue Decadron 4 mg every 8 hours ?-Hold home Temodar per neuro-oncology ?-Continue home Keppra for seizure prophylaxis. ?-Fall precaution ?-PT/OT-recommended SNF. ? ?Glioblastoma, IDH-wildtype ([place]) ?S/p craniotomy with resection in 11/2020.  Followed by neurooncology. ?-See above ? ? ?Seizures ([place]) ?Likely from brain mass. ?-Continue home care after med rec. ? ?Cerebral edema ([facility]) ?Decadron as above. ? ?Essential hypertension ?Seems to be on Micardis at home.  Blood pressure elevated. ?-Avapro in place of home Micardis ?-Add hydralazine as needed ?-Consider increasing Avapro if SBP persistently elevated ? 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 ? ? ? ? ?  ? ?There is no height or weight on file to calculate BMI. ?  ?  ?  ?  ?DVT prophylaxis:  ?enoxaparin (LOVENOX) injection 40 mg Start: 04/26/21 0800 ? ?Code Status: DNR/DNI ?Family Communication: Updated patient's husband at bedside. ?Level of care: Med-Surg ?Status is: Inpatient ? ?The patient remains inpatient because: Due to brain tumor with vasogenic edema and left hemiparesis requiring IV steroid and further evaluation by neurooncology ? ? ?Final disposition: Therapy recommended SNF. ? ?  Consultants:  ?Neuro oncology ? ?Sch Meds:  ?Scheduled Meds: ? dexamethasone (DECADRON) injection  4 mg Intravenous Q8H  ? enoxaparin (LOVENOX) injection  40 mg Subcutaneous Q24H  ? irbesartan  150 mg Oral Daily  ? levETIRAcetam  1,000 mg Oral BID  ? ?Continuous Infusions: ?PRN Meds:.acetaminophen  **OR** acetaminophen, ALPRAZolam, hydrALAZINE, HYDROmorphone (DILAUDID) injection, ondansetron **OR** ondansetron (ZOFRAN) IV, oxyCODONE, polyethylene glycol, senna-docusate ? ?Antimicrobials: ?Anti-infectives (From admission, onward)  ? ? None  ? ?  ? ? ? ?I have personally reviewed the following labs and images: ?CBC: ?Recent Labs  ?Lab 04/22/21 ?1149 04/25/21 ?1412  ?WBC 5.7 7.2  ?NEUTROABS 3.7 6.7  ?HGB 13.6 15.2*  ?HCT 41.8 46.1*  ?MCV 93.3 92.9  ?PLT 188 176  ? ?BMP &GFR ?Recent Labs  ?Lab 04/22/21 ?1149 04/25/21 ?1412  ?NA 141 139  ?K 3.4* 3.9  ?CL 107 106  ?CO2 29 25  ?GLUCOSE 77 138*  ?BUN 20 15  ?CREATININE 0.89 0.78  ?CALCIUM 9.2 9.8  ? ?Estimated Creatinine Clearance: 80.6 mL/min (by C-G formula based on SCr of 0.78 mg/dL). ?Liver & Pancreas: ?Recent Labs  ?Lab 04/22/21 ?1149  ?AST 17  ?ALT 41  ?ALKPHOS 33*  ?BILITOT 0.5  ?PROT 6.2*  ?ALBUMIN 4.0  ? ?No results for input(s): LIPASE, AMYLASE in the last 168 hours. ?No results for input(s): AMMONIA in the last 168 hours. ?Diabetic: ?No results for input(s): HGBA1C in the last 72 hours. ?No results for input(s): GLUCAP in the last 168 hours. ?Cardiac Enzymes: ?No results for input(s): CKTOTAL, CKMB, CKMBINDEX, TROPONINI in the last 168 hours. ?No results for input(s): PROBNP in the last 8760 hours. ?Coagulation Profile: ?No results for input(s): INR, PROTIME in the last 168 hours. ?Thyroid Function Tests: ?No results for input(s): TSH, T4TOTAL, FREET4, T3FREE, THYROIDAB in the last 72 hours. ?Lipid Profile: ?No results for input(s): CHOL, HDL, LDLCALC, TRIG, CHOLHDL, LDLDIRECT in the last 72 hours. ?Anemia Panel: ?No results for input(s): VITAMINB12, FOLATE, FERRITIN, TIBC, IRON, RETICCTPCT in the last 72 hours. ?Urine analysis: ?   ?Component Value Date/Time  ? COLORURINE STRAW (A) 11/23/2020 1652  ? APPEARANCEUR CLEAR 11/23/2020 1652  ? LABSPEC 1.006 11/23/2020 1652  ? PHURINE 5.0 11/23/2020 1652  ? GLUCOSEU NEGATIVE 11/23/2020 1652  ? [place] NEGATIVE  11/23/2020 1652  ? [place] NEGATIVE 11/23/2020 1652  ? [place] NEGATIVE 11/23/2020 1652  ? PROTEINUR NEGATIVE 11/23/2020 1652  ? NITRITE NEGATIVE 11/23/2020 1652  ? LEUKOCYTESUR NEGATIVE 11/23/2020 1652  ? ?Sepsis Labs: ?Invalid input(s): PROCALCITONIN, LACTICIDVEN ? ?Microbiology: ?Recent Results (from the past 240 hour(s))  ?Resp Panel by RT-PCR (Flu A&B, Covid) Nasopharyngeal Swab     Status: None  ? Collection Time: 04/25/21  2:27 PM  ? Specimen: Nasopharyngeal Swab; Nasopharyngeal(NP) swabs in vial transport medium  ?Result Value Ref Range Status  ? SARS Coronavirus 2 by RT PCR NEGATIVE NEGATIVE Final  ?  Comment: (NOTE) ?SARS-CoV-2 target nucleic acids are NOT DETECTED. ? ?The SARS-CoV-2 RNA is generally detectable in upper respiratory ?specimens during the acute phase of infection. The lowest ?concentration of SARS-CoV-2 viral copies this assay can detect is ?138 copies/mL. A negative result does not preclude SARS-Cov-2 ?infection and should not be used as the sole basis for treatment or ?other patient management decisions. A negative result may occur with  ?improper specimen collection/handling, submission of specimen other ?than nasopharyngeal swab, presence of viral mutation(s) within the ?areas targeted by this assay, and inadequate number of viral ?copies(<138 copies/mL). A negative result must be combined  with ?clinical observations, patient history, and epidemiological ?information. The expected result is Negative. ? ?Fact Sheet for Patients:  ?EntrepreneurPulse.com.au ? ?Fact Sheet for Healthcare Providers:  ?IncredibleEmployment.be ? ?This test is no t yet approved or cleared by the [place] FDA and  ?has been authorized for detection and/or diagnosis of SARS-CoV-2 by ?FDA under an Emergency Use Authorization (EUA). This EUA will remain  ?in effect (meaning this test can be used) for the duration of the ?COVID-19 declaration under Section 564(b)(1) of the  Act, 21 ?U.S.C.section 360bbb-3(b)(1), unless the authorization is terminated  ?or revoked sooner.  ? ? ?  ? Influenza A by PCR NEGATIVE NEGATIVE Final  ? Influenza B by PCR NEGATIVE NEGATIVE Final  ?  Comment: (NOT

## 2021-04-27 NOTE — Progress Notes (Signed)
Initial Nutrition Assessment ? ?INTERVENTION:  ? ?-Multivitamin with minerals daily ? ?NUTRITION DIAGNOSIS:  ? ?Increased nutrient needs related to cancer and cancer related treatments as evidenced by estimated needs. ? ?GOAL:  ? ?Patient will meet greater than or equal to 90% of their needs ? ?MONITOR:  ? ?PO intake, Supplement acceptance, Labs, Weight trends, I & O's ? ?REASON FOR ASSESSMENT:  ? ?Malnutrition Screening Tool ?  ? ?ASSESSMENT:  ? ?66 year old F with PMH of glioblastoma s/p craniotomy and resection in 11/2020 with residual left hemiparesis on chemotherapy, HTN, vestibular neuritis  and seizure presenting worsening left-sided weakness and fatigue.  CT head and MRI brain concerning for mild interval increase vasogenic edema within right frontal lobe resulting slight increase in mass effect with approximately 4 mm of leftward midline shift. ? ?Patient in room, husband at bedside. Pt reports she still has an appetite. Denies taste changes, swallowing issues or other nutrition impact symptoms at this time. Pt with left sided weakness but still able to eat independently. States she ate almost all of her pot roast for lunch today with some corn and mashed potatoes.  ?Pt ate 50% of her breakfast of grits, eggs, bacon, english muffin and potatoes.  ? ?At this time doesn't want any protein shakes. States she asked her doctor last week if she should start drinking them and they said no. States she would like to see how her meal intakes go. Will need dairy-free options such as Boost [employer]reeze or [employer] if needed. States dairy messes up her stomach. ? ?Husband reports she weighed 187 lbs last week. Has not been weighed again this week. States her weight has remained stable.  ? ?Medications: Decadron ? ?Labs reviewed. ? ?NUTRITION - FOCUSED PHYSICAL EXAM: ? ?Noted left sided weakness r/t glioblastoma. ? ?Flowsheet Row Most Recent Value  ?Orbital Region No depletion  ?Upper Arm Region No depletion  ?Thoracic and  Lumbar Region No depletion  ?Buccal Region No depletion  ?Temple Region No depletion  ?Clavicle Bone Region No depletion  ?Clavicle and Acromion Bone Region No depletion  ?Scapular Bone Region No depletion  ?Dorsal Hand Moderate depletion  ?Patellar Region Unable to assess  ?Anterior Thigh Region Unable to assess  ?Posterior Calf Region Unable to assess  ?Edema (RD Assessment) None  ?Hair Unable to assess  [no hair]  ?Eyes Reviewed  ?Mouth Reviewed  ?Skin Reviewed  ? ?  ? ? ?Diet Order:   ?Diet Order   ? ?       ?  Diet regular Room service appropriate? Yes; Fluid consistency: Thin  Diet effective now       ?  ? ?  ?  ? ?  ? ? ?EDUCATION NEEDS:  ? ?Education needs have been addressed ? ?Skin:  Skin Assessment: Reviewed RN Assessment ? ?Last BM:  3/21 -type 6 ? ?Height:  ? ?Ht Readings from Last 1 Encounters:  ?03/23/21 '5\' 7"'$  (1.702 m)  ? ? ?Weight:  ? ?Wt Readings from Last 1 Encounters:  ?04/22/21 84.9 kg  ? ? ?BMI: 29.1 kg/m^2 ? ?Estimated Nutritional Needs:  ? ?Kcal:  1850-2050 ? ?Protein:  95-110g ? ?Fluid:  2L/day ? ?Clayton Bibles, MS, RD, LDN ?Inpatient Clinical Dietitian ?Contact information available via Amion ? ?

## 2021-04-27 NOTE — Progress Notes (Signed)
START ON PATHWAY REGIMEN - Neuro ? ? ?  A cycle is every 14 days: ?    Bevacizumab-xxxx  ? ?**Always confirm dose/schedule in your pharmacy ordering system** ? ?Patient Characteristics: ?Glioma, Glioblastoma, IDH-wildtype, Recurrent or Progressive, Nonsurgical Candidate, Systemic Therapy Candidate, BRAF V600E Mutation Negative/Unknown and NTRK Fusion Negative/Unknown ?Disease Classification: Glioma ?Disease Classification: Glioblastoma, IDH-wildtype ?Disease Status: Recurrent or Progressive ?Treatment Classification: Nonsurgical Candidate ?Treatment (Nonsurgical/Adjuvant): Systemic Therapy Candidate ?NTRK Gene Fusion Status: Awaiting Test Results ?BRAF V600E Mutation Status: Awaiting Test Results ?Intent of Therapy: ?Non-Curative / Palliative Intent, Discussed with Patient ?

## 2021-04-27 NOTE — TOC Initial Note (Addendum)
Transition of Care (TOC) - Initial/Assessment Note  ? ? ?Patient Details  ?Name: Erika Mitchell ?MRN: [id number] ?Date of Birth: 1955/06/09 ? ?Transition of Care (TOC) CM/SW Contact:    ?Marvene Strohm, Marjie Skiff, RN ?Phone Number: ?04/27/2021, 3:57 PM ? ?Clinical Narrative:                 ?Spoke with pt and husband at bedside for dc planning. They decline SNF placement and would like HHPT/OT. Choice offered for home health and [facility] chosen. [facility] liaison contacted for referral. Pt states she has RW, wheelchair and 3in1 at home currently. ? ?Expected Discharge Plan: [place] ?Barriers to Discharge: Continued Medical Work up ? ? ?Patient Goals and CMS Choice ?Patient states their goals for this hospitalization and ongoing recovery are:: To go home ?CMS Medicare.gov Compare Post Acute Care list provided to:: Patient ?Choice offered to / list presented to : Patient ? ?Expected Discharge Plan and Services ?Expected Discharge Plan: [place] ?  ?Discharge Planning Services: CM Consult ?Post Acute Care Choice: Home Health ?Living arrangements for the past 2 months: [place] ?                ?   ?HH Arranged: PT, OT ?[place] Agency: [facility] ?Date HH Agency Contacted: 04/27/21 ?Time [place]: 0350 ?Representative spoke with at [place]: Tommi Rumps ? ?Prior Living Arrangements/Services ?Living arrangements for the past 2 months: [place] ?Lives with:: Spouse ?Patient language and need for interpreter reviewed:: Yes ?Do you feel safe going back to the place where you live?: Yes      ?Need for Family Participation in Patient Care: Yes (Comment) ?Care giver support system in place?: Yes (comment) ?  ?Criminal Activity/Legal Involvement Pertinent to Current Situation/Hospitalization: No - Comment as needed ? ?Activities of Daily Living ?Home Assistive Devices/Equipment: Grab bars in shower, Grab bars around toilet, Walker (specify type), Shower chair with  back ?ADL Screening (condition at time of admission) ?Patient's cognitive ability adequate to safely complete daily activities?: Yes ?Is the patient deaf or have difficulty hearing?: No ?Does the patient have difficulty seeing, even when wearing glasses/contacts?: No ?Does the patient have difficulty concentrating, remembering, or making decisions?: Yes ?Patient able to express need for assistance with ADLs?: Yes ?Does the patient have difficulty dressing or bathing?: Yes ?Independently performs ADLs?: No ?Communication: Independent ?Dressing (OT): Needs assistance ?Is this a change from baseline?: Pre-admission baseline ?Grooming: Needs assistance ?Is this a change from baseline?: Pre-admission baseline ?Feeding: Independent ?Bathing: Needs assistance ?Is this a change from baseline?: Pre-admission baseline ?Toileting: Needs assistance ?Is this a change from baseline?: Pre-admission baseline ?In/Out Bed: Needs assistance ?Is this a change from baseline?: Pre-admission baseline ?Walks in Home: Needs assistance ?Is this a change from baseline?: Pre-admission baseline ?Does the patient have difficulty walking or climbing stairs?: Yes ?Weakness of Legs: Left ?Weakness of Arms/Hands: Left ? ?Permission Sought/Granted ?Permission sought to share information with : Customer service manager ?Permission granted to share information with : Yes, Verbal Permission Granted ?   ? Permission granted to share info w AGENCY: [facility] ?   ?   ? ?Emotional Assessment ?Appearance:: Appears stated age ?Attitude/Demeanor/Rapport: Gracious ?Affect (typically observed): Calm ?Orientation: : Oriented to Self, Oriented to Place, Oriented to  Time, Oriented to Situation ?Alcohol / Substance Use: Not Applicable ?Psych Involvement: No (comment) ? ?Admission diagnosis:  Generalized weakness [R53.1] ?Acute left-sided weakness [R53.1] ?Patient Active Problem List  ?  Diagnosis Date Noted  ? 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 Acute on chronic left-sided weakness 04/25/2021  ? Essential hypertension 12/05/2020  ? Seizures ([place]) 12/05/2020  ? S/P craniotomy 12/05/2020  ? Cerebral edema ([place]) 12/04/2020  ? Glioblastoma, IDH-wildtype ([place]) 11/25/2020  ? Brain mass 11/23/2020  ? Hypokalemia 11/23/2020  ? Benign paroxysmal positional vertigo 05/22/2013  ? Vestibular neuronitis of left ear 05/22/2013  ? ?PCP:  Ginger Organ., MD ?Pharmacy:   ?[facility] [place] [place] [address] ?[place] ?[place] [address] ?Phone: [phone] Fax: [phone] ? ? ? ? ?Social Determinants of Health (SDOH) Interventions ?  ? ?Readmission Risk Interventions ?Readmission Risk Prevention Plan 04/27/2021  ?Transportation Screening Complete  ?PCP or Specialist Appt within 5-7 Days Complete  ?Home Care Screening Complete  ?Medication Review (RN CM) Complete  ?Some recent data might be hidden  ? ? ? ?

## 2021-04-28 DIAGNOSIS — I10 Essential (primary) hypertension: Secondary | ICD-10-CM | POA: Diagnosis not present

## 2021-04-28 DIAGNOSIS — R531 Weakness: Secondary | ICD-10-CM | POA: Diagnosis not present

## 2021-04-28 DIAGNOSIS — C719 Malignant neoplasm of brain, unspecified: Secondary | ICD-10-CM | POA: Diagnosis not present

## 2021-04-28 DIAGNOSIS — G936 Cerebral edema: Secondary | ICD-10-CM | POA: Diagnosis not present

## 2021-04-28 MED ORDER — LOPERAMIDE HCL 2 MG PO CAPS: 2.0000 mg | ORAL_CAPSULE | Freq: Three times a day (TID) | ORAL | 0 refills | Status: AC | PRN

## 2021-04-28 MED ORDER — DEXAMETHASONE 4 MG PO TABS: 4.0000 mg | ORAL_TABLET | Freq: Three times a day (TID) | ORAL | Status: DC

## 2021-04-28 NOTE — Discharge Summary (Signed)
Physician Discharge Summary  ?Erika Mitchell XIP:[id number] DOB: 04-23-1955 DOA: 04/25/2021 ? ?PCP: Ginger Organ., MD ? ?Admit date: 04/25/2021 ?Discharge date: 04/28/2021 ? ?Admitted From: Home ?Disposition: Home ? ?Recommendations for Outpatient Follow-up:  ?Follow up with PCP in 1 week  ?Outpatient follow-up with neuro oncology/Dr. Mickeal Skinner ?Follow up in ED if symptoms worsen or new appear ? ? ?Home Health: Home health PT/OT  ?equipment/Devices: None ? ?Discharge Condition: Guarded  ?CODE STATUS: DNR  ?diet recommendation: Regular ? ?Brief/Interim Summary: ?66 year old F with PMH of glioblastoma s/p craniotomy and resection in 11/2020 with residual left hemiparesis on chemotherapy, HTN, vestibular neuritis  and seizure presented with worsening left-sided weakness, left neglect and fatigue.  CT head and MRI brain was concerning for mild interval increase vasogenic edema within right frontal lobe resulting slight increase in mass effect with approximately 4 mm of leftward midline shift.  Neurooncology, Dr. Mickeal Skinner consulted.  Patient was started on IV Decadron.  Symptoms are slightly improving.  PT recommended SNF.  Patient/family denies SNF.  Neuro oncology has cleared the patient for discharge on oral Decadron with outpatient follow-up with Dr. Mickeal Skinner.  She will be discharged home today with home health PT/OT. ? ?Discharge Diagnoses:  ? ?Acute on chronic left-sided weakness ?History of glioblastoma status postcraniotomy and resection ?Cerebral edema ?-Likely due to underlying brain tumor with worsening vasogenic edema and mass effect as noted on CT head and MRI brain.  ?-Treated with IV Decadron as per neuro oncology/Dr. Mickeal Skinner.  Symptoms are only slightly improved.  PT recommended SNF placement.  Patient/family denies SNF. ?- Neuro oncology has cleared the patient for discharge on oral Decadron 4 mg 3 times a day with outpatient follow-up with Dr. Mickeal Skinner.  She will be discharged home today with home health  PT/OT. ?-Home Temodar will remain on hold. ? ?Seizures ?-Currently seizure-free.  Continue Keppra. ? ?Essential hypertension ?-Continue home regimen ? ?Vestibular neuronitis of left ear ?-Continue as needed Xanax ? ?Discharge Instructions ? ?Discharge Instructions   ? ? Diet general   Complete by: As directed ?  ? Increase activity slowly   Complete by: As directed ?  ? ?  ? ?Allergies as of 04/28/2021   ? ?   Reactions  ? Epinephrine Other (See Comments)  ? "dizziness, rapid heartbeat"  ? Azithromycin   ? dizziness  ? Motrin [ibuprofen]   ? dizziness  ? Ciprofloxacin   ? Other reaction(s): vertigo  ? Sulfa Antibiotics   ? Other reaction(s): rash  ? ?  ? ?  ?Medication List  ?  ? ?STOP taking these medications   ? ?diazepam 5 MG tablet ?Commonly known as: VALIUM ?  ?temozolomide 100 MG capsule ?Commonly known as: TEMODAR ?  ? ?  ? ?TAKE these medications   ? ?acetaminophen 500 MG tablet ?Commonly known as: TYLENOL ?Take 500 mg by mouth every 6 (six) hours as needed for moderate pain. ?  ?ALPRAZolam 0.25 MG tablet ?Commonly known as: Duanne Moron ?Take 4.5 tablets (1.125 mg total) by mouth daily as needed (Vestibular neuritis). Patient states she takes 4 of the 0.25 mg tablets and then splits one 0.25 mg tablet to make total of 1.125 mg daily for vestibular neuritis ?  ?dexamethasone 4 MG tablet ?Commonly known as: DECADRON ?Take 1 tablet (4 mg total) by mouth 3 (three) times daily. ?What changed:  ?when to take this ?Another medication with the same name was removed. Continue taking this medication, and follow the directions you see here. ?  ?  levETIRAcetam 1000 MG tablet ?Commonly known as: Keppra ?Take 1 tablet (1,000 mg total) by mouth 2 (two) times daily. ?  ?loperamide 2 MG capsule ?Commonly known as: IMODIUM ?Take 1 capsule (2 mg total) by mouth 3 (three) times daily as needed for diarrhea or loose stools. ?  ?ondansetron 8 MG tablet ?Commonly known as: Zofran ?Take 1 tablet (8 mg total) by mouth 2 (two) times daily as  needed (nausea and vomiting). May take 30-60 minutes prior to Temodar administration if nausea/vomiting occurs. ?What changed: reasons to take this ?  ?telmisartan 40 MG tablet ?Commonly known as: MICARDIS ?Take 40 mg by mouth every evening. ?  ? ?  ? ?  ?  ? ? ?  ?Durable Medical Equipment  ?(From admission, onward)  ?  ? ? ?  ? ?  Start     Ordered  ? 04/27/21 1604  For home use only DME standard manual wheelchair with seat cushion  Once       ?Comments: Patient suffers from brain tumor with left upper extremity weakness and which impairs their ability to perform daily activities like bathing, dressing, feeding, grooming, and toileting in the home.  A cane, crutch, or walker will not resolve issue with performing activities of daily living. A wheelchair will allow patient to safely perform daily activities. Patient can safely propel the wheelchair in the home or has a caregiver who can provide assistance. Length of need Lifetime. ?Accessories: elevating leg rests (ELRs), wheel locks, extensions and anti-tippers.  ? 04/27/21 1604  ? ?  ?  ? ?  ? ? Follow-up Information   ? ? Care, [facility] Follow up.   ?Specialty: Home Health Services ?Contact information: ?[address] ?[address] ?[place] [address] ?[phone] ? ? ?  ?  ? ? Ginger Organ., MD. Schedule an appointment as soon as possible for a visit in 1 week(s).   ?Specialty: Internal Medicine ?Contact information: ?[address] ?[place] [address] ?[phone] ? ? ?  ?  ? ? Ventura Sellers, MD. Schedule an appointment as soon as possible for a visit in 1 week(s).   ?Specialties: Psychiatry, Neurology, Oncology ?Contact information: ?[address] ?[place] [address] ?[phone] ? ? ?  ?  ? ?  ?  ? ?  ? ?Allergies  ?Allergen Reactions  ? Epinephrine Other (See Comments)  ?  "dizziness, rapid heartbeat"  ? Azithromycin   ?  dizziness  ? Motrin [Ibuprofen]   ?  dizziness  ? Ciprofloxacin   ?  Other reaction(s): vertigo  ?  Sulfa Antibiotics   ?  Other reaction(s): rash  ? ? ?Consultations: ?Neuro oncology ? ? ?Procedures/Studies: ?DG Chest 2 View ? ?Result Date: 04/25/2021 ?CLINICAL DATA:  Weakness. EXAM: CHEST - 2 VIEW COMPARISON:  Chest CT 11/23/2020 FINDINGS: Patient is rotated. The heart is normal in size. Normal mediastinal contours for degree of rotation. No pulmonary edema, pleural effusion, pneumothorax, or focal airspace disease. The tiny sclerotic focus within T9 on prior CT is not seen by radiograph. No acute osseous findings. IMPRESSION: No acute chest findings. Electronically Signed   By: Keith Rake M.D.   On: 04/25/2021 15:25  ? ?CT Head Wo Contrast ? ?Result Date: 04/25/2021 ?CLINICAL DATA:  Neuro deficit, acute, stroke suspected EXAM: CT HEAD WITHOUT CONTRAST TECHNIQUE: Contiguous axial images were obtained from the base of the skull through the vertex without intravenous contrast. RADIATION DOSE REDUCTION: This exam was performed according to the  departmental dose-optimization program which includes automated exposure control, adjustment of the mA and/or kV according to patient size and/or use of iterative reconstruction technique. COMPARISON:  MRI March 12, 2021. FINDINGS: Brain: Extensive vasogenic edema in the right frontal lobe which appears similar versus mildly progressed, although comparison across modalities is difficult. As seen on the prior MRI, some edema tracks along the corpus callosum involves the left frontal lobe. Mass effect also appears similar versus mildly progressed with slight leftward midline shift. No evidence of acute superimposed large vascular territory infarct, acute hemorrhage, or hydrocephalus. Vascular: No hyperdense vessel identified. Skull: Prior right frontal craniotomy.  No acute fracture. Sinuses/Orbits: Clear sinuses. Other: No mastoid effusions. IMPRESSION: 1. In comparison to MRI from March 12, 2021 similar versus mildly progressed extensive vasogenic edema in the right  frontal lobe, corpus callosum and to lesser extent left frontal lobe. Known areas of enhancement are not well evaluated by noncontrast head CT. Comparison across modalities is limited and a repeat MRI with con

## 2021-05-03 ENCOUNTER — Inpatient Hospital Stay: Payer: BC Managed Care – PPO

## 2021-05-03 ENCOUNTER — Other Ambulatory Visit: Payer: Self-pay

## 2021-05-03 ENCOUNTER — Inpatient Hospital Stay (HOSPITAL_BASED_OUTPATIENT_CLINIC_OR_DEPARTMENT_OTHER): Payer: BC Managed Care – PPO | Admitting: Internal Medicine

## 2021-05-03 VITALS — BP 142/79 | HR 61 | Temp 97.7°F | Resp 16 | Wt 180.9 lb

## 2021-05-03 DIAGNOSIS — R531 Weakness: Secondary | ICD-10-CM | POA: Diagnosis not present

## 2021-05-03 DIAGNOSIS — C711 Malignant neoplasm of frontal lobe: Secondary | ICD-10-CM | POA: Diagnosis not present

## 2021-05-03 DIAGNOSIS — C719 Malignant neoplasm of brain, unspecified: Secondary | ICD-10-CM | POA: Diagnosis not present

## 2021-05-03 DIAGNOSIS — R569 Unspecified convulsions: Secondary | ICD-10-CM

## 2021-05-03 DIAGNOSIS — Z79899 Other long term (current) drug therapy: Secondary | ICD-10-CM | POA: Diagnosis not present

## 2021-05-03 DIAGNOSIS — I6789 Other cerebrovascular disease: Secondary | ICD-10-CM

## 2021-05-03 DIAGNOSIS — Y842 Radiological procedure and radiotherapy as the cause of abnormal reaction of the patient, or of later complication, without mention of misadventure at the time of the procedure: Secondary | ICD-10-CM

## 2021-05-03 DIAGNOSIS — Z5112 Encounter for antineoplastic immunotherapy: Secondary | ICD-10-CM | POA: Diagnosis not present

## 2021-05-03 LAB — CBC WITH DIFFERENTIAL (CANCER CENTER ONLY)
Abs Immature Granulocytes: 0.55 10*3/uL — ABNORMAL HIGH (ref 0.00–0.07)
Basophils Absolute: 0 10*3/uL (ref 0.0–0.1)
Basophils Relative: 0 %
Eosinophils Absolute: 0 10*3/uL (ref 0.0–0.5)
Eosinophils Relative: 0 %
HCT: 42.8 % (ref 36.0–46.0)
Hemoglobin: 14.9 g/dL (ref 12.0–15.0)
Immature Granulocytes: 5 %
Lymphocytes Relative: 8 %
Lymphs Abs: 1 10*3/uL (ref 0.7–4.0)
MCH: 30.5 pg (ref 26.0–34.0)
MCHC: 34.8 g/dL (ref 30.0–36.0)
MCV: 87.7 fL (ref 80.0–100.0)
Monocytes Absolute: 0.6 10*3/uL (ref 0.1–1.0)
Monocytes Relative: 5 %
Neutro Abs: 10.1 10*3/uL — ABNORMAL HIGH (ref 1.7–7.7)
Neutrophils Relative %: 82 %
Platelet Count: 170 10*3/uL (ref 150–400)
RBC: 4.88 MIL/uL (ref 3.87–5.11)
RDW: 11.9 % (ref 11.5–15.5)
WBC Count: 12.2 10*3/uL — ABNORMAL HIGH (ref 4.0–10.5)
nRBC: 0 % (ref 0.0–0.2)

## 2021-05-03 LAB — CMP (CANCER CENTER ONLY)
ALT: 56 U/L — ABNORMAL HIGH (ref 0–44)
AST: 17 U/L (ref 15–41)
Albumin: 3.9 g/dL (ref 3.5–5.0)
Alkaline Phosphatase: 38 U/L (ref 38–126)
Anion gap: 8 (ref 5–15)
BUN: 17 mg/dL (ref 8–23)
CO2: 23 mmol/L (ref 22–32)
Calcium: 9.1 mg/dL (ref 8.9–10.3)
Chloride: 107 mmol/L (ref 98–111)
Creatinine: 0.72 mg/dL (ref 0.44–1.00)
GFR, Estimated: >60 mL/min (ref >60)
Glucose, Bld: 95 mg/dL (ref 70–99)
Potassium: 4.2 mmol/L (ref 3.5–5.1)
Sodium: 138 mmol/L (ref 135–145)
Total Bilirubin: 0.5 mg/dL (ref 0.3–1.2)
Total Protein: 6.2 g/dL — ABNORMAL LOW (ref 6.5–8.1)

## 2021-05-03 MED ORDER — SODIUM CHLORIDE 0.9 % IV SOLN
10.0000 mg/kg | Freq: Once | INTRAVENOUS | Status: AC
  Administered 2021-05-03: 800 mg via INTRAVENOUS
  Filled 2021-05-03: qty 32

## 2021-05-03 MED ORDER — SODIUM CHLORIDE 0.9 % IV SOLN: Freq: Once | INTRAVENOUS | Status: AC

## 2021-05-03 NOTE — Progress Notes (Signed)
Per Dr Mickeal Skinner ok to proceed with 1st time  Avastin treatment today 05/03/2021 with labs/vitals signs. ? ?Patient was unable to provide urine specimen prior to being arrived to infusion room.  Ok to proceed without urine specimen.  ?

## 2021-05-03 NOTE — Progress Notes (Signed)
? ?[facility] at [address]  ?[place], [place] [address] ?[phone] ? ? ?Interval Evaluation ? ?Date of Service: 05/03/21 ?Patient Name: Erika Mitchell ?Patient MRN: [id number] ?Patient DOB: Dec 09, 1955 ?Provider: Ventura Sellers, MD ? ?Identifying Statement:  ?Erika Mitchell is a 66 y.o. female with right frontal glioblastoma  ? ?Oncologic History: ?Oncology History  ?Glioblastoma, IDH-wildtype ([place])  ?11/25/2020 Surgery  ? Craniotomy, resection with Dr. Zada Finders; path is c/w GBM, IDHwt ?  ?12/04/2020 - 12/07/2020 Hospital Admission  ? Admitted for worsening left sided weakness, MRI demonstrates likely post-surgical localized inflammatory process.  Improves with corticosteroids. ?  ?01/06/2021 - 02/18/2021 Radiation Therapy  ? IMRT and concurrent Temodar Isidore Moos) ?  ?03/25/2021 -  Chemotherapy  ? Patient is on Treatment Plan : BRAIN GLIOBLASTOMA Consolidation Temozolomide Days 1-5 q28 Days   ?   ?05/04/2021 -  Chemotherapy  ? Patient is on Treatment Plan : BRAIN GBM Bevacizumab 14d x 6 cycles  ?   ? ? ?Biomarkers: ? ?MGMT Unknown.  ?IDH 1/2 Wild type.  ?EGFR Unknown  ?TERT Unknown  ? ?Interval History: ?Erika Mitchell presents today for initial avastin infusion after recent inpatient admission for worsening left sided weakness.  She does feel improved compared to last week, but cognition and overall strength is still not at prior baseline (prior to admission).  She is currently ambulating on her own, improved from needing walker.  Energy level is still low, memory is not sharp.  Decadron currently at 61m daily. ? ?Decadron ?03/18/21: 846m?03/25/10: 78m72m ?H+P (12/17/20) Patient presented on 11/23/20 with several episodes of sudden dysfunction of left arm and leg, lasting several minutes, with most recent episode accompanied by frank speech arrest while at work.  First episode was less than one week ago.  Between, she has been normal, at baseline.  CNS imaging demonstrated  enhancing mass in the right frontal lobe.  She underwent craniotomy, resection with Dr. OstZada Finders 11/25/20, path demonstrated gliolblastoma.  On 10/28, she returned to the hospital with several days of progressive left sided weakness.  After improvement back to baseline with steroids, she was discharged to home.  She has no new complaints currently, is scheduled for repeat MRI brain next week prior to radiation therapy.   ? ?Medications: ?Current Outpatient Medications on File Prior to Visit  ?Medication Sig Dispense Refill  ? dexamethasone (DECADRON) 4 MG tablet Take 1 tablet (4 mg total) by mouth 3 (three) times daily.    ? levETIRAcetam (KEPPRA) 1000 MG tablet Take 1 tablet (1,000 mg total) by mouth 2 (two) times daily. 60 tablet 3  ? telmisartan (MICARDIS) 40 MG tablet Take 40 mg by mouth every evening.    ? acetaminophen (TYLENOL) 500 MG tablet Take 500 mg by mouth every 6 (six) hours as needed for moderate pain. (Patient not taking: Reported on 05/03/2021)    ? ALPRAZolam (XANAX) 0.25 MG tablet Take 4.5 tablets (1.125 mg total) by mouth daily as needed (Vestibular neuritis). Patient states she takes 4 of the 0.25 mg tablets and then splits one 0.25 mg tablet to make total of 1.125 mg daily for vestibular neuritis (Patient not taking: Reported on 04/25/2021)    ? loperamide (IMODIUM) 2 MG capsule Take 1 capsule (2 mg total) by mouth 3 (three) times daily as needed for diarrhea or loose stools. (Patient not taking: Reported on 05/03/2021) 30 capsule 0  ? ondansetron (ZOFRAN) 8 MG tablet Take 1 tablet (8 mg  total) by mouth 2 (two) times daily as needed (nausea and vomiting). May take 30-60 minutes prior to Temodar administration if nausea/vomiting occurs. (Patient not taking: Reported on 05/03/2021) 30 tablet 1  ? ?No current facility-administered medications on file prior to visit.  ? ? ?Allergies:  ?Allergies  ?Allergen Reactions  ? Epinephrine Other (See Comments)  ?  "dizziness, rapid heartbeat"  ?  Azithromycin   ?  dizziness  ? Motrin [Ibuprofen]   ?  dizziness  ? Ciprofloxacin   ?  Other reaction(s): vertigo  ? Sulfa Antibiotics   ?  Other reaction(s): rash  ? ?Past Medical History:  ?Past Medical History:  ?Diagnosis Date  ? Cancer [facility])   ? basal cell carcinoma on left arm left eye brow  ? Hypertension   ? 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 Vertigo   ? 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 ?Past Surgical History:  ?Past Surgical History:  ?Procedure Laterality Date  ? APPLICATION OF CRANIAL NAVIGATION Right 11/25/2020  ? Procedure: APPLICATION OF CRANIAL NAVIGATION;  Surgeon: Judith Part, MD;  Location: [facility];  Service: Neurosurgery;  Laterality: Right;  ? CHOLECYSTECTOMY  2017  ? CRANIOTOMY Right 11/25/2020  ? Procedure: CRANIOTOMY FOR  TUMOR RESECTION WITH Lucky Rathke;  Surgeon: Judith Part, MD;  Location: [facility];  Service: Neurosurgery;  Laterality: Right;  ? laproscopy    ? surgery for endometriosisi  ? OOPHORECTOMY  [id number]  ? rt then left ovary  ? ?Social History:  ?Social History  ? ?Socioeconomic History  ? Marital status: Married  ?  Spouse name: [name]  ? Number of children: 1  ? Years of education: 55  ? Highest education level: Not on file  ?Occupational History  ?  Employer: [employer]  ?Tobacco Use  ? Smoking status: Former  ?  Types: Cigarettes  ?  Quit date: 02/08/1996  ?  Years since quitting: 25.2  ? Smokeless tobacco: Never  ?Vaping Use  ? Vaping Use: Never used  ?Substance and Sexual Activity  ? Alcohol use: Not Currently  ?  Alcohol/week: 2.0 standard drinks  ?  Types: 2 Glasses of wine per week  ? Drug use: No  ? Sexual activity: Not Currently  ?Other Topics Concern  ? Not on file  ?Social History Narrative  ? Patient is married [name]).    ? Patient is left-handed.  ? Patient is working full-time.  ? Patient has a college education (Associate's)  ? Patient has one child.  ? Patient drinks 3 Cups of coffee daily.  ? ?Social Determinants of  Health  ? ?Financial Resource Strain: Not on file  ?Food Insecurity: Not on file  ?Transportation Needs: Not on file  ?Physical Activity: Not on file  ?Stress: Not on file  ?Social Connections: Not on file  ?Intimate Partner Violence: Not on file  ? ?Family History:  ?Family History  ?Problem Relation Age of Onset  ? Lung cancer Mother   ? Aneurysm Father   ? Heart disease Sister   ? Atrial fibrillation Brother   ? Colon cancer Neg Hx   ? ? ?Review of Systems: ?Constitutional: Doesn't report fevers, chills or abnormal weight loss ?Eyes: Doesn't report blurriness of vision ?Ears, nose, mouth, throat, and face: Doesn't report sore throat ?Respiratory: Doesn't report cough, dyspnea or wheezes ?Cardiovascular: Doesn't report palpitation, chest discomfort  ?Gastrointestinal:  Doesn't report nausea, constipation, diarrhea ?GU: Doesn't report incontinence ?Skin: Doesn't report skin rashes ?Neurological: Per HPI ?  Musculoskeletal: Doesn't report joint pain ?Behavioral/Psych: Doesn't report anxiety ? ?Physical Exam: ?Vitals:  ? 05/03/21 1145  ?BP: (!) 142/79  ?Pulse: 61  ?Resp: 16  ?Temp: 97.7 ?F (36.5 ?C)  ?SpO2: 95%  ? ?KPS: 70. ?General: Alert, cooperative, pleasant, in no acute distress ?Head: Normal ?EENT: No conjunctival injection or scleral icterus.  ?Lungs: Resp effort normal ?Cardiac: Regular rate ?Abdomen: Non-distended abdomen ?Skin: No rashes cyanosis or petechiae. ?Extremities: No clubbing or edema ? ?Neurologic Exam: ?Mental Status: Awake, alert, attentive to examiner. Oriented to self and environment. Language is fluent with intact comprehension.  ?Cranial Nerves: Visual acuity is grossly normal. Visual fields are full. Extra-ocular movements intact. No ptosis. Face is symmetric ?Motor: Tone and bulk are normal. 4-/5 in left arm and handm, 4+/5 in left leg. Reflexes are symmetric, no pathologic reflexes present.  ?Sensory: Intact to light touch ?Gait: Hemiparetic ? ? ?Labs: ?I have reviewed the data as listed ?    ?Component Value Date/Time  ? NA 138 05/03/2021 1128  ? K 4.2 05/03/2021 1128  ? CL 107 05/03/2021 1128  ? CO2 23 05/03/2021 1128  ? GLUCOSE 95 05/03/2021 1128  ? BUN 17 05/03/2021 1128  ? CREATININE 0.72 03/27/2

## 2021-05-03 NOTE — Patient Instructions (Signed)
[facility] MEDICAL [facility]COLOGY  ° Discharge Instructions: °Thank you for choosing [facility] to provide your oncology and hematology care.  ° °If you have a lab appointment with the Cancer Center, please go directly to the Cancer Center and check in at the registration area. °  °Wear comfortable clothing and clothing appropriate for easy access to any Portacath or PICC line.  ° °We strive to give you quality time with your provider. You may need to reschedule your appointment if you arrive late (15 or more minutes).  Arriving late affects you and other patients whose appointments are after yours.  Also, if you miss three or more appointments without notifying the office, you may be dismissed from the clinic at the provider’s discretion.    °  °For prescription refill requests, have your pharmacy contact our office and allow 72 hours for refills to be completed.   ° °Today you received the following chemotherapy and/or immunotherapy agents: Bevacizumab (Avastin)    °  °To help prevent nausea and vomiting after your treatment, we encourage you to take your nausea medication as directed. ° °BELOW ARE SYMPTOMS THAT SHOULD BE REPORTED IMMEDIATELY: °*FEVER GREATER THAN 100.4 F (38 °C) OR HIGHER °*CHILLS OR SWEATING °*NAUSEA AND VOMITING THAT IS NOT CONTROLLED WITH YOUR NAUSEA MEDICATION °*UNUSUAL SHORTNESS OF BREATH °*UNUSUAL BRUISING OR BLEEDING °*URINARY PROBLEMS (pain or burning when urinating, or frequent urination) °*BOWEL PROBLEMS (unusual diarrhea, constipation, pain near the anus) °TENDERNESS IN MOUTH AND THROAT WITH OR WITHOUT PRESENCE OF ULCERS (sore throat, sores in mouth, or a toothache) °UNUSUAL RASH, SWELLING OR PAIN  °UNUSUAL VAGINAL DISCHARGE OR ITCHING  ° °Items with * indicate a potential emergency and should be followed up as soon as possible or go to the Emergency Department if any problems should occur. ° °Please show the CHEMOTHERAPY ALERT CARD or IMMUNOTHERAPY ALERT CARD  at check-in to the Emergency Department and triage nurse. ° °Should you have questions after your visit or need to cancel or reschedule your appointment, please contact [facility]COL[facility]  Dept: [phone]  and follow the prompts.  Office hours are 8:00 a.m. to 4:30 p.m. Monday - Friday. Please note that voicemails left after 4:00 p.m. may not be returned until the following business day.  We are closed weekends and major holidays. You have access to a nurse at all times for urgent questions. Please call the main number to the clinic Dept: [phone] and follow the prompts. ° ° °For any non-urgent questions, you may also contact your provider using MyChart. We now offer e-Visits for anyone 18 and older to request care online for non-urgent symptoms. For details visit [email or web address]. °  °Also download the MyChart app! Go to the app store, search "MyChart", open the app, select [facility], and log in with your MyChart username and password. ° °Due to Covid, a mask is required upon entering the hospital/clinic. If you do not have a mask, one will be given to you upon arrival. For doctor visits, patients may have 1 support person aged 18 or older with them. For treatment visits, patients cannot have anyone with them due to current Covid guidelines and our immunocompromised population.  ° °

## 2021-05-04 ENCOUNTER — Telehealth: Payer: Self-pay | Admitting: *Deleted

## 2021-05-11 ENCOUNTER — Other Ambulatory Visit: Payer: Self-pay

## 2021-05-11 ENCOUNTER — Other Ambulatory Visit (HOSPITAL_COMMUNITY): Payer: Self-pay

## 2021-05-18 ENCOUNTER — Other Ambulatory Visit (HOSPITAL_COMMUNITY): Payer: Self-pay

## 2021-05-20 ENCOUNTER — Inpatient Hospital Stay: Payer: BC Managed Care – PPO

## 2021-05-20 ENCOUNTER — Other Ambulatory Visit: Payer: Self-pay

## 2021-05-20 ENCOUNTER — Inpatient Hospital Stay: Payer: BC Managed Care – PPO | Attending: Radiation Oncology

## 2021-05-20 ENCOUNTER — Inpatient Hospital Stay (HOSPITAL_BASED_OUTPATIENT_CLINIC_OR_DEPARTMENT_OTHER): Payer: BC Managed Care – PPO | Admitting: Internal Medicine

## 2021-05-20 VITALS — BP 149/83 | HR 67 | Temp 97.7°F | Resp 18 | Wt 185.7 lb

## 2021-05-20 DIAGNOSIS — Y842 Radiological procedure and radiotherapy as the cause of abnormal reaction of the patient, or of later complication, without mention of misadventure at the time of the procedure: Secondary | ICD-10-CM

## 2021-05-20 DIAGNOSIS — C719 Malignant neoplasm of brain, unspecified: Secondary | ICD-10-CM

## 2021-05-20 DIAGNOSIS — Z5112 Encounter for antineoplastic immunotherapy: Secondary | ICD-10-CM | POA: Insufficient documentation

## 2021-05-20 DIAGNOSIS — R569 Unspecified convulsions: Secondary | ICD-10-CM

## 2021-05-20 DIAGNOSIS — Z79899 Other long term (current) drug therapy: Secondary | ICD-10-CM | POA: Insufficient documentation

## 2021-05-20 DIAGNOSIS — C711 Malignant neoplasm of frontal lobe: Secondary | ICD-10-CM | POA: Insufficient documentation

## 2021-05-20 LAB — CBC WITH DIFFERENTIAL (CANCER CENTER ONLY)
Abs Immature Granulocytes: 0.12 10*3/uL — ABNORMAL HIGH (ref 0.00–0.07)
Basophils Absolute: 0 10*3/uL (ref 0.0–0.1)
Basophils Relative: 0 %
Eosinophils Absolute: 0 10*3/uL (ref 0.0–0.5)
Eosinophils Relative: 0 %
HCT: 42.6 % (ref 36.0–46.0)
Hemoglobin: 14.7 g/dL (ref 12.0–15.0)
Immature Granulocytes: 2 %
Lymphocytes Relative: 9 %
Lymphs Abs: 0.7 10*3/uL (ref 0.7–4.0)
MCH: 30.4 pg (ref 26.0–34.0)
MCHC: 34.5 g/dL (ref 30.0–36.0)
MCV: 88 fL (ref 80.0–100.0)
Monocytes Absolute: 0.2 10*3/uL (ref 0.1–1.0)
Monocytes Relative: 3 %
Neutro Abs: 6.6 10*3/uL (ref 1.7–7.7)
Neutrophils Relative %: 86 %
Platelet Count: 162 10*3/uL (ref 150–400)
RBC: 4.84 MIL/uL (ref 3.87–5.11)
RDW: 12.8 % (ref 11.5–15.5)
WBC Count: 7.6 10*3/uL (ref 4.0–10.5)
nRBC: 0 % (ref 0.0–0.2)

## 2021-05-20 LAB — TOTAL PROTEIN, URINE DIPSTICK: Protein, ur: NEGATIVE mg/dL

## 2021-05-20 LAB — CMP (CANCER CENTER ONLY)
ALT: 35 U/L (ref 0–44)
AST: 15 U/L (ref 15–41)
Albumin: 4.1 g/dL (ref 3.5–5.0)
Alkaline Phosphatase: 46 U/L (ref 38–126)
Anion gap: 8 (ref 5–15)
BUN: 14 mg/dL (ref 8–23)
CO2: 24 mmol/L (ref 22–32)
Calcium: 9.4 mg/dL (ref 8.9–10.3)
Chloride: 108 mmol/L (ref 98–111)
Creatinine: 0.67 mg/dL (ref 0.44–1.00)
GFR, Estimated: >60 mL/min (ref >60)
Glucose, Bld: 110 mg/dL — ABNORMAL HIGH (ref 70–99)
Potassium: 4.2 mmol/L (ref 3.5–5.1)
Sodium: 140 mmol/L (ref 135–145)
Total Bilirubin: 0.5 mg/dL (ref 0.3–1.2)
Total Protein: 6.7 g/dL (ref 6.5–8.1)

## 2021-05-20 MED ORDER — SODIUM CHLORIDE 0.9 % IV SOLN: Freq: Once | INTRAVENOUS | Status: AC

## 2021-05-20 MED ORDER — SODIUM CHLORIDE 0.9 % IV SOLN
10.0000 mg/kg | Freq: Once | INTRAVENOUS | Status: AC
  Administered 2021-05-20: 800 mg via INTRAVENOUS
  Filled 2021-05-20: qty 32

## 2021-05-20 MED ORDER — DEXAMETHASONE 1 MG PO TABS: ORAL_TABLET | ORAL | 0 refills | Status: AC

## 2021-05-20 NOTE — Patient Instructions (Signed)
[facility]COL[facility]  ° Discharge Instructions: °Thank you for choosing [facility] to provide your oncology and hematology care.  ° °If you have a lab appointment with the Cancer Center, please go directly to the Cancer Center and check in at the registration area. °  °Wear comfortable clothing and clothing appropriate for easy access to any Portacath or PICC line.  ° °We strive to give you quality time with your provider. You may need to reschedule your appointment if you arrive late (15 or more minutes).  Arriving late affects you and other patients whose appointments are after yours.  Also, if you miss three or more appointments without notifying the office, you may be dismissed from the clinic at the provider’s discretion.    °  °For prescription refill requests, have your pharmacy contact our office and allow 72 hours for refills to be completed.   ° °Today you received the following chemotherapy and/or immunotherapy agents: Bevacizumab (Avastin)    °  °To help prevent nausea and vomiting after your treatment, we encourage you to take your nausea medication as directed. ° °BELOW ARE SYMPTOMS THAT SHOULD BE REPORTED IMMEDIATELY: °*FEVER GREATER THAN 100.4 F (38 °C) OR HIGHER °*CHILLS OR SWEATING °*NAUSEA AND VOMITING THAT IS NOT CONTROLLED WITH YOUR NAUSEA MEDICATION °*UNUSUAL SHORTNESS OF BREATH °*UNUSUAL BRUISING OR BLEEDING °*URINARY PROBLEMS (pain or burning when urinating, or frequent urination) °*BOWEL PROBLEMS (unusual diarrhea, constipation, pain near the anus) °TENDERNESS IN MOUTH AND THROAT WITH OR WITHOUT PRESENCE OF ULCERS (sore throat, sores in mouth, or a toothache) °UNUSUAL RASH, SWELLING OR PAIN  °UNUSUAL VAGINAL DISCHARGE OR ITCHING  ° °Items with * indicate a potential emergency and should be followed up as soon as possible or go to the Emergency Department if any problems should occur. ° °Please show the CHEMOTHERAPY ALERT CARD or IMMUNOTHERAPY ALERT CARD  at check-in to the Emergency Department and triage nurse. ° °Should you have questions after your visit or need to cancel or reschedule your appointment, please contact [facility]  Dept: [phone]  and follow the prompts.  Office hours are 8:00 a.m. to 4:30 p.m. Monday - Friday. Please note that voicemails left after 4:00 p.m. may not be returned until the following business day.  We are closed weekends and major holidays. You have access to a nurse at all times for urgent questions. Please call the main number to the clinic Dept: [phone] and follow the prompts. ° ° °For any non-urgent questions, you may also contact your provider using MyChart. We now offer e-Visits for anyone 18 and older to request care online for non-urgent symptoms. For details visit [email or web address]. °  °Also download the MyChart app! Go to the app store, search "MyChart", open the app, select [facility], and log in with your MyChart username and password. ° °Due to Covid, a mask is required upon entering the hospital/clinic. If you do not have a mask, one will be given to you upon arrival. For doctor visits, patients may have 1 support person aged 18 or older with them. For treatment visits, patients cannot have anyone with them due to current Covid guidelines and our immunocompromised population.  ° °

## 2021-05-20 NOTE — Progress Notes (Signed)
? ?[facility] [address]  ?[place], [place] [address] ?[phone] ? ? ?Interval Evaluation ? ?Date of Service: 05/20/21 ?Patient Name: Erika Mitchell ?Patient MRN: [id number] ?Patient DOB: [birth year] ?Provider: Ventura Sellers, MD ? ?Identifying Statement:  ?Erika Mitchell is a 66 y.o. female with right frontal glioblastoma  ? ?Oncologic History: ?Oncology History  ?Glioblastoma, IDH-wildtype ([place])  ?11/25/2020 Surgery  ? Craniotomy, resection with Dr. Zada Finders; path is c/w GBM, IDHwt ?  ?12/04/2020 - 12/07/2020 Hospital Admission  ? Admitted for worsening left sided weakness, MRI demonstrates likely post-surgical localized inflammatory process.  Improves with corticosteroids. ?  ?01/06/2021 - 02/18/2021 Radiation Therapy  ? IMRT and concurrent Temodar Isid[facility] Moos) ?  ?03/25/2021 -  Chemotherapy  ? Patient is on Treatment Plan : BRAIN GLIOBLASTOMA Consolidation Temozolomide Days 1-5 q28 Days   ?   ?05/03/2021 -  Chemotherapy  ? Patient is on Treatment Plan : BRAIN GBM Bevacizumab 14d x 6 cycles  ?   ? ? ?Biomarkers: ? ?MGMT Unknown.  ?IDH 1/2 Wild type.  ?EGFR Unknown  ?TERT Unknown  ? ?Interval History: ?Erika Mitchell presents today for second cycle of avastin.  She reports improvements in cognition, left sided weakness, lethargy since dosing the avastin two weeks ago.  She is currently ambulating on her own, not relying on assistance.  Decadron at 50m daily currently.  No seizures or headaches. ? ?Decadron ?03/18/21: 868m?03/25/21: 33m68m03/27/23: 35m32maper ?05/20/21:  ? ?H+P (12/17/20) Patient presented on 11/23/20 with several episodes of sudden dysfunction of left arm and leg, lasting several minutes, with most recent episode accompanied by frank speech arrest while at work.  First episode was less than one week ago.  Between, she has been normal, at baseline.  CNS imaging demonstrated enhancing mass in the right frontal lobe.  She underwent craniotomy, resection with  Dr. OsteZada Finders10/19/22, path demonstrated gliolblastoma.  On 10/28, she returned to the hospital with several days of progressive left sided weakness.  After improvement back to baseline with steroids, she was discharged to home.  She has no new complaints currently, is scheduled for repeat MRI brain next week prior to radiation therapy.   ? ?Medications: ?Current Outpatient Medications on File Prior to Visit  ?Medication Sig Dispense Refill  ? acetaminophen (TYLENOL) 500 MG tablet Take 500 mg by mouth every 6 (six) hours as needed for moderate pain. (Patient not taking: Reported on 05/03/2021)    ? ALPRAZolam (XANAX) 0.25 MG tablet Take 4.5 tablets (1.125 mg total) by mouth daily as needed (Vestibular neuritis). Patient states she takes 4 of the 0.25 mg tablets and then splits one 0.25 mg tablet to make total of 1.125 mg daily for vestibular neuritis (Patient not taking: Reported on 04/25/2021)    ? dexamethasone (DECADRON) 4 MG tablet Take 1 tablet (4 mg total) by mouth 3 (three) times daily.    ? levETIRAcetam (KEPPRA) 1000 MG tablet Take 1 tablet (1,000 mg total) by mouth 2 (two) times daily. 60 tablet 3  ? loperamide (IMODIUM) 2 MG capsule Take 1 capsule (2 mg total) by mouth 3 (three) times daily as needed for diarrhea or loose stools. (Patient not taking: Reported on 05/03/2021) 30 capsule 0  ? ondansetron (ZOFRAN) 8 MG tablet Take 1 tablet (8 mg total) by mouth 2 (two) times daily as needed (nausea and vomiting). May take 30-60 minutes prior to Temodar administration if nausea/vomiting occurs. (Patient not taking: Reported on 05/03/2021) 30  tablet 1  ? telmisartan (MICARDIS) 40 MG tablet Take 40 mg by mouth every evening.    ? ?No current facility-administered medications on file prior to visit.  ? ? ?Allergies:  ?Allergies  ?Allergen Reactions  ? Epinephrine Other (See Comments)  ?  "dizziness, rapid heartbeat"  ? Azithromycin   ?  dizziness  ? Motrin [Ibuprofen]   ?  dizziness  ? Ciprofloxacin   ?  Other  reaction(s): vertigo  ? Sulfa Antibiotics   ?  Other reaction(s): rash  ? ?Past Medical History:  ?Past Medical History:  ?Diagnosis Date  ? Cancer [facility])   ? basal cell carcinoma on left arm left eye brow  ? Hypertension   ? 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 Vertigo   ? 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 ?Past Surgical History:  ?Past Surgical History:  ?Procedure Laterality Date  ? APPLICATION OF CRANIAL NAVIGATION Right 11/25/2020  ? Procedure: APPLICATION OF CRANIAL NAVIGATION;  Surgeon: Judith Part, MD;  Location: [facility];  Service: Neurosurgery;  Laterality: Right;  ? CHOLECYSTECTOMY  2017  ? CRANIOTOMY Right 11/25/2020  ? Procedure: CRANIOTOMY FOR  TUMOR RESECTION WITH Lucky Rathke;  Surgeon: Judith Part, MD;  Location: [facility];  Service: Neurosurgery;  Laterality: Right;  ? laproscopy    ? surgery for endometriosisi  ? OOPHORECTOMY  [id number]  ? rt then left ovary  ? ?Social History:  ?Social History  ? ?Socioeconomic History  ? Marital status: Married  ?  Spouse name: [name]  ? Number of children: 1  ? Years of education: 4  ? Highest education level: Not on file  ?Occupational History  ?  Employer: [employer]  ?Tobacco Use  ? Smoking status: Former  ?  Types: Cigarettes  ?  Quit date: 02/08/1996  ?  Years since quitting: 25.2  ? Smokeless tobacco: Never  ?Vaping Use  ? Vaping Use: Never used  ?Substance and Sexual Activity  ? Alcohol use: Not Currently  ?  Alcohol/week: 2.0 standard drinks  ?  Types: 2 Glasses of wine per week  ? Drug use: No  ? Sexual activity: Not Currently  ?Other Topics Concern  ? Not on file  ?Social History Narrative  ? Patient is married [name]).    ? Patient is left-handed.  ? Patient is working full-time.  ? Patient has a college education (Associate's)  ? Patient has one child.  ? Patient drinks 3 Cups of coffee daily.  ? ?Social Determinants of Health  ? ?Financial Resource Strain: Not on file  ?Food Insecurity: Not on file   ?Transportation Needs: Not on file  ?Physical Activity: Not on file  ?Stress: Not on file  ?Social Connections: Not on file  ?Intimate Partner Violence: Not on file  ? ?Family History:  ?Family History  ?Problem Relation Age of Onset  ? Lung cancer Mother   ? Aneurysm Father   ? Heart disease Sister   ? Atrial fibrillation Brother   ? Colon cancer Neg Hx   ? ? ?Review of Systems: ?Constitutional: Doesn't report fevers, chills or abnormal weight loss ?Eyes: Doesn't report blurriness of vision ?Ears, nose, mouth, throat, and face: Doesn't report sore throat ?Respiratory: Doesn't report cough, dyspnea or wheezes ?Cardiovascular: Doesn't report palpitation, chest discomfort  ?Gastrointestinal:  Doesn't report nausea, constipation, diarrhea ?GU: Doesn't report incontinence ?Skin: Doesn't report skin rashes ?Neurological: Per HPI ?Musculoskeletal: Doesn't report joint pain ?Behavioral/Psych: Doesn't report anxiety ? ?Physical Exam: ?Vitals:  ?  05/20/21 1212  ?BP: (!) 149/83  ?Pulse: 67  ?Resp: 18  ?Temp: 97.7 ?F (36.5 ?C)  ?SpO2: 98%  ? ? ?KPS: 70. ?General: Alert, cooperative, pleasant, in no acute distress ?Head: Normal ?EENT: No conjunctival injection or scleral icterus.  ?Lungs: Resp effort normal ?Cardiac: Regular rate ?Abdomen: Non-distended abdomen ?Skin: No rashes cyanosis or petechiae. ?Extremities: No clubbing or edema ? ?Neurologic Exam: ?Mental Status: Awake, alert, attentive to examiner. Oriented to self and environment. Language is fluent with intact comprehension.  ?Cranial Nerves: Visual acuity is grossly normal. Visual fields are full. Extra-ocular movements intact. No ptosis. Face is symmetric ?Motor: Tone and bulk are normal. 4-/5 in left arm and handm, 4+/5 in left leg. Reflexes are symmetric, no pathologic reflexes present.  ?Sensory: Intact to light touch ?Gait: Hemiparetic ? ? ?Labs: ?I have reviewed the data as listed ?   ?Component Value Date/Time  ? NA 138 05/03/2021 1128  ? K 4.2 05/03/2021  1128  ? CL 107 05/03/2021 1128  ? CO2 23 05/03/2021 1128  ? GLUCOSE 95 05/03/2021 1128  ? BUN 17 05/03/2021 1128  ? CREATININE 0.72 05/03/2021 1128  ? CALCIUM 9.1 05/03/2021 1128  ? PROT 6.2 (L) 05/03/2021 1128  ? ALBUMIN

## 2021-05-21 ENCOUNTER — Telehealth: Payer: Self-pay | Admitting: Internal Medicine

## 2021-05-21 NOTE — Telephone Encounter (Signed)
Scheduled per 4/13 los, pt has been called, pt said they will check mychart ?

## 2021-05-25 ENCOUNTER — Other Ambulatory Visit: Payer: Self-pay | Admitting: Radiation Therapy

## 2021-05-27 DIAGNOSIS — L03031 Cellulitis of right toe: Secondary | ICD-10-CM | POA: Diagnosis not present

## 2021-05-27 DIAGNOSIS — I10 Essential (primary) hypertension: Secondary | ICD-10-CM | POA: Diagnosis not present

## 2021-05-27 DIAGNOSIS — Z1339 Encounter for screening examination for other mental health and behavioral disorders: Secondary | ICD-10-CM | POA: Diagnosis not present

## 2021-05-31 ENCOUNTER — Ambulatory Visit (INDEPENDENT_AMBULATORY_CARE_PROVIDER_SITE_OTHER): Payer: BC Managed Care – PPO | Admitting: Podiatry

## 2021-05-31 DIAGNOSIS — L03031 Cellulitis of right toe: Secondary | ICD-10-CM | POA: Diagnosis not present

## 2021-05-31 MED ORDER — CEPHALEXIN 500 MG PO CAPS: 500.0000 mg | ORAL_CAPSULE | Freq: Three times a day (TID) | ORAL | 0 refills | Status: AC

## 2021-05-31 NOTE — Patient Instructions (Signed)

## 2021-06-01 NOTE — Progress Notes (Signed)
?  Subjective:  ?Patient ID: Erika Mitchell, female    DOB: [birth year],  MRN: [id number] ? ?Chief Complaint  ?Patient presents with  ? Nail Problem  ?  (np) right foot great toe paronychia-on steroids and chemo  ? ? ?66 y.o. female presents with the above complaint. History confirmed with patient.  She was placed on Keflex 250 mg twice daily by her PCP.  Has not improved significantly ? ?Objective:  ?Physical Exam: ?warm, good capillary refill, no trophic changes or ulcerative lesions, normal DP and PT pulses, and normal sensory exam. ? ?Right Foot: Paronychia lateral border with erythema and mild serous drainage ? ?Assessment:  ?No diagnosis found. ? ? ?Plan:  ?Patient was evaluated and treated and all questions answered. ? ? ? ?Ingrown Nail, left ?-Patient elects to proceed with minor surgery to remove ingrown toenail today. Consent reviewed and signed by patient. ?-Ingrown nail excised. See procedure note. ?-Educated on post-procedure care including soaking. Written instructions provided and reviewed. ?-Patient to follow up in 3 weeks for nail check. ?-I increased her Keflex to 500 mg 3 times daily for the next 5 days ? ?Procedure: Excision of Ingrown Toenail ?Location: Right 1st toe lateral nail borders. ?Anesthesia: Lidocaine 1% plain; 1.5 mL and Marcaine 0.5% plain; 1.5 mL, digital block. ?Skin Prep: Betadine. ?Dressing: Silvadene; telfa; dry, sterile, compression dressing. ?Technique: Following skin prep, the toe was exsanguinated and a tourniquet was secured at the base of the toe. The affected nail border was freed, split with a nail splitter, and excised. The tourniquet was then removed and sterile dressing applied. ?Disposition: Patient tolerated procedure well. Patient to return in 3 weeks for follow-up.  ? ? ?Return in about 3 weeks (around 06/21/2021) for nail re-check.  ? ?

## 2021-06-03 ENCOUNTER — Other Ambulatory Visit: Payer: Self-pay

## 2021-06-03 ENCOUNTER — Inpatient Hospital Stay: Payer: BC Managed Care – PPO

## 2021-06-03 ENCOUNTER — Inpatient Hospital Stay (HOSPITAL_BASED_OUTPATIENT_CLINIC_OR_DEPARTMENT_OTHER): Payer: BC Managed Care – PPO | Admitting: Internal Medicine

## 2021-06-03 VITALS — BP 132/73 | HR 73 | Temp 97.5°F | Resp 20 | Wt 190.5 lb

## 2021-06-03 DIAGNOSIS — R569 Unspecified convulsions: Secondary | ICD-10-CM | POA: Diagnosis not present

## 2021-06-03 DIAGNOSIS — C719 Malignant neoplasm of brain, unspecified: Secondary | ICD-10-CM | POA: Diagnosis not present

## 2021-06-03 DIAGNOSIS — Z5112 Encounter for antineoplastic immunotherapy: Secondary | ICD-10-CM | POA: Diagnosis not present

## 2021-06-03 DIAGNOSIS — Y842 Radiological procedure and radiotherapy as the cause of abnormal reaction of the patient, or of later complication, without mention of misadventure at the time of the procedure: Secondary | ICD-10-CM

## 2021-06-03 DIAGNOSIS — C711 Malignant neoplasm of frontal lobe: Secondary | ICD-10-CM | POA: Diagnosis not present

## 2021-06-03 DIAGNOSIS — Z79899 Other long term (current) drug therapy: Secondary | ICD-10-CM | POA: Diagnosis not present

## 2021-06-03 LAB — CMP (CANCER CENTER ONLY)
ALT: 28 U/L (ref 0–44)
AST: 14 U/L — ABNORMAL LOW (ref 15–41)
Albumin: 4.1 g/dL (ref 3.5–5.0)
Alkaline Phosphatase: 47 U/L (ref 38–126)
Anion gap: 6 (ref 5–15)
BUN: 17 mg/dL (ref 8–23)
CO2: 25 mmol/L (ref 22–32)
Calcium: 9.5 mg/dL (ref 8.9–10.3)
Chloride: 108 mmol/L (ref 98–111)
Creatinine: 0.72 mg/dL (ref 0.44–1.00)
GFR, Estimated: >60 mL/min (ref >60)
Glucose, Bld: 126 mg/dL — ABNORMAL HIGH (ref 70–99)
Potassium: 4.1 mmol/L (ref 3.5–5.1)
Sodium: 139 mmol/L (ref 135–145)
Total Bilirubin: 0.6 mg/dL (ref 0.3–1.2)
Total Protein: 6.4 g/dL — ABNORMAL LOW (ref 6.5–8.1)

## 2021-06-03 LAB — CBC WITH DIFFERENTIAL (CANCER CENTER ONLY)
Abs Immature Granulocytes: 0.18 10*3/uL — ABNORMAL HIGH (ref 0.00–0.07)
Basophils Absolute: 0 10*3/uL (ref 0.0–0.1)
Basophils Relative: 0 %
Eosinophils Absolute: 0 10*3/uL (ref 0.0–0.5)
Eosinophils Relative: 0 %
HCT: 40.6 % (ref 36.0–46.0)
Hemoglobin: 13.7 g/dL (ref 12.0–15.0)
Immature Granulocytes: 2 %
Lymphocytes Relative: 7 %
Lymphs Abs: 0.6 10*3/uL — ABNORMAL LOW (ref 0.7–4.0)
MCH: 30.2 pg (ref 26.0–34.0)
MCHC: 33.7 g/dL (ref 30.0–36.0)
MCV: 89.6 fL (ref 80.0–100.0)
Monocytes Absolute: 0.4 10*3/uL (ref 0.1–1.0)
Monocytes Relative: 4 %
Neutro Abs: 7.5 10*3/uL (ref 1.7–7.7)
Neutrophils Relative %: 87 %
Platelet Count: 195 10*3/uL (ref 150–400)
RBC: 4.53 MIL/uL (ref 3.87–5.11)
RDW: 13.4 % (ref 11.5–15.5)
WBC Count: 8.6 10*3/uL (ref 4.0–10.5)
nRBC: 0 % (ref 0.0–0.2)

## 2021-06-03 LAB — TOTAL PROTEIN, URINE DIPSTICK: Protein, ur: NEGATIVE mg/dL

## 2021-06-03 MED ORDER — SODIUM CHLORIDE 0.9 % IV SOLN: Freq: Once | INTRAVENOUS | Status: AC

## 2021-06-03 MED ORDER — SODIUM CHLORIDE 0.9 % IV SOLN
10.0000 mg/kg | Freq: Once | INTRAVENOUS | Status: AC
  Administered 2021-06-03: 800 mg via INTRAVENOUS
  Filled 2021-06-03: qty 32

## 2021-06-03 NOTE — Progress Notes (Signed)
? ?[facility] [address]  ?[place], [place] [address] ?[phone] ? ? ?Interval Evaluation ? ?Date of Service: 06/03/21 ?Patient Name: Erika Mitchell ?Patient MRN: [id number] ?Patient DOB: 09-Apr-1955 ?Provider: Ventura Sellers, MD ? ?Identifying Statement:  ?Erika Mitchell is a 66 y.o. female with right frontal glioblastoma  ? ?Oncologic History: ?Oncology History  ?Glioblastoma, IDH-wildtype ([place])  ?11/25/2020 Surgery  ? Craniotomy, resection with Dr. Zada Finders; path is c/w GBM, IDHwt ?  ?12/04/2020 - 12/07/2020 Hospital Admission  ? Admitted for worsening left sided weakness, MRI demonstrates likely post-surgical localized inflammatory process.  Improves with corticosteroids. ?  ?01/06/2021 - 02/18/2021 Radiation Therapy  ? IMRT and concurrent Temodar Isidore Moos) ?  ?03/25/2021 -  Chemotherapy  ? Patient is on Treatment Plan : BRAIN GLIOBLASTOMA Consolidation Temozolomide Days 1-5 q28 Days   ? ?  ?  ?05/03/2021 -  Chemotherapy  ? Patient is on Treatment Plan : BRAIN GBM Bevacizumab 14d x 6 cycles  ? ?  ?  ? ? ?Biomarkers: ? ?MGMT Unknown.  ?IDH 1/2 Wild type.  ?EGFR Unknown  ?TERT Unknown  ? ?Interval History: ?ANAISE STERBENZ presents today for third infusion of avastin.  Cycle #2 TMZ was dosed on 06/22/21.  She reports overall stability regarding cognition, left sided weakness, lethargy.  She is currently ambulating on her own, not relying on assistance.  Decadron at 33m daily currently.  No seizures or headaches. ? ?Decadron ?03/18/21: 857m?03/25/21: 79m59m03/27/23: 34m28maper ?05/20/21:  ? ?H+P (12/17/20) Patient presented on 11/23/20 with several episodes of sudden dysfunction of left arm and leg, lasting several minutes, with most recent episode accompanied by frank speech arrest while at work.  First episode was less than one week ago.  Between, she has been normal, at baseline.  CNS imaging demonstrated enhancing mass in the right frontal lobe.  She underwent  craniotomy, resection with Dr. OsteZada Finders10/19/22, path demonstrated gliolblastoma.  On 10/28, she returned to the hospital with several days of progressive left sided weakness.  After improvement back to baseline with steroids, she was discharged to home.  She has no new complaints currently, is scheduled for repeat MRI brain next week prior to radiation therapy.   ? ?Medications: ?Current Outpatient Medications on File Prior to Visit  ?Medication Sig Dispense Refill  ? acetaminophen (TYLENOL) 500 MG tablet Take 500 mg by mouth every 6 (six) hours as needed for moderate pain. (Patient not taking: Reported on 05/03/2021)    ? ALPRAZolam (XANAX) 0.25 MG tablet Take 4.5 tablets (1.125 mg total) by mouth daily as needed (Vestibular neuritis). Patient states she takes 4 of the 0.25 mg tablets and then splits one 0.25 mg tablet to make total of 1.125 mg daily for vestibular neuritis (Patient not taking: Reported on 04/25/2021)    ? cephALEXin (KEFLEX) 500 MG capsule Take 1 capsule (500 mg total) by mouth 3 (three) times daily for 5 days. 15 capsule 0  ? dexamethasone (DECADRON) 1 MG tablet Take 3 tablets (3 mg total) by mouth daily with breakfast for 7 days, THEN 2 tablets (2 mg total) daily with breakfast for 7 days, THEN 1 tablet (1 mg total) daily with breakfast for 7 days. 42 tablet 0  ? levETIRAcetam (KEPPRA) 1000 MG tablet Take 1 tablet (1,000 mg total) by mouth 2 (two) times daily. 60 tablet 3  ? loperamide (IMODIUM) 2 MG capsule Take 1 capsule (2 mg total) by mouth 3 (three) times daily  as needed for diarrhea or loose stools. (Patient not taking: Reported on 05/03/2021) 30 capsule 0  ? ondansetron (ZOFRAN) 8 MG tablet Take 1 tablet (8 mg total) by mouth 2 (two) times daily as needed (nausea and vomiting). May take 30-60 minutes prior to Temodar administration if nausea/vomiting occurs. (Patient not taking: Reported on 05/03/2021) 30 tablet 1  ? telmisartan (MICARDIS) 40 MG tablet Take 40 mg by mouth every evening.     ? ?No current facility-administered medications on file prior to visit.  ? ? ?Allergies:  ?Allergies  ?Allergen Reactions  ? Epinephrine Other (See Comments)  ?  "dizziness, rapid heartbeat"  ? Azithromycin   ?  dizziness  ? Motrin [Ibuprofen]   ?  dizziness  ? Ciprofloxacin   ?  Other reaction(s): vertigo  ? Lisinopril Other (See Comments)  ? Sulfa Antibiotics   ?  Other reaction(s): rash  ? ?Past Medical History:  ?Past Medical History:  ?Diagnosis Date  ? Cancer [facility])   ? basal cell carcinoma on left arm left eye brow  ? Hypertension   ? 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 Vertigo   ? 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 ?Past Surgical History:  ?Past Surgical History:  ?Procedure Laterality Date  ? APPLICATION OF CRANIAL NAVIGATION Right 11/25/2020  ? Procedure: APPLICATION OF CRANIAL NAVIGATION;  Surgeon: Judith Part, MD;  Location: [facility];  Service: Neurosurgery;  Laterality: Right;  ? CHOLECYSTECTOMY  2017  ? CRANIOTOMY Right 11/25/2020  ? Procedure: CRANIOTOMY FOR  TUMOR RESECTION WITH Lucky Rathke;  Surgeon: Judith Part, MD;  Location: [facility];  Service: Neurosurgery;  Laterality: Right;  ? laproscopy    ? surgery for endometriosisi  ? OOPHORECTOMY  [id number]  ? rt then left ovary  ? ?Social History:  ?Social History  ? ?Socioeconomic History  ? Marital status: Married  ?  Spouse name: [name]  ? Number of children: 1  ? Years of education: 68  ? Highest education level: Not on file  ?Occupational History  ?  Employer: [employer]  ?Tobacco Use  ? Smoking status: Former  ?  Types: Cigarettes  ?  Quit date: 02/08/1996  ?  Years since quitting: 25.3  ? Smokeless tobacco: Never  ?Vaping Use  ? Vaping Use: Never used  ?Substance and Sexual Activity  ? Alcohol use: Not Currently  ?  Alcohol/week: 2.0 standard drinks  ?  Types: 2 Glasses of wine per week  ? Drug use: No  ? Sexual activity: Not Currently  ?Other Topics Concern  ? Not on file  ?Social History Narrative  ?  Patient is married [name]).    ? Patient is left-handed.  ? Patient is working full-time.  ? Patient has a college education (Associate's)  ? Patient has one child.  ? Patient drinks 3 Cups of coffee daily.  ? ?Social Determinants of Health  ? ?Financial Resource Strain: Not on file  ?Food Insecurity: Not on file  ?Transportation Needs: Not on file  ?Physical Activity: Not on file  ?Stress: Not on file  ?Social Connections: Not on file  ?Intimate Partner Violence: Not on file  ? ?Family History:  ?Family History  ?Problem Relation Age of Onset  ? Lung cancer Mother   ? Aneurysm Father   ? Heart disease Sister   ? Atrial fibrillation Brother   ? Colon cancer Neg Hx   ? ? ?Review of Systems: ?Constitutional: Doesn't report fevers, chills or abnormal weight  loss ?Eyes: Doesn't report blurriness of vision ?Ears, nose, mouth, throat, and face: Doesn't report sore throat ?Respiratory: Doesn't report cough, dyspnea or wheezes ?Cardiovascular: Doesn't report palpitation, chest discomfort  ?Gastrointestinal:  Doesn't report nausea, constipation, diarrhea ?GU: Doesn't report incontinence ?Skin: Doesn't report skin rashes ?Neurological: Per HPI ?Musculoskeletal: Doesn't report joint pain ?Behavioral/Psych: Doesn't report anxiety ? ?Physical Exam: ?Vitals:  ? 06/03/21 1346  ?BP: 132/73  ?Pulse: 73  ?Resp: 20  ?Temp: (!) 97.5 ?F (36.4 ?C)  ?SpO2: 99%  ? ?KPS: 70. ?General: Alert, cooperative, pleasant, in no acute distress ?Head: Normal ?EENT: No conjunctival injection or scleral icterus.  ?Lungs: Resp effort normal ?Cardiac: Regular rate ?Abdomen: Non-distended abdomen ?Skin: No rashes cyanosis or petechiae. ?Extremities: No clubbing or edema ? ?Neurologic Exam: ?Mental Status: Awake, alert, attentive to examiner. Oriented to self and environment. Language is fluent with intact comprehension.  ?Cranial Nerves: Visual acuity is grossly normal. Visual fields are full. Extra-ocular movements intact. No ptosis. Face is  symmetric ?Motor: Tone and bulk are normal. 4-/5 in left arm and handm, 4+/5 in left leg. Reflexes are symmetric, no pathologic reflexes present.  ?Sensory: Intact to light touch ?Gait: Hemiparetic ? ? ?Labs: ?I have reviewed

## 2021-06-03 NOTE — Patient Instructions (Signed)
Erika Mitchell  Discharge Instructions: °Thank you for choosing [facility] to provide your Mitchell and hematology care.  ° °If you have a lab appointment with the Cancer Center, please go directly to the Cancer Center and check in at the registration area. °  °Wear comfortable clothing and clothing appropriate for easy access to any Portacath or PICC line.  ° °We strive to give you quality time with your provider. You may need to reschedule your appointment if you arrive late (15 or more minutes).  Arriving late affects you and other patients whose appointments are after yours.  Also, if you miss three or more appointments without notifying the office, you may be dismissed from the clinic at the provider’s discretion.    °  °For prescription refill requests, have your pharmacy contact our office and allow 72 hours for refills to be completed.   ° °Today you received the following chemotherapy and/or immunotherapy agents: bevacizumab    °  °To help prevent nausea and vomiting after your treatment, we encourage you to take your nausea medication as directed. ° °BELOW ARE SYMPTOMS THAT SHOULD BE REPORTED IMMEDIATELY: °*FEVER GREATER THAN 100.4 F (38 °C) OR HIGHER °*CHILLS OR SWEATING °*NAUSEA AND VOMITING THAT IS NOT CONTROLLED WITH YOUR NAUSEA MEDICATION °*UNUSUAL SHORTNESS OF BREATH °*UNUSUAL BRUISING OR BLEEDING °*URINARY PROBLEMS (pain or burning when urinating, or frequent urination) °*BOWEL PROBLEMS (unusual diarrhea, constipation, pain near the anus) °TENDERNESS IN MOUTH AND THROAT WITH OR WITHOUT PRESENCE OF ULCERS (sore throat, sores in mouth, or a toothache) °UNUSUAL RASH, SWELLING OR PAIN  °UNUSUAL VAGINAL DISCHARGE OR ITCHING  ° °Items with * indicate a potential emergency and should be followed up as soon as possible or go to the Emergency Department if any problems should occur. ° °Please show the CHEMOTHERAPY ALERT CARD or IMMUNOTHERAPY ALERT CARD at check-in  to the Emergency Department and triage nurse. ° °Should you have questions after your visit or need to cancel or reschedule your appointment, please contact [facility]  Dept: [phone]  and follow the prompts.  Office hours are 8:00 a.m. to 4:30 p.m. Monday - Friday. Please note that voicemails left after 4:00 p.m. may not be returned until the following business day.  We are closed weekends and major holidays. You have access to a nurse at all times for urgent questions. Please call the main number to the clinic Dept: [phone] and follow the prompts. ° ° °For any non-urgent questions, you may also contact your provider using MyChart. We now offer e-Visits for anyone 18 and older to request care online for non-urgent symptoms. For details visit [email or web address]. °  °Also download the MyChart app! Go to the app store, search "MyChart", open the app, select , and log in with your MyChart username and password. ° °Due to Covid, a mask is required upon entering the hospital/clinic. If you do not have a mask, one will be given to you upon arrival. For doctor visits, patients may have 1 support person aged 18 or older with them. For treatment visits, patients cannot have anyone with them due to current Covid guidelines and our immunocompromised population.  ° °

## 2021-06-04 ENCOUNTER — Telehealth: Payer: Self-pay | Admitting: Internal Medicine

## 2021-06-04 NOTE — Telephone Encounter (Signed)
Per 4/27 los called and spoke to pt about appointment. Pt confirmed appointment  ?

## 2021-06-14 ENCOUNTER — Other Ambulatory Visit: Payer: Self-pay | Admitting: Internal Medicine

## 2021-06-14 ENCOUNTER — Telehealth: Payer: Self-pay | Admitting: *Deleted

## 2021-06-14 MED ORDER — DEXAMETHASONE 4 MG PO TABS: 4.0000 mg | ORAL_TABLET | Freq: Every day | ORAL | 1 refills | Status: DC

## 2021-06-14 NOTE — Telephone Encounter (Signed)
Spoke with patients spouse about new medication instructions.  Stated understanding and has no further questions at this time. ?

## 2021-06-14 NOTE — Telephone Encounter (Signed)
Patients spouse called to report change in patient status.  States that she is having "inflammation in the brain again".  She has decreased strength in the left side/confusion/and difficulty walking.  He is questioning if she should be back on decadron.  States he had 3 tablets remaining of Decadron 1 mg and gave one on Saturday/Sunday/Monday and has not seen much improvement. ? ?Pharmacy on file: [facility]. ? ?Routing to provider. ?

## 2021-06-15 ENCOUNTER — Other Ambulatory Visit (HOSPITAL_COMMUNITY): Payer: Self-pay

## 2021-06-17 ENCOUNTER — Other Ambulatory Visit: Payer: Self-pay | Admitting: Internal Medicine

## 2021-06-17 ENCOUNTER — Inpatient Hospital Stay: Payer: BC Managed Care – PPO | Attending: Radiation Oncology

## 2021-06-17 ENCOUNTER — Other Ambulatory Visit: Payer: Self-pay | Admitting: *Deleted

## 2021-06-17 ENCOUNTER — Inpatient Hospital Stay (HOSPITAL_BASED_OUTPATIENT_CLINIC_OR_DEPARTMENT_OTHER): Payer: BC Managed Care – PPO | Admitting: Internal Medicine

## 2021-06-17 ENCOUNTER — Inpatient Hospital Stay: Payer: BC Managed Care – PPO

## 2021-06-17 ENCOUNTER — Other Ambulatory Visit: Payer: Self-pay

## 2021-06-17 VITALS — BP 143/78 | HR 76 | Temp 98.2°F | Resp 17 | Ht 67.0 in | Wt 190.1 lb

## 2021-06-17 DIAGNOSIS — C719 Malignant neoplasm of brain, unspecified: Secondary | ICD-10-CM

## 2021-06-17 DIAGNOSIS — C711 Malignant neoplasm of frontal lobe: Secondary | ICD-10-CM | POA: Diagnosis not present

## 2021-06-17 DIAGNOSIS — Y842 Radiological procedure and radiotherapy as the cause of abnormal reaction of the patient, or of later complication, without mention of misadventure at the time of the procedure: Secondary | ICD-10-CM

## 2021-06-17 DIAGNOSIS — Z5112 Encounter for antineoplastic immunotherapy: Secondary | ICD-10-CM | POA: Insufficient documentation

## 2021-06-17 DIAGNOSIS — C7951 Secondary malignant neoplasm of bone: Secondary | ICD-10-CM | POA: Insufficient documentation

## 2021-06-17 DIAGNOSIS — Z79899 Other long term (current) drug therapy: Secondary | ICD-10-CM | POA: Diagnosis not present

## 2021-06-17 DIAGNOSIS — R569 Unspecified convulsions: Secondary | ICD-10-CM

## 2021-06-17 LAB — CBC WITH DIFFERENTIAL (CANCER CENTER ONLY)
Abs Immature Granulocytes: 0.07 10*3/uL (ref 0.00–0.07)
Basophils Absolute: 0 10*3/uL (ref 0.0–0.1)
Basophils Relative: 0 %
Eosinophils Absolute: 0 10*3/uL (ref 0.0–0.5)
Eosinophils Relative: 0 %
HCT: 39.2 % (ref 36.0–46.0)
Hemoglobin: 13.7 g/dL (ref 12.0–15.0)
Immature Granulocytes: 1 %
Lymphocytes Relative: 8 %
Lymphs Abs: 0.7 10*3/uL (ref 0.7–4.0)
MCH: 31.1 pg (ref 26.0–34.0)
MCHC: 34.9 g/dL (ref 30.0–36.0)
MCV: 89.1 fL (ref 80.0–100.0)
Monocytes Absolute: 0.4 10*3/uL (ref 0.1–1.0)
Monocytes Relative: 4 %
Neutro Abs: 7.7 10*3/uL (ref 1.7–7.7)
Neutrophils Relative %: 87 %
Platelet Count: 170 10*3/uL (ref 150–400)
RBC: 4.4 MIL/uL (ref 3.87–5.11)
RDW: 13.7 % (ref 11.5–15.5)
WBC Count: 8.9 10*3/uL (ref 4.0–10.5)
nRBC: 0 % (ref 0.0–0.2)

## 2021-06-17 LAB — TOTAL PROTEIN, URINE DIPSTICK: Protein, ur: NEGATIVE mg/dL

## 2021-06-17 LAB — CMP (CANCER CENTER ONLY)
ALT: 15 U/L (ref 0–44)
AST: 11 U/L — ABNORMAL LOW (ref 15–41)
Albumin: 3.9 g/dL (ref 3.5–5.0)
Alkaline Phosphatase: 46 U/L (ref 38–126)
Anion gap: 8 (ref 5–15)
BUN: 13 mg/dL (ref 8–23)
CO2: 25 mmol/L (ref 22–32)
Calcium: 9.2 mg/dL (ref 8.9–10.3)
Chloride: 109 mmol/L (ref 98–111)
Creatinine: 0.75 mg/dL (ref 0.44–1.00)
GFR, Estimated: >60 mL/min (ref >60)
Glucose, Bld: 112 mg/dL — ABNORMAL HIGH (ref 70–99)
Potassium: 3.4 mmol/L — ABNORMAL LOW (ref 3.5–5.1)
Sodium: 142 mmol/L (ref 135–145)
Total Bilirubin: 0.5 mg/dL (ref 0.3–1.2)
Total Protein: 6.6 g/dL (ref 6.5–8.1)

## 2021-06-17 NOTE — Progress Notes (Signed)
? ?[facility] [address]  ?[place], [place] [address] ?[phone] ? ? ?Interval Evaluation ? ?Date of Service: 06/17/21 ?Patient Name: Erika Mitchell ?Patient MRN: [id number] ?Patient DOB: 1955-12-15 ?Provider: Ventura Sellers, MD ? ?Identifying Statement:  ?Erika Mitchell is a 66 y.o. female with right frontal glioblastoma  ? ?Oncologic History: ?Oncology History  ?Glioblastoma, IDH-wildtype ([place])  ?11/25/2020 Surgery  ? Craniotomy, resection with Dr. Zada Finders; path is c/w GBM, IDHwt ?  ?12/04/2020 - 12/07/2020 Hospital Admission  ? Admitted for worsening left sided weakness, MRI demonstrates likely post-surgical localized inflammatory process.  Improves with corticosteroids. ?  ?01/06/2021 - 02/18/2021 Radiation Therapy  ? IMRT and concurrent Temodar Isidore Moos) ?  ?03/25/2021 -  Chemotherapy  ? Patient is on Treatment Plan : BRAIN GLIOBLASTOMA Consolidation Temozolomide Days 1-5 q28 Days   ? ?   ?05/03/2021 -  Chemotherapy  ? Patient is on Treatment Plan : BRAIN GBM Bevacizumab 14d x 6 cycles  ? ?   ? ? ?Biomarkers: ? ?MGMT Unknown.  ?IDH 1/2 Wild type.  ?EGFR Unknown  ?TERT Unknown  ? ?Interval History: ?Erika Mitchell presents today for #4 infusion of avastin.  She has also nearly completed cycle #2 of 5-day Temodar.  After reported worsening weakness this past week, she reports no significant improvement despite dosing decadron 55m x2 days, then 425m  Right side may be weak this time.  Clearly functional status has declined.  She also reports some right lower back pain, urinary urgency today. ? ?Decadron ?03/18/21: 2m20m02/16/23: 4mg12m3/27/23: 12mg34mper ?05/20/21:  ? ?H+P (12/17/20) Patient presented on 11/23/20 with several episodes of sudden dysfunction of left arm and leg, lasting several minutes, with most recent episode accompanied by frank speech arrest while at work.  First episode was less than one week ago.  Between, she has been normal, at baseline.  CNS  imaging demonstrated enhancing mass in the right frontal lobe.  She underwent craniotomy, resection with Dr. OsterZada Finders0/19/22, path demonstrated gliolblastoma.  On 10/28, she returned to the hospital with several days of progressive left sided weakness.  After improvement back to baseline with steroids, she was discharged to home.  She has no new complaints currently, is scheduled for repeat MRI brain next week prior to radiation therapy.   ? ?Medications: ?Current Outpatient Medications on File Prior to Visit  ?Medication Sig Dispense Refill  ? acetaminophen (TYLENOL) 500 MG tablet Take 500 mg by mouth every 6 (six) hours as needed for moderate pain.    ? ALPRAZolam (XANAX) 0.25 MG tablet Take 4.5 tablets (1.125 mg total) by mouth daily as needed (Vestibular neuritis). Patient states she takes 4 of the 0.25 mg tablets and then splits one 0.25 mg tablet to make total of 1.125 mg daily for vestibular neuritis    ? dexamethasone (DECADRON) 4 MG tablet Take 1 tablet (4 mg total) by mouth daily. 30 tablet 1  ? levETIRAcetam (KEPPRA) 1000 MG tablet Take 1 tablet (1,000 mg total) by mouth 2 (two) times daily. 60 tablet 3  ? loperamide (IMODIUM) 2 MG capsule Take 1 capsule (2 mg total) by mouth 3 (three) times daily as needed for diarrhea or loose stools. 30 capsule 0  ? ondansetron (ZOFRAN) 8 MG tablet Take 1 tablet (8 mg total) by mouth 2 (two) times daily as needed (nausea and vomiting). May take 30-60 minutes prior to Temodar administration if nausea/vomiting occurs. 30 tablet 1  ? telmisartan (MICARDIS)  40 MG tablet Take 40 mg by mouth every evening.    ? ?No current facility-administered medications on file prior to visit.  ? ? ?Allergies:  ?Allergies  ?Allergen Reactions  ? Epinephrine Other (See Comments)  ?  "dizziness, rapid heartbeat"  ? Azithromycin   ?  dizziness  ? Motrin [Ibuprofen]   ?  dizziness  ? Ciprofloxacin   ?  Other reaction(s): vertigo  ? Lisinopril Other (See Comments)  ? Sulfa Antibiotics    ?  Other reaction(s): rash  ? ?Past Medical History:  ?Past Medical History:  ?Diagnosis Date  ? Cancer [facility])   ? basal cell carcinoma on left arm left eye brow  ? Hypertension   ? 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 Vertigo   ? 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 ?Past Surgical History:  ?Past Surgical History:  ?Procedure Laterality Date  ? APPLICATION OF CRANIAL NAVIGATION Right 11/25/2020  ? Procedure: APPLICATION OF CRANIAL NAVIGATION;  Surgeon: Judith Part, MD;  Location: [facility];  Service: Neurosurgery;  Laterality: Right;  ? CHOLECYSTECTOMY  2017  ? CRANIOTOMY Right 11/25/2020  ? Procedure: CRANIOTOMY FOR  TUMOR RESECTION WITH Lucky Rathke;  Surgeon: Judith Part, MD;  Location: [facility];  Service: Neurosurgery;  Laterality: Right;  ? laproscopy    ? surgery for endometriosisi  ? OOPHORECTOMY  [id number]  ? rt then left ovary  ? ?Social History:  ?Social History  ? ?Socioeconomic History  ? Marital status: Married  ?  Spouse name: Erika Mitchell  ? Number of children: 1  ? Years of education: 87  ? Highest education level: Not on file  ?Occupational History  ?  Employer: [employer]  ?Tobacco Use  ? Smoking status: Former  ?  Types: Cigarettes  ?  Quit date: 02/08/1996  ?  Years since quitting: 25.3  ? Smokeless tobacco: Never  ?Vaping Use  ? Vaping Use: Never used  ?Substance and Sexual Activity  ? Alcohol use: Not Currently  ?  Alcohol/week: 2.0 standard drinks  ?  Types: 2 Glasses of wine per week  ? Drug use: No  ? Sexual activity: Not Currently  ?Other Topics Concern  ? Not on file  ?Social History Narrative  ? Patient is married Erika Mitchell).    ? Patient is left-handed.  ? Patient is working full-time.  ? Patient has a college education (Associate's)  ? Patient has one child.  ? Patient drinks 3 Cups of coffee daily.  ? ?Social Determinants of Health  ? ?Financial Resource Strain: Not on file  ?Food Insecurity: Not on file  ?Transportation Needs: Not on file  ?Physical Activity:  Not on file  ?Stress: Not on file  ?Social Connections: Not on file  ?Intimate Partner Violence: Not on file  ? ?Family History:  ?Family History  ?Problem Relation Age of Onset  ? Lung cancer Mother   ? Aneurysm Father   ? Heart disease Sister   ? Atrial fibrillation Brother   ? Colon cancer Neg Hx   ? ? ?Review of Systems: ?Constitutional: Doesn't report fevers, chills or abnormal weight loss ?Eyes: Doesn't report blurriness of vision ?Ears, nose, mouth, throat, and face: Doesn't report sore throat ?Respiratory: Doesn't report cough, dyspnea or wheezes ?Cardiovascular: Doesn't report palpitation, chest discomfort  ?Gastrointestinal:  Doesn't report nausea, constipation, diarrhea ?GU: Doesn't report incontinence ?Skin: Doesn't report skin rashes ?Neurological: Per HPI ?Musculoskeletal: Doesn't report joint pain ?Behavioral/Psych: Doesn't report anxiety ? ?Physical Exam: ?Vitals:  ?  06/17/21 1203  ?BP: (!) 143/78  ?Pulse: 76  ?Resp: 17  ?Temp: 98.2 ?F (36.8 ?C)  ?SpO2: 99%  ? ?KPS: 60. ?General: Alert, cooperative, Erika, in no acute distress ?Head: Normal ?EENT: No conjunctival injection or scleral icterus.  ?Lungs: Resp effort normal ?Cardiac: Regular rate ?Abdomen: Non-distended abdomen ?Skin: No rashes cyanosis or petechiae. ?Extremities: No clubbing or edema ? ?Neurologic Exam: ?Mental Status: Awake, alert, attentive to examiner. Oriented to self and environment. Language is fluent with intact comprehension.  ?Cranial Nerves: Visual acuity is grossly normal. Visual fields are full. Extra-ocular movements intact. No ptosis. Face is symmetric ?Motor: Tone and bulk are normal. 4-/5 in left arm and handm, 4+/5 in left leg. Right arm drift noted today 4/5. Reflexes are symmetric, no pathologic reflexes present.  ?Sensory: Intact to light touch ?Gait: Hemiparetic ? ? ?Labs: ?I have reviewed the data as listed ?   ?Component Value Date/Time  ? NA 139 06/03/2021 1320  ? K 4.1 06/03/2021 1320  ? CL 108 06/03/2021  1320  ? CO2 25 06/03/2021 1320  ? GLUCOSE 126 (H) 06/03/2021 1320  ? BUN 17 06/03/2021 1320  ? CREATININE 0.72 06/03/2021 1320  ? CALCIUM 9.5 06/03/2021 1320  ? PROT 6.4 (L) 06/03/2021 1320  ? ALBUMIN 4.1 04

## 2021-06-18 ENCOUNTER — Telehealth: Payer: Self-pay | Admitting: Internal Medicine

## 2021-06-18 ENCOUNTER — Encounter: Payer: Self-pay | Admitting: Internal Medicine

## 2021-06-18 ENCOUNTER — Other Ambulatory Visit (HOSPITAL_COMMUNITY): Payer: Self-pay

## 2021-06-18 ENCOUNTER — Ambulatory Visit (HOSPITAL_COMMUNITY)
Admission: RE | Admit: 2021-06-18 | Discharge: 2021-06-18 | Disposition: A | Discharge status: Home / Self Care | Payer: BC Managed Care – PPO | Source: Ambulatory Visit | Attending: Internal Medicine | Admitting: Internal Medicine

## 2021-06-18 DIAGNOSIS — C711 Malignant neoplasm of frontal lobe: Secondary | ICD-10-CM | POA: Diagnosis not present

## 2021-06-18 DIAGNOSIS — C719 Malignant neoplasm of brain, unspecified: Secondary | ICD-10-CM | POA: Diagnosis not present

## 2021-06-18 DIAGNOSIS — G9389 Other specified disorders of brain: Secondary | ICD-10-CM | POA: Diagnosis not present

## 2021-06-18 MED ORDER — GADOBUTROL 1 MMOL/ML IV SOLN
8.0000 mL | Freq: Once | INTRAVENOUS | Status: AC | PRN
  Administered 2021-06-18: 7.5 mL via INTRAVENOUS

## 2021-06-18 NOTE — Telephone Encounter (Signed)
Per 5/11 los called and left message for pt about appointment call back number if changes are needed  ?

## 2021-06-21 ENCOUNTER — Telehealth: Payer: Self-pay | Admitting: *Deleted

## 2021-06-21 NOTE — Telephone Encounter (Signed)
Informed the patients husband

## 2021-06-21 NOTE — Telephone Encounter (Signed)
Patient's husband is wanting to know if physician will trim all her nails when she comes in tomorrow for the nail check.  ?Explained that we normally have other doctors at the office that will do that and she could schedule with one of them.He verbalized understanding and said ok. ?

## 2021-06-22 ENCOUNTER — Ambulatory Visit (INDEPENDENT_AMBULATORY_CARE_PROVIDER_SITE_OTHER): Payer: BC Managed Care – PPO | Admitting: Podiatry

## 2021-06-22 DIAGNOSIS — M79674 Pain in right toe(s): Secondary | ICD-10-CM

## 2021-06-22 DIAGNOSIS — M79675 Pain in left toe(s): Secondary | ICD-10-CM | POA: Diagnosis not present

## 2021-06-22 DIAGNOSIS — B351 Tinea unguium: Secondary | ICD-10-CM | POA: Diagnosis not present

## 2021-06-22 DIAGNOSIS — L03031 Cellulitis of right toe: Secondary | ICD-10-CM

## 2021-06-23 ENCOUNTER — Ambulatory Visit (HOSPITAL_COMMUNITY)
Admission: RE | Admit: 2021-06-23 | Discharge: 2021-06-23 | Disposition: A | Discharge status: Home / Self Care | Payer: BC Managed Care – PPO | Source: Ambulatory Visit | Attending: Internal Medicine | Admitting: Internal Medicine

## 2021-06-23 DIAGNOSIS — C719 Malignant neoplasm of brain, unspecified: Secondary | ICD-10-CM | POA: Insufficient documentation

## 2021-06-23 DIAGNOSIS — M5126 Other intervertebral disc displacement, lumbar region: Secondary | ICD-10-CM | POA: Diagnosis not present

## 2021-06-23 DIAGNOSIS — M5124 Other intervertebral disc displacement, thoracic region: Secondary | ICD-10-CM | POA: Diagnosis not present

## 2021-06-23 DIAGNOSIS — M47812 Spondylosis without myelopathy or radiculopathy, cervical region: Secondary | ICD-10-CM | POA: Diagnosis not present

## 2021-06-23 DIAGNOSIS — R531 Weakness: Secondary | ICD-10-CM | POA: Diagnosis not present

## 2021-06-23 DIAGNOSIS — Z85841 Personal history of malignant neoplasm of brain: Secondary | ICD-10-CM | POA: Diagnosis not present

## 2021-06-23 IMAGING — MR MR TOTAL SPINE METS SCREENING
6 of 10 series · 18 of 48 positions shown · IV contrast (gadavist)
Comparison: None Available.

CLINICAL DATA: Glioblastoma staging.  Back pain, weakness

EXAM:
MRI TOTAL SPINE WITHOUT AND WITH CONTRAST
TECHNIQUE: Multisequence MR imaging of the spine from the cervical spine to the
sacrum was performed prior to and following IV contrast
administration for evaluation of spinal metastatic disease.
CONTRAST:  4mL GADAVIST GADOBUTROL 1 MMOL/ML IV SOLN

[Series 3: STIR · sagittal · 3.0mm · 0.59mm/px · 3 of 17 slices shown (1 of 2)]
[im 1/17]
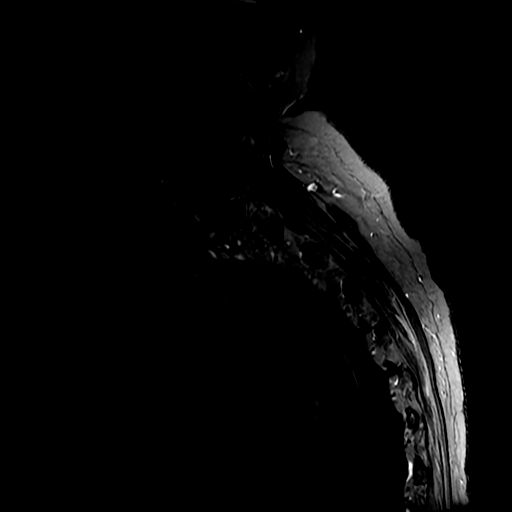
[im 9/17]
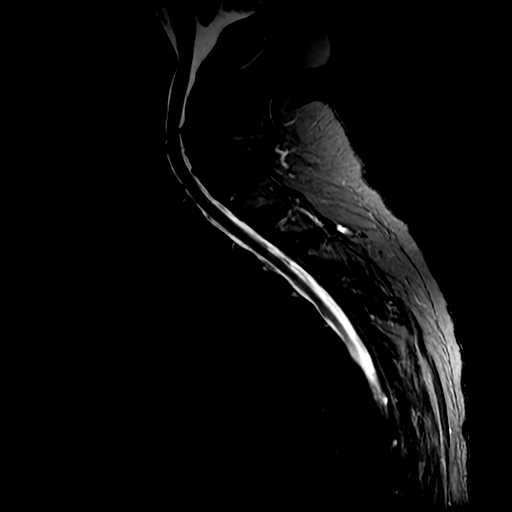
[im 17/17]
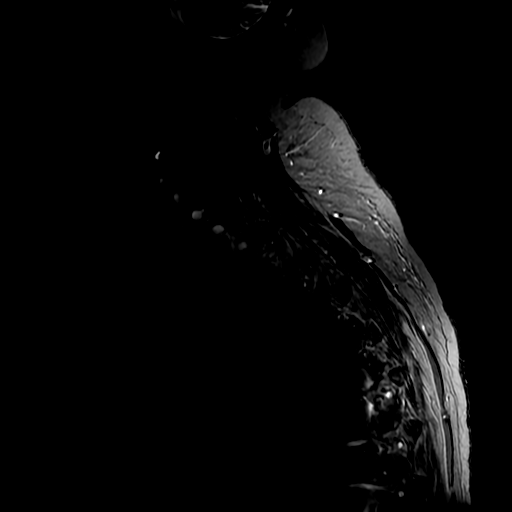

[Series 4: T1 · sagittal · 3.0mm · 0.59mm/px · 3 of 17 slices shown (1 of 2)]
[im 1/17]
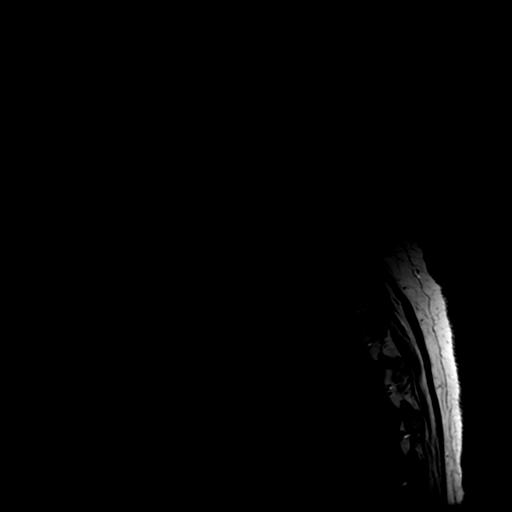
[im 9/17]
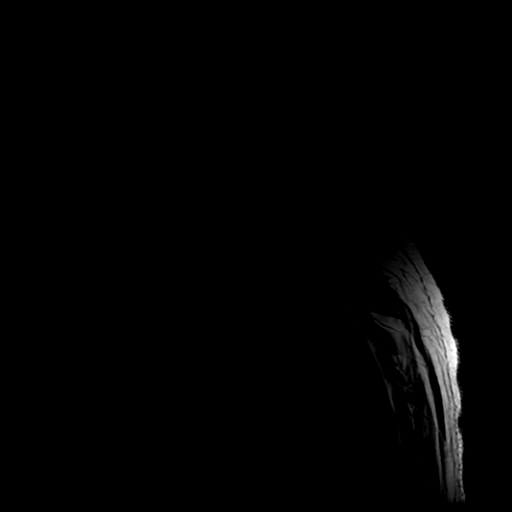
[im 17/17]
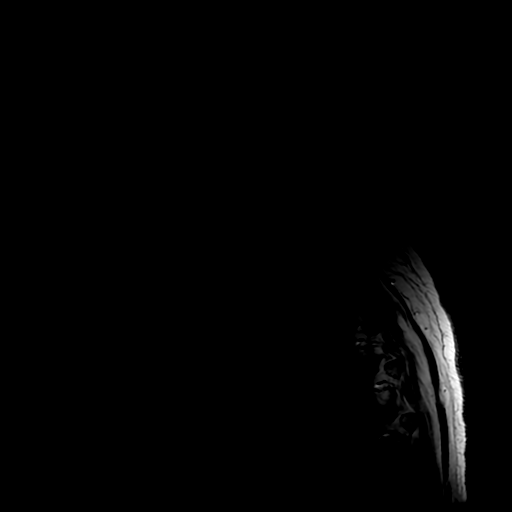

[Series 6: STIR · sagittal · 3.0mm · 0.66mm/px · 3 of 22 slices shown (2 of 2)]
[im 1/22]
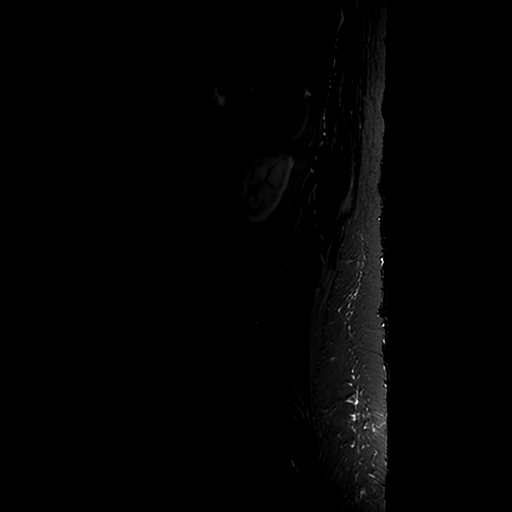
[im 11/22]
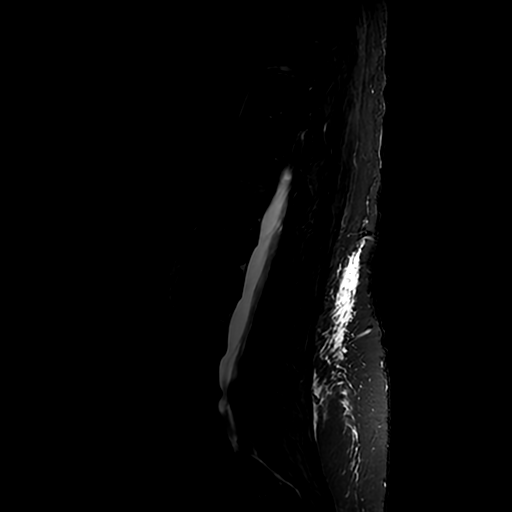
[im 22/22]
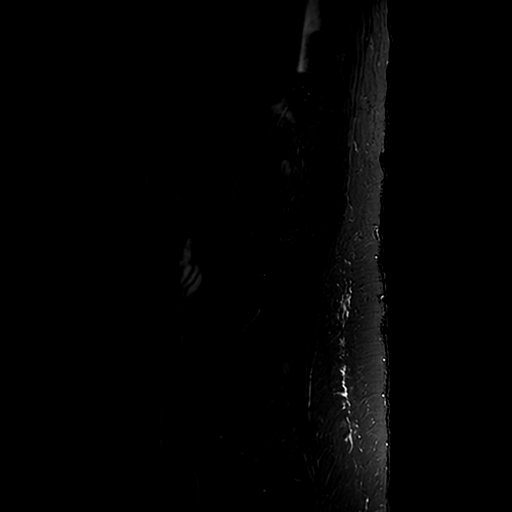

[Series 7: T1 · sagittal · 3.0mm · 0.66mm/px · 3 of 22 slices shown (2 of 2)]
[im 1/22]
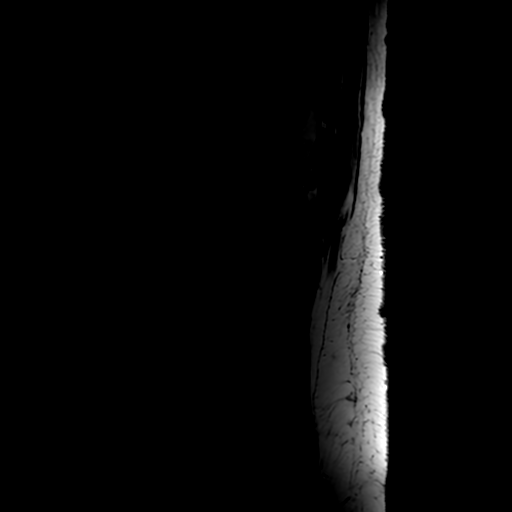
[im 11/22]
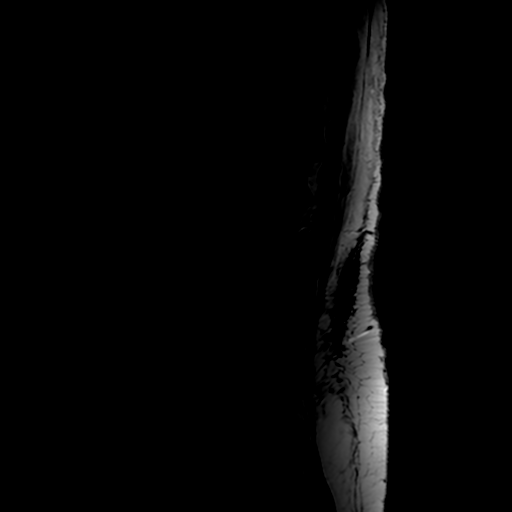
[im 22/22]
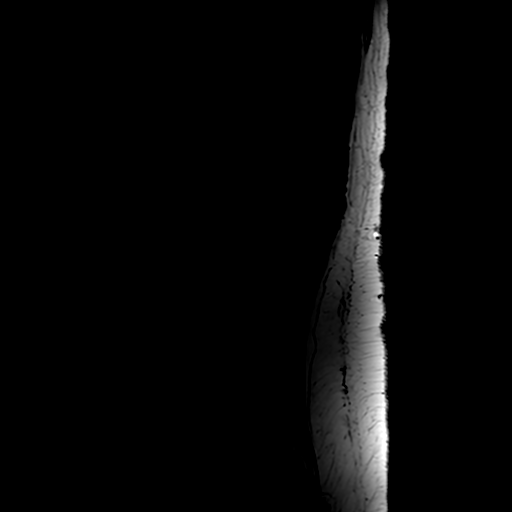

[Series 8: T2 post-contrast · sagittal · 3.0mm · 0.66mm/px · 3 of 22 slices shown (1 of 2)]
[im 1/22]
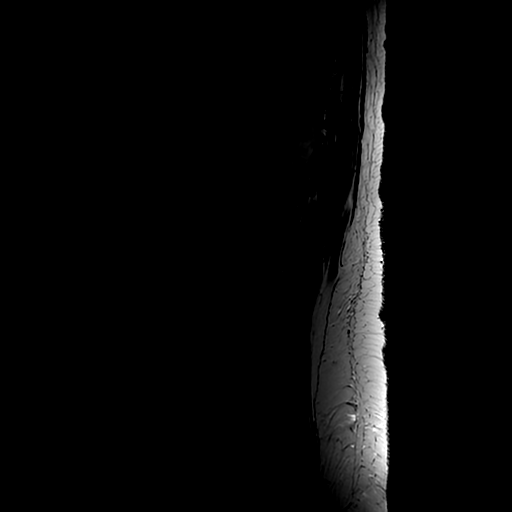
[im 11/22]
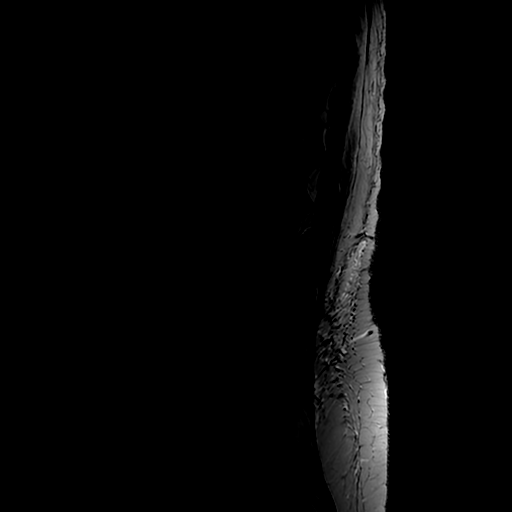
[im 22/22]
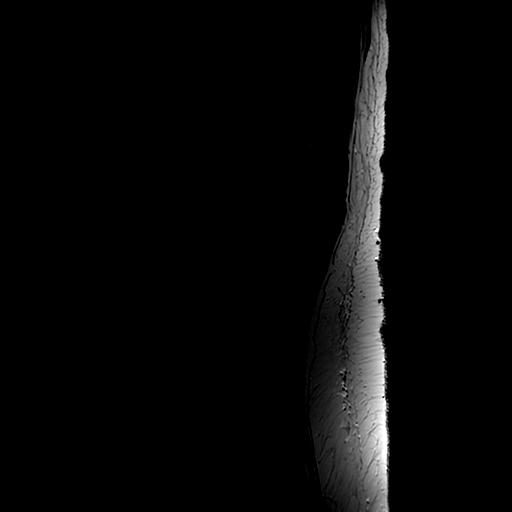

[Series 10: T2 post-contrast · sagittal · 3.0mm · 0.59mm/px · 3 of 17 slices shown (2 of 2)]
[im 1/17]
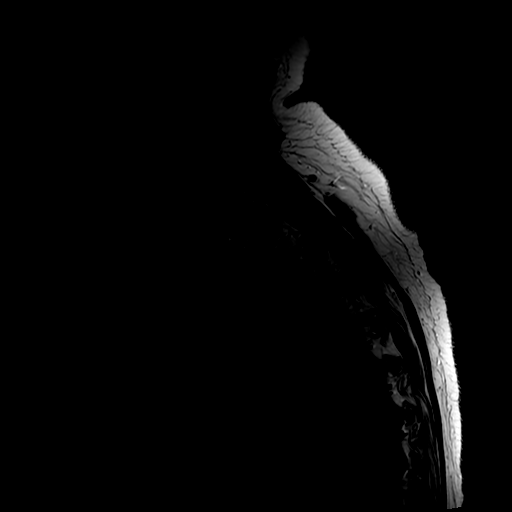
[im 9/17]
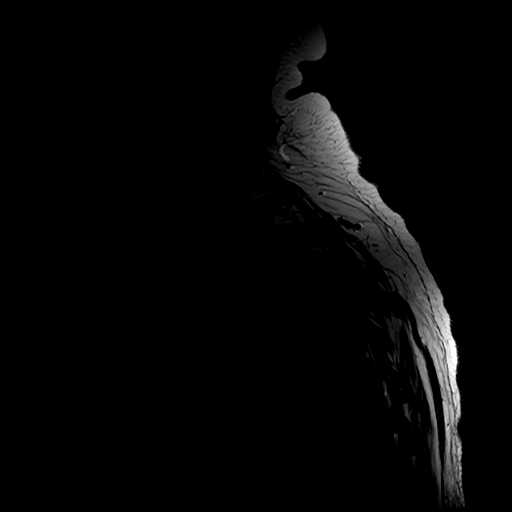
[im 17/17]
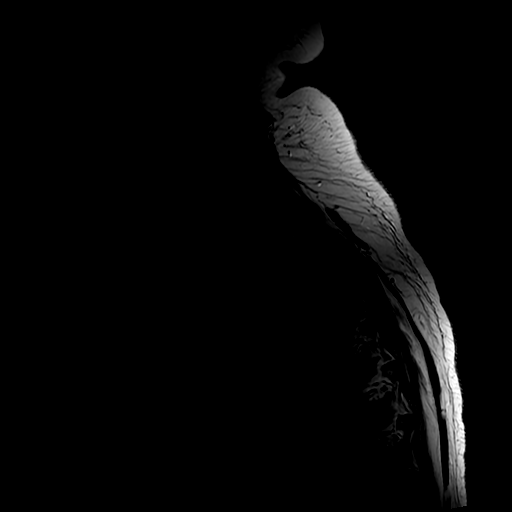

[18 of 48 positions shown; findings below may reference images not displayed]

FINDINGS: MRI CERVICAL SPINE FINDINGS

Alignment: Normal

Vertebrae: Negative for fracture or mass

Cord: Normal signal and morphology. No abnormal enhancement in the
spinal canal.

Posterior Fossa, vertebral arteries, paraspinal tissues: Negative

Disc levels:

Disc degeneration and spurring most notably at C3-4 and C5-6.
Limited evaluation for stenosis given the protocol however there
does appear to be foraminal encroachment bilaterally at C3-4 and
C5-6 due to spurring.

MRI THORACIC SPINE FINDINGS

Alignment:  Normal

Vertebrae: Negative for fracture or mass

Cord: Normal signal morphology. No enhancing nodules in the spinal
canal.

Paraspinal and other soft tissues: Small right pleural effusion. No
paraspinous soft tissue mass.

Disc levels:

Small central disc protrusions T6-7 and T7-8. Moderate left sided
disc protrusion at T10-11, without significant spinal stenosis.

MRI LUMBAR SPINE FINDINGS

Segmentation:  5 lumbar vertebra

Alignment:  Mild retrolisthesis L2-3, L3-4, L4-5

Vertebrae:  Negative for fracture or mass

Conus medullaris: Extends to the L1 level and appears normal. No
abnormal enhancement in the cauda equina.

Paraspinal and other soft tissues: Negative for soft tissue mass or
edema.

Disc levels:

Disc degeneration and spurring L1 through L5. No significant
stenosis or neural impingement. Facet degeneration is present in the
lower lumbar spine.
IMPRESSION: 1. No evidence of tumor in the spine. No enhancing nodules in the
spinal canal or cauda equina.
2. Cervical spondylosis and spurring most prominent C3-4 and C5-6
3. Lumbar spine degenerative change with small disc protrusions T6-7
and T7-8 and moderate left-sided disc protrusion T10-11
4. Lumbar spondylosis without significant spinal stenosis.

## 2021-06-23 MED ORDER — GADOBUTROL 1 MMOL/ML IV SOLN
4.0000 mL | Freq: Once | INTRAVENOUS | Status: AC | PRN
  Administered 2021-06-23: 4 mL via INTRAVENOUS

## 2021-06-24 ENCOUNTER — Inpatient Hospital Stay: Payer: BC Managed Care – PPO

## 2021-06-24 ENCOUNTER — Other Ambulatory Visit: Payer: Self-pay

## 2021-06-24 ENCOUNTER — Inpatient Hospital Stay (HOSPITAL_BASED_OUTPATIENT_CLINIC_OR_DEPARTMENT_OTHER): Payer: BC Managed Care – PPO | Admitting: Internal Medicine

## 2021-06-24 VITALS — BP 132/81 | HR 73 | Temp 97.3°F | Resp 18 | Wt 187.6 lb

## 2021-06-24 DIAGNOSIS — C711 Malignant neoplasm of frontal lobe: Secondary | ICD-10-CM | POA: Diagnosis not present

## 2021-06-24 DIAGNOSIS — C719 Malignant neoplasm of brain, unspecified: Secondary | ICD-10-CM

## 2021-06-24 DIAGNOSIS — Z7189 Other specified counseling: Secondary | ICD-10-CM

## 2021-06-24 DIAGNOSIS — Z79899 Other long term (current) drug therapy: Secondary | ICD-10-CM | POA: Diagnosis not present

## 2021-06-24 DIAGNOSIS — Y842 Radiological procedure and radiotherapy as the cause of abnormal reaction of the patient, or of later complication, without mention of misadventure at the time of the procedure: Secondary | ICD-10-CM

## 2021-06-24 DIAGNOSIS — C7951 Secondary malignant neoplasm of bone: Secondary | ICD-10-CM | POA: Diagnosis not present

## 2021-06-24 DIAGNOSIS — R569 Unspecified convulsions: Secondary | ICD-10-CM

## 2021-06-24 DIAGNOSIS — Z5112 Encounter for antineoplastic immunotherapy: Secondary | ICD-10-CM | POA: Diagnosis not present

## 2021-06-24 LAB — CMP (CANCER CENTER ONLY)
ALT: 16 U/L (ref 0–44)
AST: 13 U/L — ABNORMAL LOW (ref 15–41)
Albumin: 3.6 g/dL (ref 3.5–5.0)
Alkaline Phosphatase: 52 U/L (ref 38–126)
Anion gap: 9 (ref 5–15)
BUN: 13 mg/dL (ref 8–23)
CO2: 22 mmol/L (ref 22–32)
Calcium: 9 mg/dL (ref 8.9–10.3)
Chloride: 109 mmol/L (ref 98–111)
Creatinine: 0.72 mg/dL (ref 0.44–1.00)
GFR, Estimated: >60 mL/min (ref >60)
Glucose, Bld: 136 mg/dL — ABNORMAL HIGH (ref 70–99)
Potassium: 3.7 mmol/L (ref 3.5–5.1)
Sodium: 140 mmol/L (ref 135–145)
Total Bilirubin: 0.6 mg/dL (ref 0.3–1.2)
Total Protein: 6.5 g/dL (ref 6.5–8.1)

## 2021-06-24 LAB — CBC WITH DIFFERENTIAL (CANCER CENTER ONLY)
Abs Immature Granulocytes: 0.08 10*3/uL — ABNORMAL HIGH (ref 0.00–0.07)
Basophils Absolute: 0 10*3/uL (ref 0.0–0.1)
Basophils Relative: 0 %
Eosinophils Absolute: 0 10*3/uL (ref 0.0–0.5)
Eosinophils Relative: 0 %
HCT: 38.2 % (ref 36.0–46.0)
Hemoglobin: 13.4 g/dL (ref 12.0–15.0)
Immature Granulocytes: 1 %
Lymphocytes Relative: 5 %
Lymphs Abs: 0.5 10*3/uL — ABNORMAL LOW (ref 0.7–4.0)
MCH: 30.9 pg (ref 26.0–34.0)
MCHC: 35.1 g/dL (ref 30.0–36.0)
MCV: 88.2 fL (ref 80.0–100.0)
Monocytes Absolute: 0.3 10*3/uL (ref 0.1–1.0)
Monocytes Relative: 3 %
Neutro Abs: 10.1 10*3/uL — ABNORMAL HIGH (ref 1.7–7.7)
Neutrophils Relative %: 91 %
Platelet Count: 189 10*3/uL (ref 150–400)
RBC: 4.33 MIL/uL (ref 3.87–5.11)
RDW: 13.4 % (ref 11.5–15.5)
WBC Count: 11 10*3/uL — ABNORMAL HIGH (ref 4.0–10.5)
nRBC: 0 % (ref 0.0–0.2)

## 2021-06-24 LAB — TOTAL PROTEIN, URINE DIPSTICK: Protein, ur: NEGATIVE mg/dL

## 2021-06-24 MED ORDER — SODIUM CHLORIDE 0.9 % IV SOLN
10.0000 mg/kg | Freq: Once | INTRAVENOUS | Status: AC
  Administered 2021-06-24: 800 mg via INTRAVENOUS
  Filled 2021-06-24: qty 32

## 2021-06-24 MED ORDER — SODIUM CHLORIDE 0.9 % IV SOLN: Freq: Once | INTRAVENOUS | Status: AC

## 2021-06-24 NOTE — Progress Notes (Signed)
[facility] [place], [place] [address] [phone]   Interval Evaluation  Date of Service: 06/24/21 Patient Name: Erika Mitchell Patient MRN: [id number] Patient DOB: [birth year] Provider: Ventura Sellers, MD  Identifying Statement:  Erika Mitchell is a 66 y.o. female with right frontal glioblastoma   Oncologic History: Oncology History  Glioblastoma, IDH-wildtype ([place])  11/25/2020 Surgery   Craniotomy, resection with Dr. Zada Finders; path is c/w GBM, [facility]   12/04/2020 - 12/07/2020 Hospital Admission   Admitted for worsening left sided weakness, MRI demonstrates likely post-surgical localized inflammatory process.  Improves with corticosteroids.   01/06/2021 - 02/18/2021 Radiation Therapy   IMRT and concurrent Temodar Isidore Moos)   03/25/2021 -  Chemotherapy   Patient is on Treatment Plan : BRAIN GLIOBLASTOMA Consolidation Temozolomide Days 1-5 q28 Days       05/03/2021 -  Chemotherapy   Patient is on Treatment Plan : BRAIN GBM Bevacizumab 14d x 6 cycles        Biomarkers:  MGMT Unknown.  IDH 1/2 Wild type.  EGFR Unknown  TERT Unknown   Interval History: Erika Mitchell presents today for avastin treatment.  Now complete with cycle #2 of 5-day Temodar.  She describes no improvement in functional status since increasing the decadron to 8mg  daily.  Still doing very little on her own, very tired and sleepy during the day, not remembering conversations.  One afternoon she woke up from a nap very confused and disoriented.  Right arm remains weak, left is working "a little better".  She also reports some right lower back pain, urinary urgency today.  Decadron 03/18/21: 8mg  03/25/21: 4mg  05/03/21: 12mg , taper 05/20/21:   H+P (12/17/20) Patient presented on 11/23/20 with several episodes of sudden dysfunction of left arm and leg, lasting several minutes, with most recent episode accompanied by frank speech arrest while at  work.  First episode was less than one week ago.  Between, she has been normal, at baseline.  CNS imaging demonstrated enhancing mass in the right frontal lobe.  She underwent craniotomy, resection with Dr. Zada Finders on 11/25/20, path demonstrated gliolblastoma.  On 10/28, she returned to the hospital with several days of progressive left sided weakness.  After improvement back to baseline with steroids, she was discharged to home.  She has no new complaints currently, is scheduled for repeat MRI brain next week prior to radiation therapy.    Medications: Current Outpatient Medications on File Prior to Visit  Medication Sig Dispense Refill   acetaminophen (TYLENOL) 500 MG tablet Take 500 mg by mouth every 6 (six) hours as needed for moderate pain.     ALPRAZolam (XANAX) 0.25 MG tablet Take 4.5 tablets (1.125 mg total) by mouth daily as needed (Vestibular neuritis). Patient states she takes 4 of the 0.25 mg tablets and then splits one 0.25 mg tablet to make total of 1.125 mg daily for vestibular neuritis     dexamethasone (DECADRON) 4 MG tablet Take 1 tablet (4 mg total) by mouth daily. 30 tablet 1   levETIRAcetam (KEPPRA) 1000 MG tablet TAKE 1 TABLET BY MOUTH TWICE DAILY 60 tablet 3   loperamide (IMODIUM) 2 MG capsule Take 1 capsule (2 mg total) by mouth 3 (three) times daily as needed for diarrhea or loose stools. 30 capsule 0   ondansetron (ZOFRAN) 8 MG tablet Take 1 tablet (8 mg total) by mouth 2 (two) times daily as needed (nausea and vomiting). May take 30-60 minutes prior  to Temodar administration if nausea/vomiting occurs. 30 tablet 1   telmisartan (MICARDIS) 40 MG tablet Take 40 mg by mouth every evening.     No current facility-administered medications on file prior to visit.    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   Vertigo    Vestibular neuritis    right ear   Past Surgical History:  Past Surgical History:  Procedure Laterality Date   APPLICATION OF CRANIAL NAVIGATION Right 11/25/2020   Procedure: APPLICATION OF CRANIAL NAVIGATION;  Surgeon: Judith Part, MD;  Location: [facility];  Service: Neurosurgery;  Laterality: Right;   CHOLECYSTECTOMY  2017   CRANIOTOMY Right 11/25/2020   Procedure: CRANIOTOMY FOR  TUMOR RESECTION WITH Lucky Rathke;  Surgeon: Judith Part, MD;  Location: [facility];  Service: Neurosurgery;  Laterality: Right;   laproscopy     surgery for endometriosisi   OOPHORECTOMY  1974&1990   rt then left ovary   Social History:  Social History   Socioeconomic History   Marital status: Married    Spouse name: [name]   Number of children: 1   Years of education: 14   Highest education level: Not on file  Occupational History    Employer: [employer]  Tobacco Use   Smoking status: Former    Types: Cigarettes    Quit date: 02/08/1996    Years since quitting: 25.3   Smokeless tobacco: Never  Vaping Use   Vaping Use: Never used  Substance and Sexual Activity   Alcohol use: Not Currently    Alcohol/week: 2.0 standard drinks    Types: 2 Glasses of wine per week   Drug use: No   Sexual activity: Not Currently  Other Topics Concern   Not on file  Social History Narrative   Patient is married [name]).     Patient is left-handed.   Patient is working full-time.   Patient has a college education (Associate's)   Patient has one child.   Patient drinks 3 Cups of coffee daily.   Social Determinants of Health   Financial Resource Strain: Not on file  Food  Insecurity: Not on file  Transportation Needs: Not on file  Physical Activity: Not on file  Stress: Not on file  Social Connections: Not on file  Intimate Partner Violence: Not on file   Family History:  Family History  Problem Relation Age of Onset   Lung cancer Mother    Aneurysm Father    Heart disease Sister    Atrial fibrillation Brother    Colon cancer Neg Hx     Review of Systems: Constitutional: Doesn't report fevers, chills or abnormal weight loss Eyes: Doesn't report blurriness of vision Ears, nose, mouth, throat, and face: Doesn't report sore throat Respiratory: Doesn't report cough, dyspnea or wheezes Cardiovascular: Doesn't report palpitation, chest discomfort  Gastrointestinal:  Doesn't report nausea, constipation, diarrhea GU: Doesn't report incontinence Skin: Doesn't report skin rashes Neurological: Per HPI Musculoskeletal:  Doesn't report joint pain Behavioral/Psych: Doesn't report anxiety  Physical Exam: Vitals:   06/24/21 1335  BP: 132/81  Pulse: 73  Resp: 18  Temp: (!) 97.3 F (36.3 C)  SpO2: 98%   KPS: 60. General: Alert, cooperative, pleasant, in no acute distress Head: Normal EENT: No conjunctival injection or scleral icterus.  Lungs: Resp effort normal Cardiac: Regular rate Abdomen: Non-distended abdomen Skin: No rashes cyanosis or petechiae. Extremities: No clubbing or edema  Neurologic Exam: Mental Status: Awake, alert, attentive to examiner. Oriented to self and environment. Language is fluent with intact comprehension.  Cranial Nerves: Visual acuity is grossly normal. Visual fields are full. Extra-ocular movements intact. No ptosis. Face is symmetric Motor: Tone and bulk are normal. 4-/5 in left arm and handm, 4+/5 in left leg. Right arm drift noted today 4/5. Reflexes are symmetric, no pathologic reflexes present.  Sensory: Intact to light touch Gait: Hemiparetic   Labs: I have reviewed the data as listed    Component Value  Date/Time   NA 142 06/17/2021 1107   K 3.4 (L) 06/17/2021 1107   CL 109 06/17/2021 1107   CO2 25 06/17/2021 1107   GLUCOSE 112 (H) 06/17/2021 1107   BUN 13 06/17/2021 1107   CREATININE 0.75 06/17/2021 1107   CALCIUM 9.2 06/17/2021 1107   PROT 6.6 06/17/2021 1107   ALBUMIN 3.9 06/17/2021 1107   AST 11 (L) 06/17/2021 1107   ALT 15 06/17/2021 1107   ALKPHOS 46 06/17/2021 1107   BILITOT 0.5 06/17/2021 1107   GFRNONAA >60 06/17/2021 1107   Lab Results  Component Value Date   WBC 11.0 (H) 06/24/2021   NEUTROABS 10.1 (H) 06/24/2021   HGB 13.4 06/24/2021   HCT 38.2 06/24/2021   MCV 88.2 06/24/2021   PLT 189 06/24/2021    Imaging:  [place] Clinician Interpretation: I have personally reviewed the CNS images as listed.  My interpretation, in the context of the patient's clinical presentation, is stable disease  MR Brain W Wo Contrast  Result Date: 06/18/2021 CLINICAL DATA:  Right frontal glioblastoma, craniotomy 11/26/2020, radiation completed 02/18/2021 EXAM: MRI HEAD WITHOUT AND WITH CONTRAST TECHNIQUE: Multiplanar, multiecho pulse sequences of the brain and surrounding structures were obtained without and with intravenous contrast. CONTRAST:  7.63mL GADAVIST GADOBUTROL 1 MMOL/ML IV SOLN COMPARISON:  04/25/2021, 03/12/2021 FINDINGS: Comparison to the prior exam is somewhat limited by motion artifact on that study. Brain: Status post right frontoparietal craniotomy with subjacent resection cavity in the right frontal lobe. Interval decreased enhancement in this area, now measuring approximately 13 x 25 x 9 mm (AP x TR x CC) (series 16, image 139 and series 18, image 19), previously up to 33 x 35 x 38 mm when remeasured similarly on 03/12/2021. Faint enhancement is now noted in a adjacent focus in the right frontal lobe, which measures 4 mm (series 16, image 118), unchanged in size but significantly decreased in enhancement. Additional enhancing foci along the body of the corpus callosum and in the  right frontal lobe noted on the 03/12/2021 exam are also no longer seen. Decreased associated T2 hyperintense signal. Encephalomalacia with faint areas of enhancement in the left parasagittal frontal lobe and along the left lateral ventricle (series 16, image 111), where previously there were several subcentimeter enhancing foci, with the largest residual enhancing focus measuring up to 3 mm (series 16, image 111), previously 5 mm. A more medial enhancing focus now measures 9 x 3 x 3 mm (series 16, image 119 and series 18, image  109), previously 9 x 10 x 5 mm when remeasured similarly. Surrounding T2 hyperintense signal appears slightly increased compared to the prior exam (series 11, image 36). Reduced mass effect on the right lateral ventricle, with resolution of approximately 5 mm of previous right-to-left midline shift. Hydrocephalus or extra-axial collection. Vascular: Normal arterial flow voids. Venous sinuses are patent on postcontrast imaging. Skull and upper cervical spine: Right frontoparietal craniotomy. Otherwise normal marrow signal. Sinuses/Orbits: Negative. Other: None. IMPRESSION: 1. Given motion on the 04/25/2021 exam, comparison primarily made with 03/12/2021. Within this limitation, decreased enhancement is noted about the right frontal resection cavity, with associated decreased T2 hyperintense signal, favored to represent treatment response. In addition there is decreased mass effect on the right lateral ventricle and resolution of previous midline shift. 2. In the left frontal lobe, there is an area of encephalomalacia with faint enhancement, where previously there were several subcentimeter enhancing foci, with an additional slightly more medial lesion also decreased in size, which may represent treatment response; however there is slightly increased associated T2 hyperintense signal, which is nonspecific and could be related to treatment but may represent nonenhancing tumor. Electronically  Signed   By: Merilyn Baba M.D.   On: 06/18/2021 17:39   MR TOTAL SPINE METS SCREENING  Result Date: 06/23/2021 CLINICAL DATA:  Glioblastoma staging.  Back pain, weakness EXAM: MRI TOTAL SPINE WITHOUT AND WITH CONTRAST TECHNIQUE: Multisequence MR imaging of the spine from the cervical spine to the sacrum was performed prior to and following IV contrast administration for evaluation of spinal metastatic disease. CONTRAST:  47mL GADAVIST GADOBUTROL 1 MMOL/ML IV SOLN COMPARISON:  None Available. FINDINGS: MRI CERVICAL SPINE FINDINGS Alignment: Normal Vertebrae: Negative for fracture or mass Cord: Normal signal and morphology. No abnormal enhancement in the spinal canal. Posterior Fossa, vertebral arteries, paraspinal tissues: Negative Disc levels: Disc degeneration and spurring most notably at C3-4 and C5-6. Limited evaluation for stenosis given the protocol however there does appear to be foraminal encroachment bilaterally at C3-4 and C5-6 due to spurring. MRI THORACIC SPINE FINDINGS Alignment:  Normal Vertebrae: Negative for fracture or mass Cord: Normal signal morphology. No enhancing nodules in the spinal canal. Paraspinal and other soft tissues: Small right pleural effusion. No paraspinous soft tissue mass. Disc levels: Small central disc protrusions T6-7 and T7-8. Moderate left sided disc protrusion at T10-11, without significant spinal stenosis. MRI LUMBAR SPINE FINDINGS Segmentation:  5 lumbar vertebra Alignment:  Mild retrolisthesis L2-3, L3-4, L4-5 Vertebrae:  Negative for fracture or mass Conus medullaris: Extends to the L1 level and appears normal. No abnormal enhancement in the cauda equina. Paraspinal and other soft tissues: Negative for soft tissue mass or edema. Disc levels: Disc degeneration and spurring L1 through L5. No significant stenosis or neural impingement. Facet degeneration is present in the lower lumbar spine. IMPRESSION: 1. No evidence of tumor in the spine. No enhancing nodules in the  spinal canal or cauda equina. 2. Cervical spondylosis and spurring most prominent C3-4 and C5-6 3. Lumbar spine degenerative change with small disc protrusions T6-7 and T7-8 and moderate left-sided disc protrusion T10-11 4. Lumbar spondylosis without significant spinal stenosis. Electronically Signed   By: Franchot Gallo M.D.   On: 06/23/2021 11:51    Assessment/Plan Glioblastoma, IDH-wildtype ([place])  Seizures ([place])  Benedict Needy is clinically not improved today following boost in steroid dose, further workup.  MRI brain demonstrates improvement in volume of enhancing disease, mass effect, T2 signal burden.  There is noted residual leukomalacia predominantly within right  frontal lobe likely as result of radiation.  This may account for cognitive and systemic symptoms.    Total spine mets MRI did not demonstrate leptomeningeal dissemination.  It did show arthritic, spondolytic regions, particularly in the upper cervical spine.  These could account for right arm weakness, back pain, urinary urgency.  She will defer evaluation with neurosurgery at this time.  She elected to proceed with avastin infusion today.  She is unsure if she wants to continue chemotherapy, or aggressive measure more broadly.    Decadron should decrease to $RemoveBef'2mg'wRxFgwcaCW$  daily if tolerated.  For seizures, Keppra should maintain $RemoveBeforeD'1000mg'DrOQjRxdtNFmId$  BID if tolerated.  We ask that MARYCRUZ BOEHNER return to clinic in in 2 weeks for avastin treatment or further goals of care discussion.  All questions were answered. The patient knows to call the clinic with any problems, questions or concerns. No barriers to learning were detected.  The total time spent in the encounter was 40 minutes and more than 50% was on counseling and review of test results   Ventura Sellers, MD Medical Director of Neuro-Oncology [facility] [place] 06/24/21 1:34 PM

## 2021-06-24 NOTE — Patient Instructions (Signed)
[facility] MEDICAL ONCOLOGY  Discharge Instructions: °Thank you for choosing [facility] to provide your oncology and hematology care.  ° °If you have a lab appointment with the Cancer Center, please go directly to the Cancer Center and check in at the registration area. °  °Wear comfortable clothing and clothing appropriate for easy access to any Portacath or PICC line.  ° °We strive to give you quality time with your provider. You may need to reschedule your appointment if you arrive late (15 or more minutes).  Arriving late affects you and other patients whose appointments are after yours.  Also, if you miss three or more appointments without notifying the office, you may be dismissed from the clinic at the provider’s discretion.    °  °For prescription refill requests, have your pharmacy contact our office and allow 72 hours for refills to be completed.   ° °Today you received the following chemotherapy and/or immunotherapy agents: bevacizumab    °  °To help prevent nausea and vomiting after your treatment, we encourage you to take your nausea medication as directed. ° °BELOW ARE SYMPTOMS THAT SHOULD BE REPORTED IMMEDIATELY: °*FEVER GREATER THAN 100.4 F (38 °C) OR HIGHER °*CHILLS OR SWEATING °*NAUSEA AND VOMITING THAT IS NOT CONTROLLED WITH YOUR NAUSEA MEDICATION °*UNUSUAL SHORTNESS OF BREATH °*UNUSUAL BRUISING OR BLEEDING °*URINARY PROBLEMS (pain or burning when urinating, or frequent urination) °*BOWEL PROBLEMS (unusual diarrhea, constipation, pain near the anus) °TENDERNESS IN MOUTH AND THROAT WITH OR WITHOUT PRESENCE OF ULCERS (sore throat, sores in mouth, or a toothache) °UNUSUAL RASH, SWELLING OR PAIN  °UNUSUAL VAGINAL DISCHARGE OR ITCHING  ° °Items with * indicate a potential emergency and should be followed up as soon as possible or go to the Emergency Department if any problems should occur. ° °Please show the CHEMOTHERAPY ALERT CARD or IMMUNOTHERAPY ALERT CARD at check-in  to the Emergency Department and triage nurse. ° °Should you have questions after your visit or need to cancel or reschedule your appointment, please contact [facility]  Dept: [phone]  and follow the prompts.  Office hours are 8:00 a.m. to 4:30 p.m. Monday - Friday. Please note that voicemails left after 4:00 p.m. may not be returned until the following business day.  We are closed weekends and major holidays. You have access to a nurse at all times for urgent questions. Please call the main number to the clinic Dept: [phone] and follow the prompts. ° ° °For any non-urgent questions, you may also contact your provider using MyChart. We now offer e-Visits for anyone 18 and older to request care online for non-urgent symptoms. For details visit [email or web address]. °  °Also download the MyChart app! Go to the app store, search "MyChart", open the app, select [facility], and log in with your MyChart username and password. ° °Due to Covid, a mask is required upon entering the hospital/clinic. If you do not have a mask, one will be given to you upon arrival. For doctor visits, patients may have 1 support person aged 18 or older with them. For treatment visits, patients cannot have anyone with them due to current Covid guidelines and our immunocompromised population.  ° °

## 2021-06-25 ENCOUNTER — Other Ambulatory Visit: Payer: BC Managed Care – PPO

## 2021-06-27 ENCOUNTER — Encounter: Payer: Self-pay | Admitting: Podiatry

## 2021-06-27 NOTE — Progress Notes (Signed)
  Subjective:  Patient ID: Erika Mitchell, female    DOB: [birth year],  MRN: [id number]  Chief Complaint  Patient presents with   Nail Problem       for nail re-check    66 y.o. female presents with the above complaint. History confirmed with patient.  Doing much better the remaining nails are thickened elongated and causing discomfort in her shoes. Objective:  Physical Exam: warm, good capillary refill, no trophic changes or ulcerative lesions, normal DP and PT pulses, and normal sensory exam.  Thickened elongated nails with discoloration and subungual debris  Right Foot: Matricectomy site healing well  Assessment:   1. Paronychia of great toe of right foot   2. Pain due to onychomycosis of toenails of both feet      Plan:  Patient was evaluated and treated and all questions answered.    Ingrown Nail, left -Matricectomy site is healing well she can discontinue soaks and ointments at this point   Discussed the etiology and treatment options for the condition in detail with the patient. Educated patient on the topical and oral treatment options for mycotic nails. Recommended debridement of the nails today. Sharp and mechanical debridement performed of all painful and mycotic nails today. Nails debrided in length and thickness using a nail nipper to level of comfort. Discussed treatment options including appropriate shoe gear. Follow up as needed for painful nails.   Return in about 3 months (around 09/22/2021) for painful thickened nails.

## 2021-06-28 ENCOUNTER — Other Ambulatory Visit (HOSPITAL_COMMUNITY): Payer: Self-pay

## 2021-06-28 ENCOUNTER — Inpatient Hospital Stay: Payer: BC Managed Care – PPO

## 2021-06-28 ENCOUNTER — Other Ambulatory Visit: Payer: Self-pay | Admitting: Internal Medicine

## 2021-06-28 MED ORDER — DEXAMETHASONE 2 MG PO TABS: 2.0000 mg | ORAL_TABLET | Freq: Every day | ORAL | 1 refills | Status: AC

## 2021-07-01 ENCOUNTER — Other Ambulatory Visit: Payer: BC Managed Care – PPO

## 2021-07-01 ENCOUNTER — Ambulatory Visit: Payer: BC Managed Care – PPO | Admitting: Internal Medicine

## 2021-07-01 ENCOUNTER — Ambulatory Visit: Payer: BC Managed Care – PPO

## 2021-07-06 ENCOUNTER — Telehealth: Payer: Self-pay | Admitting: *Deleted

## 2021-07-06 DIAGNOSIS — I10 Essential (primary) hypertension: Secondary | ICD-10-CM | POA: Diagnosis not present

## 2021-07-06 DIAGNOSIS — G40909 Epilepsy, unspecified, not intractable, without status epilepticus: Secondary | ICD-10-CM | POA: Diagnosis not present

## 2021-07-06 DIAGNOSIS — Z7401 Bed confinement status: Secondary | ICD-10-CM | POA: Diagnosis not present

## 2021-07-06 DIAGNOSIS — Z87891 Personal history of nicotine dependence: Secondary | ICD-10-CM | POA: Diagnosis not present

## 2021-07-06 DIAGNOSIS — Z741 Need for assistance with personal care: Secondary | ICD-10-CM | POA: Diagnosis not present

## 2021-07-06 DIAGNOSIS — C711 Malignant neoplasm of frontal lobe: Secondary | ICD-10-CM | POA: Diagnosis not present

## 2021-07-06 DIAGNOSIS — R42 Dizziness and giddiness: Secondary | ICD-10-CM | POA: Diagnosis not present

## 2021-07-06 DIAGNOSIS — R159 Full incontinence of feces: Secondary | ICD-10-CM | POA: Diagnosis not present

## 2021-07-06 DIAGNOSIS — C44609 Unspecified malignant neoplasm of skin of left upper limb, including shoulder: Secondary | ICD-10-CM | POA: Diagnosis not present

## 2021-07-06 DIAGNOSIS — R32 Unspecified urinary incontinence: Secondary | ICD-10-CM | POA: Diagnosis not present

## 2021-07-06 DIAGNOSIS — C719 Malignant neoplasm of brain, unspecified: Secondary | ICD-10-CM

## 2021-07-06 DIAGNOSIS — C44309 Unspecified malignant neoplasm of skin of other parts of face: Secondary | ICD-10-CM | POA: Diagnosis not present

## 2021-07-06 NOTE — Telephone Encounter (Signed)
Patients spouse called to report that patient was getting much weaker on the right side and her left arm was trembling more.  States she is now unable to walk and is not eating much.  Reports increased confusion.    They have decided to stop the Avastin treatments and proceed with Hospice.  Provider made aware and referral called.

## 2021-07-07 DIAGNOSIS — R159 Full incontinence of feces: Secondary | ICD-10-CM | POA: Diagnosis not present

## 2021-07-07 DIAGNOSIS — C711 Malignant neoplasm of frontal lobe: Secondary | ICD-10-CM | POA: Diagnosis not present

## 2021-07-07 DIAGNOSIS — R32 Unspecified urinary incontinence: Secondary | ICD-10-CM | POA: Diagnosis not present

## 2021-07-07 DIAGNOSIS — Z87891 Personal history of nicotine dependence: Secondary | ICD-10-CM | POA: Diagnosis not present

## 2021-07-07 DIAGNOSIS — Z7401 Bed confinement status: Secondary | ICD-10-CM | POA: Diagnosis not present

## 2021-07-07 DIAGNOSIS — Z741 Need for assistance with personal care: Secondary | ICD-10-CM | POA: Diagnosis not present

## 2021-07-07 DIAGNOSIS — R42 Dizziness and giddiness: Secondary | ICD-10-CM | POA: Diagnosis not present

## 2021-07-07 DIAGNOSIS — C44309 Unspecified malignant neoplasm of skin of other parts of face: Secondary | ICD-10-CM | POA: Diagnosis not present

## 2021-07-07 DIAGNOSIS — I10 Essential (primary) hypertension: Secondary | ICD-10-CM | POA: Diagnosis not present

## 2021-07-07 DIAGNOSIS — G40909 Epilepsy, unspecified, not intractable, without status epilepticus: Secondary | ICD-10-CM | POA: Diagnosis not present

## 2021-07-07 DIAGNOSIS — C44609 Unspecified malignant neoplasm of skin of left upper limb, including shoulder: Secondary | ICD-10-CM | POA: Diagnosis not present

## 2021-07-08 ENCOUNTER — Telehealth: Payer: Self-pay | Admitting: *Deleted

## 2021-07-08 ENCOUNTER — Other Ambulatory Visit: Payer: BC Managed Care – PPO

## 2021-07-08 ENCOUNTER — Ambulatory Visit: Payer: BC Managed Care – PPO

## 2021-07-08 ENCOUNTER — Ambulatory Visit: Payer: BC Managed Care – PPO | Admitting: Internal Medicine

## 2021-07-08 DIAGNOSIS — R32 Unspecified urinary incontinence: Secondary | ICD-10-CM | POA: Diagnosis not present

## 2021-07-08 DIAGNOSIS — Z741 Need for assistance with personal care: Secondary | ICD-10-CM | POA: Diagnosis not present

## 2021-07-08 DIAGNOSIS — C44309 Unspecified malignant neoplasm of skin of other parts of face: Secondary | ICD-10-CM | POA: Diagnosis not present

## 2021-07-08 DIAGNOSIS — G40909 Epilepsy, unspecified, not intractable, without status epilepticus: Secondary | ICD-10-CM | POA: Diagnosis not present

## 2021-07-08 DIAGNOSIS — R42 Dizziness and giddiness: Secondary | ICD-10-CM | POA: Diagnosis not present

## 2021-07-08 DIAGNOSIS — I10 Essential (primary) hypertension: Secondary | ICD-10-CM | POA: Diagnosis not present

## 2021-07-08 DIAGNOSIS — C711 Malignant neoplasm of frontal lobe: Secondary | ICD-10-CM | POA: Diagnosis not present

## 2021-07-08 DIAGNOSIS — Z87891 Personal history of nicotine dependence: Secondary | ICD-10-CM | POA: Diagnosis not present

## 2021-07-08 DIAGNOSIS — C44609 Unspecified malignant neoplasm of skin of left upper limb, including shoulder: Secondary | ICD-10-CM | POA: Diagnosis not present

## 2021-07-08 DIAGNOSIS — R159 Full incontinence of feces: Secondary | ICD-10-CM | POA: Diagnosis not present

## 2021-07-08 DIAGNOSIS — Z7401 Bed confinement status: Secondary | ICD-10-CM | POA: Diagnosis not present

## 2021-07-09 ENCOUNTER — Telehealth: Payer: Self-pay

## 2021-07-09 DIAGNOSIS — C44609 Unspecified malignant neoplasm of skin of left upper limb, including shoulder: Secondary | ICD-10-CM | POA: Diagnosis not present

## 2021-07-09 DIAGNOSIS — R159 Full incontinence of feces: Secondary | ICD-10-CM | POA: Diagnosis not present

## 2021-07-09 DIAGNOSIS — G40909 Epilepsy, unspecified, not intractable, without status epilepticus: Secondary | ICD-10-CM | POA: Diagnosis not present

## 2021-07-09 DIAGNOSIS — Z87891 Personal history of nicotine dependence: Secondary | ICD-10-CM | POA: Diagnosis not present

## 2021-07-09 DIAGNOSIS — R42 Dizziness and giddiness: Secondary | ICD-10-CM | POA: Diagnosis not present

## 2021-07-09 DIAGNOSIS — R32 Unspecified urinary incontinence: Secondary | ICD-10-CM | POA: Diagnosis not present

## 2021-07-09 DIAGNOSIS — Z741 Need for assistance with personal care: Secondary | ICD-10-CM | POA: Diagnosis not present

## 2021-07-09 DIAGNOSIS — I10 Essential (primary) hypertension: Secondary | ICD-10-CM | POA: Diagnosis not present

## 2021-07-09 DIAGNOSIS — C44309 Unspecified malignant neoplasm of skin of other parts of face: Secondary | ICD-10-CM | POA: Diagnosis not present

## 2021-07-09 DIAGNOSIS — C711 Malignant neoplasm of frontal lobe: Secondary | ICD-10-CM | POA: Diagnosis not present

## 2021-07-09 DIAGNOSIS — Z7401 Bed confinement status: Secondary | ICD-10-CM | POA: Diagnosis not present

## 2021-07-09 NOTE — Telephone Encounter (Signed)
This nurse received a call from [place], stating that patient is restless at night.  She is unable to sleep and is constantly getting up and down.  Patient has Valium '5mg'$  tabs to use when she has scans and nurse would like to know if provider would like the patient to take Valium or would he like to initiate the standing order for Ativan 0.'5mg'$ .  Request forwarded to provider for advisement.

## 2021-07-09 NOTE — Telephone Encounter (Signed)
This nurse spoke reached out to [place] at [place] and made aware per provider that patient can use the Valium at night to help her rest.  She stated she will let the patients spouse know and will let us know if that is effective.  No further questions or concerns at this time.

## 2021-07-10 DIAGNOSIS — Z7401 Bed confinement status: Secondary | ICD-10-CM | POA: Diagnosis not present

## 2021-07-10 DIAGNOSIS — C44309 Unspecified malignant neoplasm of skin of other parts of face: Secondary | ICD-10-CM | POA: Diagnosis not present

## 2021-07-10 DIAGNOSIS — R159 Full incontinence of feces: Secondary | ICD-10-CM | POA: Diagnosis not present

## 2021-07-10 DIAGNOSIS — Z87891 Personal history of nicotine dependence: Secondary | ICD-10-CM | POA: Diagnosis not present

## 2021-07-10 DIAGNOSIS — I10 Essential (primary) hypertension: Secondary | ICD-10-CM | POA: Diagnosis not present

## 2021-07-10 DIAGNOSIS — G40909 Epilepsy, unspecified, not intractable, without status epilepticus: Secondary | ICD-10-CM | POA: Diagnosis not present

## 2021-07-10 DIAGNOSIS — C711 Malignant neoplasm of frontal lobe: Secondary | ICD-10-CM | POA: Diagnosis not present

## 2021-07-10 DIAGNOSIS — R32 Unspecified urinary incontinence: Secondary | ICD-10-CM | POA: Diagnosis not present

## 2021-07-10 DIAGNOSIS — C44609 Unspecified malignant neoplasm of skin of left upper limb, including shoulder: Secondary | ICD-10-CM | POA: Diagnosis not present

## 2021-07-10 DIAGNOSIS — R42 Dizziness and giddiness: Secondary | ICD-10-CM | POA: Diagnosis not present

## 2021-07-10 DIAGNOSIS — Z741 Need for assistance with personal care: Secondary | ICD-10-CM | POA: Diagnosis not present

## 2021-07-11 DIAGNOSIS — R159 Full incontinence of feces: Secondary | ICD-10-CM | POA: Diagnosis not present

## 2021-07-11 DIAGNOSIS — R42 Dizziness and giddiness: Secondary | ICD-10-CM | POA: Diagnosis not present

## 2021-07-11 DIAGNOSIS — Z741 Need for assistance with personal care: Secondary | ICD-10-CM | POA: Diagnosis not present

## 2021-07-11 DIAGNOSIS — C44309 Unspecified malignant neoplasm of skin of other parts of face: Secondary | ICD-10-CM | POA: Diagnosis not present

## 2021-07-11 DIAGNOSIS — R32 Unspecified urinary incontinence: Secondary | ICD-10-CM | POA: Diagnosis not present

## 2021-07-11 DIAGNOSIS — I10 Essential (primary) hypertension: Secondary | ICD-10-CM | POA: Diagnosis not present

## 2021-07-11 DIAGNOSIS — C711 Malignant neoplasm of frontal lobe: Secondary | ICD-10-CM | POA: Diagnosis not present

## 2021-07-11 DIAGNOSIS — Z87891 Personal history of nicotine dependence: Secondary | ICD-10-CM | POA: Diagnosis not present

## 2021-07-11 DIAGNOSIS — C44609 Unspecified malignant neoplasm of skin of left upper limb, including shoulder: Secondary | ICD-10-CM | POA: Diagnosis not present

## 2021-07-11 DIAGNOSIS — Z7401 Bed confinement status: Secondary | ICD-10-CM | POA: Diagnosis not present

## 2021-07-11 DIAGNOSIS — G40909 Epilepsy, unspecified, not intractable, without status epilepticus: Secondary | ICD-10-CM | POA: Diagnosis not present

## 2021-07-12 DIAGNOSIS — C44609 Unspecified malignant neoplasm of skin of left upper limb, including shoulder: Secondary | ICD-10-CM | POA: Diagnosis not present

## 2021-07-12 DIAGNOSIS — Z741 Need for assistance with personal care: Secondary | ICD-10-CM | POA: Diagnosis not present

## 2021-07-12 DIAGNOSIS — R159 Full incontinence of feces: Secondary | ICD-10-CM | POA: Diagnosis not present

## 2021-07-12 DIAGNOSIS — R42 Dizziness and giddiness: Secondary | ICD-10-CM | POA: Diagnosis not present

## 2021-07-12 DIAGNOSIS — C711 Malignant neoplasm of frontal lobe: Secondary | ICD-10-CM | POA: Diagnosis not present

## 2021-07-12 DIAGNOSIS — I10 Essential (primary) hypertension: Secondary | ICD-10-CM | POA: Diagnosis not present

## 2021-07-12 DIAGNOSIS — Z87891 Personal history of nicotine dependence: Secondary | ICD-10-CM | POA: Diagnosis not present

## 2021-07-12 DIAGNOSIS — C44309 Unspecified malignant neoplasm of skin of other parts of face: Secondary | ICD-10-CM | POA: Diagnosis not present

## 2021-07-12 DIAGNOSIS — G40909 Epilepsy, unspecified, not intractable, without status epilepticus: Secondary | ICD-10-CM | POA: Diagnosis not present

## 2021-07-12 DIAGNOSIS — R32 Unspecified urinary incontinence: Secondary | ICD-10-CM | POA: Diagnosis not present

## 2021-07-12 DIAGNOSIS — Z7401 Bed confinement status: Secondary | ICD-10-CM | POA: Diagnosis not present

## 2021-07-13 ENCOUNTER — Other Ambulatory Visit (HOSPITAL_COMMUNITY): Payer: Self-pay

## 2021-07-13 DIAGNOSIS — I10 Essential (primary) hypertension: Secondary | ICD-10-CM | POA: Diagnosis not present

## 2021-07-13 DIAGNOSIS — C711 Malignant neoplasm of frontal lobe: Secondary | ICD-10-CM | POA: Diagnosis not present

## 2021-07-13 DIAGNOSIS — Z741 Need for assistance with personal care: Secondary | ICD-10-CM | POA: Diagnosis not present

## 2021-07-13 DIAGNOSIS — G40909 Epilepsy, unspecified, not intractable, without status epilepticus: Secondary | ICD-10-CM | POA: Diagnosis not present

## 2021-07-13 DIAGNOSIS — R32 Unspecified urinary incontinence: Secondary | ICD-10-CM | POA: Diagnosis not present

## 2021-07-13 DIAGNOSIS — Z7401 Bed confinement status: Secondary | ICD-10-CM | POA: Diagnosis not present

## 2021-07-13 DIAGNOSIS — C44609 Unspecified malignant neoplasm of skin of left upper limb, including shoulder: Secondary | ICD-10-CM | POA: Diagnosis not present

## 2021-07-13 DIAGNOSIS — R159 Full incontinence of feces: Secondary | ICD-10-CM | POA: Diagnosis not present

## 2021-07-13 DIAGNOSIS — C44309 Unspecified malignant neoplasm of skin of other parts of face: Secondary | ICD-10-CM | POA: Diagnosis not present

## 2021-07-13 DIAGNOSIS — Z87891 Personal history of nicotine dependence: Secondary | ICD-10-CM | POA: Diagnosis not present

## 2021-07-13 DIAGNOSIS — R42 Dizziness and giddiness: Secondary | ICD-10-CM | POA: Diagnosis not present

## 2021-07-14 DIAGNOSIS — R42 Dizziness and giddiness: Secondary | ICD-10-CM | POA: Diagnosis not present

## 2021-07-14 DIAGNOSIS — R159 Full incontinence of feces: Secondary | ICD-10-CM | POA: Diagnosis not present

## 2021-07-14 DIAGNOSIS — C44609 Unspecified malignant neoplasm of skin of left upper limb, including shoulder: Secondary | ICD-10-CM | POA: Diagnosis not present

## 2021-07-14 DIAGNOSIS — Z741 Need for assistance with personal care: Secondary | ICD-10-CM | POA: Diagnosis not present

## 2021-07-14 DIAGNOSIS — G40909 Epilepsy, unspecified, not intractable, without status epilepticus: Secondary | ICD-10-CM | POA: Diagnosis not present

## 2021-07-14 DIAGNOSIS — C711 Malignant neoplasm of frontal lobe: Secondary | ICD-10-CM | POA: Diagnosis not present

## 2021-07-14 DIAGNOSIS — R32 Unspecified urinary incontinence: Secondary | ICD-10-CM | POA: Diagnosis not present

## 2021-07-14 DIAGNOSIS — C44309 Unspecified malignant neoplasm of skin of other parts of face: Secondary | ICD-10-CM | POA: Diagnosis not present

## 2021-07-14 DIAGNOSIS — Z7401 Bed confinement status: Secondary | ICD-10-CM | POA: Diagnosis not present

## 2021-07-14 DIAGNOSIS — Z87891 Personal history of nicotine dependence: Secondary | ICD-10-CM | POA: Diagnosis not present

## 2021-07-14 DIAGNOSIS — I10 Essential (primary) hypertension: Secondary | ICD-10-CM | POA: Diagnosis not present

## 2021-07-15 DIAGNOSIS — R159 Full incontinence of feces: Secondary | ICD-10-CM | POA: Diagnosis not present

## 2021-07-15 DIAGNOSIS — G40909 Epilepsy, unspecified, not intractable, without status epilepticus: Secondary | ICD-10-CM | POA: Diagnosis not present

## 2021-07-15 DIAGNOSIS — R42 Dizziness and giddiness: Secondary | ICD-10-CM | POA: Diagnosis not present

## 2021-07-15 DIAGNOSIS — Z87891 Personal history of nicotine dependence: Secondary | ICD-10-CM | POA: Diagnosis not present

## 2021-07-15 DIAGNOSIS — R32 Unspecified urinary incontinence: Secondary | ICD-10-CM | POA: Diagnosis not present

## 2021-07-15 DIAGNOSIS — C44309 Unspecified malignant neoplasm of skin of other parts of face: Secondary | ICD-10-CM | POA: Diagnosis not present

## 2021-07-15 DIAGNOSIS — Z741 Need for assistance with personal care: Secondary | ICD-10-CM | POA: Diagnosis not present

## 2021-07-15 DIAGNOSIS — C44609 Unspecified malignant neoplasm of skin of left upper limb, including shoulder: Secondary | ICD-10-CM | POA: Diagnosis not present

## 2021-07-15 DIAGNOSIS — Z7401 Bed confinement status: Secondary | ICD-10-CM | POA: Diagnosis not present

## 2021-07-15 DIAGNOSIS — C711 Malignant neoplasm of frontal lobe: Secondary | ICD-10-CM | POA: Diagnosis not present

## 2021-07-15 DIAGNOSIS — I10 Essential (primary) hypertension: Secondary | ICD-10-CM | POA: Diagnosis not present

## 2021-07-16 DIAGNOSIS — R159 Full incontinence of feces: Secondary | ICD-10-CM | POA: Diagnosis not present

## 2021-07-16 DIAGNOSIS — R42 Dizziness and giddiness: Secondary | ICD-10-CM | POA: Diagnosis not present

## 2021-07-16 DIAGNOSIS — G40909 Epilepsy, unspecified, not intractable, without status epilepticus: Secondary | ICD-10-CM | POA: Diagnosis not present

## 2021-07-16 DIAGNOSIS — Z741 Need for assistance with personal care: Secondary | ICD-10-CM | POA: Diagnosis not present

## 2021-07-16 DIAGNOSIS — C44309 Unspecified malignant neoplasm of skin of other parts of face: Secondary | ICD-10-CM | POA: Diagnosis not present

## 2021-07-16 DIAGNOSIS — Z87891 Personal history of nicotine dependence: Secondary | ICD-10-CM | POA: Diagnosis not present

## 2021-07-16 DIAGNOSIS — C44609 Unspecified malignant neoplasm of skin of left upper limb, including shoulder: Secondary | ICD-10-CM | POA: Diagnosis not present

## 2021-07-16 DIAGNOSIS — C711 Malignant neoplasm of frontal lobe: Secondary | ICD-10-CM | POA: Diagnosis not present

## 2021-07-16 DIAGNOSIS — R32 Unspecified urinary incontinence: Secondary | ICD-10-CM | POA: Diagnosis not present

## 2021-07-16 DIAGNOSIS — Z7401 Bed confinement status: Secondary | ICD-10-CM | POA: Diagnosis not present

## 2021-07-16 DIAGNOSIS — I10 Essential (primary) hypertension: Secondary | ICD-10-CM | POA: Diagnosis not present

## 2021-07-17 DIAGNOSIS — G40909 Epilepsy, unspecified, not intractable, without status epilepticus: Secondary | ICD-10-CM | POA: Diagnosis not present

## 2021-07-17 DIAGNOSIS — R159 Full incontinence of feces: Secondary | ICD-10-CM | POA: Diagnosis not present

## 2021-07-17 DIAGNOSIS — C711 Malignant neoplasm of frontal lobe: Secondary | ICD-10-CM | POA: Diagnosis not present

## 2021-07-17 DIAGNOSIS — Z741 Need for assistance with personal care: Secondary | ICD-10-CM | POA: Diagnosis not present

## 2021-07-17 DIAGNOSIS — R32 Unspecified urinary incontinence: Secondary | ICD-10-CM | POA: Diagnosis not present

## 2021-07-17 DIAGNOSIS — C44609 Unspecified malignant neoplasm of skin of left upper limb, including shoulder: Secondary | ICD-10-CM | POA: Diagnosis not present

## 2021-07-17 DIAGNOSIS — C44309 Unspecified malignant neoplasm of skin of other parts of face: Secondary | ICD-10-CM | POA: Diagnosis not present

## 2021-07-17 DIAGNOSIS — Z87891 Personal history of nicotine dependence: Secondary | ICD-10-CM | POA: Diagnosis not present

## 2021-07-17 DIAGNOSIS — Z7401 Bed confinement status: Secondary | ICD-10-CM | POA: Diagnosis not present

## 2021-07-17 DIAGNOSIS — R42 Dizziness and giddiness: Secondary | ICD-10-CM | POA: Diagnosis not present

## 2021-07-17 DIAGNOSIS — I10 Essential (primary) hypertension: Secondary | ICD-10-CM | POA: Diagnosis not present

## 2021-07-18 DIAGNOSIS — G40909 Epilepsy, unspecified, not intractable, without status epilepticus: Secondary | ICD-10-CM | POA: Diagnosis not present

## 2021-07-18 DIAGNOSIS — R32 Unspecified urinary incontinence: Secondary | ICD-10-CM | POA: Diagnosis not present

## 2021-07-18 DIAGNOSIS — Z741 Need for assistance with personal care: Secondary | ICD-10-CM | POA: Diagnosis not present

## 2021-07-18 DIAGNOSIS — C44609 Unspecified malignant neoplasm of skin of left upper limb, including shoulder: Secondary | ICD-10-CM | POA: Diagnosis not present

## 2021-07-18 DIAGNOSIS — Z87891 Personal history of nicotine dependence: Secondary | ICD-10-CM | POA: Diagnosis not present

## 2021-07-18 DIAGNOSIS — Z7401 Bed confinement status: Secondary | ICD-10-CM | POA: Diagnosis not present

## 2021-07-18 DIAGNOSIS — C44309 Unspecified malignant neoplasm of skin of other parts of face: Secondary | ICD-10-CM | POA: Diagnosis not present

## 2021-07-18 DIAGNOSIS — R159 Full incontinence of feces: Secondary | ICD-10-CM | POA: Diagnosis not present

## 2021-07-18 DIAGNOSIS — R42 Dizziness and giddiness: Secondary | ICD-10-CM | POA: Diagnosis not present

## 2021-07-18 DIAGNOSIS — I10 Essential (primary) hypertension: Secondary | ICD-10-CM | POA: Diagnosis not present

## 2021-07-18 DIAGNOSIS — C711 Malignant neoplasm of frontal lobe: Secondary | ICD-10-CM | POA: Diagnosis not present

## 2021-07-19 DIAGNOSIS — R159 Full incontinence of feces: Secondary | ICD-10-CM | POA: Diagnosis not present

## 2021-07-19 DIAGNOSIS — Z87891 Personal history of nicotine dependence: Secondary | ICD-10-CM | POA: Diagnosis not present

## 2021-07-19 DIAGNOSIS — C711 Malignant neoplasm of frontal lobe: Secondary | ICD-10-CM | POA: Diagnosis not present

## 2021-07-19 DIAGNOSIS — G40909 Epilepsy, unspecified, not intractable, without status epilepticus: Secondary | ICD-10-CM | POA: Diagnosis not present

## 2021-07-19 DIAGNOSIS — R32 Unspecified urinary incontinence: Secondary | ICD-10-CM | POA: Diagnosis not present

## 2021-07-19 DIAGNOSIS — C44609 Unspecified malignant neoplasm of skin of left upper limb, including shoulder: Secondary | ICD-10-CM | POA: Diagnosis not present

## 2021-07-19 DIAGNOSIS — Z7401 Bed confinement status: Secondary | ICD-10-CM | POA: Diagnosis not present

## 2021-07-19 DIAGNOSIS — C44309 Unspecified malignant neoplasm of skin of other parts of face: Secondary | ICD-10-CM | POA: Diagnosis not present

## 2021-07-19 DIAGNOSIS — Z741 Need for assistance with personal care: Secondary | ICD-10-CM | POA: Diagnosis not present

## 2021-07-19 DIAGNOSIS — R42 Dizziness and giddiness: Secondary | ICD-10-CM | POA: Diagnosis not present

## 2021-07-19 DIAGNOSIS — I10 Essential (primary) hypertension: Secondary | ICD-10-CM | POA: Diagnosis not present

## 2021-07-20 DIAGNOSIS — C44309 Unspecified malignant neoplasm of skin of other parts of face: Secondary | ICD-10-CM | POA: Diagnosis not present

## 2021-07-20 DIAGNOSIS — Z7401 Bed confinement status: Secondary | ICD-10-CM | POA: Diagnosis not present

## 2021-07-20 DIAGNOSIS — Z87891 Personal history of nicotine dependence: Secondary | ICD-10-CM | POA: Diagnosis not present

## 2021-07-20 DIAGNOSIS — G40909 Epilepsy, unspecified, not intractable, without status epilepticus: Secondary | ICD-10-CM | POA: Diagnosis not present

## 2021-07-20 DIAGNOSIS — Z741 Need for assistance with personal care: Secondary | ICD-10-CM | POA: Diagnosis not present

## 2021-07-20 DIAGNOSIS — R32 Unspecified urinary incontinence: Secondary | ICD-10-CM | POA: Diagnosis not present

## 2021-07-20 DIAGNOSIS — I10 Essential (primary) hypertension: Secondary | ICD-10-CM | POA: Diagnosis not present

## 2021-07-20 DIAGNOSIS — R42 Dizziness and giddiness: Secondary | ICD-10-CM | POA: Diagnosis not present

## 2021-07-20 DIAGNOSIS — C711 Malignant neoplasm of frontal lobe: Secondary | ICD-10-CM | POA: Diagnosis not present

## 2021-07-20 DIAGNOSIS — C44609 Unspecified malignant neoplasm of skin of left upper limb, including shoulder: Secondary | ICD-10-CM | POA: Diagnosis not present

## 2021-07-20 DIAGNOSIS — R159 Full incontinence of feces: Secondary | ICD-10-CM | POA: Diagnosis not present

## 2021-07-21 DIAGNOSIS — C44609 Unspecified malignant neoplasm of skin of left upper limb, including shoulder: Secondary | ICD-10-CM | POA: Diagnosis not present

## 2021-07-21 DIAGNOSIS — Z741 Need for assistance with personal care: Secondary | ICD-10-CM | POA: Diagnosis not present

## 2021-07-21 DIAGNOSIS — I10 Essential (primary) hypertension: Secondary | ICD-10-CM | POA: Diagnosis not present

## 2021-07-21 DIAGNOSIS — C44309 Unspecified malignant neoplasm of skin of other parts of face: Secondary | ICD-10-CM | POA: Diagnosis not present

## 2021-07-21 DIAGNOSIS — C711 Malignant neoplasm of frontal lobe: Secondary | ICD-10-CM | POA: Diagnosis not present

## 2021-07-21 DIAGNOSIS — R159 Full incontinence of feces: Secondary | ICD-10-CM | POA: Diagnosis not present

## 2021-07-21 DIAGNOSIS — G40909 Epilepsy, unspecified, not intractable, without status epilepticus: Secondary | ICD-10-CM | POA: Diagnosis not present

## 2021-07-21 DIAGNOSIS — Z7401 Bed confinement status: Secondary | ICD-10-CM | POA: Diagnosis not present

## 2021-07-21 DIAGNOSIS — R32 Unspecified urinary incontinence: Secondary | ICD-10-CM | POA: Diagnosis not present

## 2021-07-21 DIAGNOSIS — R42 Dizziness and giddiness: Secondary | ICD-10-CM | POA: Diagnosis not present

## 2021-07-21 DIAGNOSIS — Z87891 Personal history of nicotine dependence: Secondary | ICD-10-CM | POA: Diagnosis not present

## 2021-07-22 DIAGNOSIS — Z87891 Personal history of nicotine dependence: Secondary | ICD-10-CM | POA: Diagnosis not present

## 2021-07-22 DIAGNOSIS — R159 Full incontinence of feces: Secondary | ICD-10-CM | POA: Diagnosis not present

## 2021-07-22 DIAGNOSIS — I10 Essential (primary) hypertension: Secondary | ICD-10-CM | POA: Diagnosis not present

## 2021-07-22 DIAGNOSIS — Z741 Need for assistance with personal care: Secondary | ICD-10-CM | POA: Diagnosis not present

## 2021-07-22 DIAGNOSIS — R32 Unspecified urinary incontinence: Secondary | ICD-10-CM | POA: Diagnosis not present

## 2021-07-22 DIAGNOSIS — C44309 Unspecified malignant neoplasm of skin of other parts of face: Secondary | ICD-10-CM | POA: Diagnosis not present

## 2021-07-22 DIAGNOSIS — C44609 Unspecified malignant neoplasm of skin of left upper limb, including shoulder: Secondary | ICD-10-CM | POA: Diagnosis not present

## 2021-07-22 DIAGNOSIS — R42 Dizziness and giddiness: Secondary | ICD-10-CM | POA: Diagnosis not present

## 2021-07-22 DIAGNOSIS — G40909 Epilepsy, unspecified, not intractable, without status epilepticus: Secondary | ICD-10-CM | POA: Diagnosis not present

## 2021-07-22 DIAGNOSIS — C711 Malignant neoplasm of frontal lobe: Secondary | ICD-10-CM | POA: Diagnosis not present

## 2021-07-22 DIAGNOSIS — Z7401 Bed confinement status: Secondary | ICD-10-CM | POA: Diagnosis not present

## 2021-07-23 DIAGNOSIS — Z741 Need for assistance with personal care: Secondary | ICD-10-CM | POA: Diagnosis not present

## 2021-07-23 DIAGNOSIS — R159 Full incontinence of feces: Secondary | ICD-10-CM | POA: Diagnosis not present

## 2021-07-23 DIAGNOSIS — Z87891 Personal history of nicotine dependence: Secondary | ICD-10-CM | POA: Diagnosis not present

## 2021-07-23 DIAGNOSIS — R32 Unspecified urinary incontinence: Secondary | ICD-10-CM | POA: Diagnosis not present

## 2021-07-23 DIAGNOSIS — C44309 Unspecified malignant neoplasm of skin of other parts of face: Secondary | ICD-10-CM | POA: Diagnosis not present

## 2021-07-23 DIAGNOSIS — R42 Dizziness and giddiness: Secondary | ICD-10-CM | POA: Diagnosis not present

## 2021-07-23 DIAGNOSIS — C711 Malignant neoplasm of frontal lobe: Secondary | ICD-10-CM | POA: Diagnosis not present

## 2021-07-23 DIAGNOSIS — I10 Essential (primary) hypertension: Secondary | ICD-10-CM | POA: Diagnosis not present

## 2021-07-23 DIAGNOSIS — C44609 Unspecified malignant neoplasm of skin of left upper limb, including shoulder: Secondary | ICD-10-CM | POA: Diagnosis not present

## 2021-07-23 DIAGNOSIS — G40909 Epilepsy, unspecified, not intractable, without status epilepticus: Secondary | ICD-10-CM | POA: Diagnosis not present

## 2021-07-23 DIAGNOSIS — Z7401 Bed confinement status: Secondary | ICD-10-CM | POA: Diagnosis not present

## 2021-07-24 DIAGNOSIS — R42 Dizziness and giddiness: Secondary | ICD-10-CM | POA: Diagnosis not present

## 2021-07-24 DIAGNOSIS — G40909 Epilepsy, unspecified, not intractable, without status epilepticus: Secondary | ICD-10-CM | POA: Diagnosis not present

## 2021-07-24 DIAGNOSIS — R159 Full incontinence of feces: Secondary | ICD-10-CM | POA: Diagnosis not present

## 2021-07-24 DIAGNOSIS — Z741 Need for assistance with personal care: Secondary | ICD-10-CM | POA: Diagnosis not present

## 2021-07-24 DIAGNOSIS — Z7401 Bed confinement status: Secondary | ICD-10-CM | POA: Diagnosis not present

## 2021-07-24 DIAGNOSIS — Z87891 Personal history of nicotine dependence: Secondary | ICD-10-CM | POA: Diagnosis not present

## 2021-07-24 DIAGNOSIS — R32 Unspecified urinary incontinence: Secondary | ICD-10-CM | POA: Diagnosis not present

## 2021-07-24 DIAGNOSIS — C711 Malignant neoplasm of frontal lobe: Secondary | ICD-10-CM | POA: Diagnosis not present

## 2021-07-24 DIAGNOSIS — I10 Essential (primary) hypertension: Secondary | ICD-10-CM | POA: Diagnosis not present

## 2021-07-24 DIAGNOSIS — C44309 Unspecified malignant neoplasm of skin of other parts of face: Secondary | ICD-10-CM | POA: Diagnosis not present

## 2021-07-24 DIAGNOSIS — C44609 Unspecified malignant neoplasm of skin of left upper limb, including shoulder: Secondary | ICD-10-CM | POA: Diagnosis not present

## 2021-07-25 DIAGNOSIS — G40909 Epilepsy, unspecified, not intractable, without status epilepticus: Secondary | ICD-10-CM | POA: Diagnosis not present

## 2021-07-25 DIAGNOSIS — R32 Unspecified urinary incontinence: Secondary | ICD-10-CM | POA: Diagnosis not present

## 2021-07-25 DIAGNOSIS — Z7401 Bed confinement status: Secondary | ICD-10-CM | POA: Diagnosis not present

## 2021-07-25 DIAGNOSIS — C44609 Unspecified malignant neoplasm of skin of left upper limb, including shoulder: Secondary | ICD-10-CM | POA: Diagnosis not present

## 2021-07-25 DIAGNOSIS — R42 Dizziness and giddiness: Secondary | ICD-10-CM | POA: Diagnosis not present

## 2021-07-25 DIAGNOSIS — Z87891 Personal history of nicotine dependence: Secondary | ICD-10-CM | POA: Diagnosis not present

## 2021-07-25 DIAGNOSIS — Z741 Need for assistance with personal care: Secondary | ICD-10-CM | POA: Diagnosis not present

## 2021-07-25 DIAGNOSIS — R159 Full incontinence of feces: Secondary | ICD-10-CM | POA: Diagnosis not present

## 2021-07-25 DIAGNOSIS — C44309 Unspecified malignant neoplasm of skin of other parts of face: Secondary | ICD-10-CM | POA: Diagnosis not present

## 2021-07-25 DIAGNOSIS — C711 Malignant neoplasm of frontal lobe: Secondary | ICD-10-CM | POA: Diagnosis not present

## 2021-07-25 DIAGNOSIS — I10 Essential (primary) hypertension: Secondary | ICD-10-CM | POA: Diagnosis not present

## 2021-07-26 DIAGNOSIS — I10 Essential (primary) hypertension: Secondary | ICD-10-CM | POA: Diagnosis not present

## 2021-07-26 DIAGNOSIS — G40909 Epilepsy, unspecified, not intractable, without status epilepticus: Secondary | ICD-10-CM | POA: Diagnosis not present

## 2021-07-26 DIAGNOSIS — R159 Full incontinence of feces: Secondary | ICD-10-CM | POA: Diagnosis not present

## 2021-07-26 DIAGNOSIS — Z741 Need for assistance with personal care: Secondary | ICD-10-CM | POA: Diagnosis not present

## 2021-07-26 DIAGNOSIS — Z87891 Personal history of nicotine dependence: Secondary | ICD-10-CM | POA: Diagnosis not present

## 2021-07-26 DIAGNOSIS — C711 Malignant neoplasm of frontal lobe: Secondary | ICD-10-CM | POA: Diagnosis not present

## 2021-07-26 DIAGNOSIS — Z7401 Bed confinement status: Secondary | ICD-10-CM | POA: Diagnosis not present

## 2021-07-26 DIAGNOSIS — R32 Unspecified urinary incontinence: Secondary | ICD-10-CM | POA: Diagnosis not present

## 2021-07-26 DIAGNOSIS — C44609 Unspecified malignant neoplasm of skin of left upper limb, including shoulder: Secondary | ICD-10-CM | POA: Diagnosis not present

## 2021-07-26 DIAGNOSIS — C44309 Unspecified malignant neoplasm of skin of other parts of face: Secondary | ICD-10-CM | POA: Diagnosis not present

## 2021-07-26 DIAGNOSIS — R42 Dizziness and giddiness: Secondary | ICD-10-CM | POA: Diagnosis not present

## 2021-07-27 DIAGNOSIS — C711 Malignant neoplasm of frontal lobe: Secondary | ICD-10-CM | POA: Diagnosis not present

## 2021-07-27 DIAGNOSIS — R32 Unspecified urinary incontinence: Secondary | ICD-10-CM | POA: Diagnosis not present

## 2021-07-27 DIAGNOSIS — G40909 Epilepsy, unspecified, not intractable, without status epilepticus: Secondary | ICD-10-CM | POA: Diagnosis not present

## 2021-07-27 DIAGNOSIS — Z87891 Personal history of nicotine dependence: Secondary | ICD-10-CM | POA: Diagnosis not present

## 2021-07-27 DIAGNOSIS — C44309 Unspecified malignant neoplasm of skin of other parts of face: Secondary | ICD-10-CM | POA: Diagnosis not present

## 2021-07-27 DIAGNOSIS — Z7401 Bed confinement status: Secondary | ICD-10-CM | POA: Diagnosis not present

## 2021-07-27 DIAGNOSIS — Z741 Need for assistance with personal care: Secondary | ICD-10-CM | POA: Diagnosis not present

## 2021-07-27 DIAGNOSIS — R42 Dizziness and giddiness: Secondary | ICD-10-CM | POA: Diagnosis not present

## 2021-07-27 DIAGNOSIS — I10 Essential (primary) hypertension: Secondary | ICD-10-CM | POA: Diagnosis not present

## 2021-07-27 DIAGNOSIS — R159 Full incontinence of feces: Secondary | ICD-10-CM | POA: Diagnosis not present

## 2021-07-27 DIAGNOSIS — C44609 Unspecified malignant neoplasm of skin of left upper limb, including shoulder: Secondary | ICD-10-CM | POA: Diagnosis not present

## 2021-07-28 DIAGNOSIS — Z87891 Personal history of nicotine dependence: Secondary | ICD-10-CM | POA: Diagnosis not present

## 2021-07-28 DIAGNOSIS — R42 Dizziness and giddiness: Secondary | ICD-10-CM | POA: Diagnosis not present

## 2021-07-28 DIAGNOSIS — G40909 Epilepsy, unspecified, not intractable, without status epilepticus: Secondary | ICD-10-CM | POA: Diagnosis not present

## 2021-07-28 DIAGNOSIS — Z7401 Bed confinement status: Secondary | ICD-10-CM | POA: Diagnosis not present

## 2021-07-28 DIAGNOSIS — C44309 Unspecified malignant neoplasm of skin of other parts of face: Secondary | ICD-10-CM | POA: Diagnosis not present

## 2021-07-28 DIAGNOSIS — C44609 Unspecified malignant neoplasm of skin of left upper limb, including shoulder: Secondary | ICD-10-CM | POA: Diagnosis not present

## 2021-07-28 DIAGNOSIS — R32 Unspecified urinary incontinence: Secondary | ICD-10-CM | POA: Diagnosis not present

## 2021-07-28 DIAGNOSIS — C711 Malignant neoplasm of frontal lobe: Secondary | ICD-10-CM | POA: Diagnosis not present

## 2021-07-28 DIAGNOSIS — Z741 Need for assistance with personal care: Secondary | ICD-10-CM | POA: Diagnosis not present

## 2021-07-28 DIAGNOSIS — I10 Essential (primary) hypertension: Secondary | ICD-10-CM | POA: Diagnosis not present

## 2021-07-28 DIAGNOSIS — R159 Full incontinence of feces: Secondary | ICD-10-CM | POA: Diagnosis not present

## 2021-07-29 DIAGNOSIS — I10 Essential (primary) hypertension: Secondary | ICD-10-CM | POA: Diagnosis not present

## 2021-07-29 DIAGNOSIS — Z741 Need for assistance with personal care: Secondary | ICD-10-CM | POA: Diagnosis not present

## 2021-07-29 DIAGNOSIS — Z87891 Personal history of nicotine dependence: Secondary | ICD-10-CM | POA: Diagnosis not present

## 2021-07-29 DIAGNOSIS — Z7401 Bed confinement status: Secondary | ICD-10-CM | POA: Diagnosis not present

## 2021-07-29 DIAGNOSIS — R42 Dizziness and giddiness: Secondary | ICD-10-CM | POA: Diagnosis not present

## 2021-07-29 DIAGNOSIS — R159 Full incontinence of feces: Secondary | ICD-10-CM | POA: Diagnosis not present

## 2021-07-29 DIAGNOSIS — C711 Malignant neoplasm of frontal lobe: Secondary | ICD-10-CM | POA: Diagnosis not present

## 2021-07-29 DIAGNOSIS — R32 Unspecified urinary incontinence: Secondary | ICD-10-CM | POA: Diagnosis not present

## 2021-07-29 DIAGNOSIS — C44609 Unspecified malignant neoplasm of skin of left upper limb, including shoulder: Secondary | ICD-10-CM | POA: Diagnosis not present

## 2021-07-29 DIAGNOSIS — G40909 Epilepsy, unspecified, not intractable, without status epilepticus: Secondary | ICD-10-CM | POA: Diagnosis not present

## 2021-07-29 DIAGNOSIS — C44309 Unspecified malignant neoplasm of skin of other parts of face: Secondary | ICD-10-CM | POA: Diagnosis not present

## 2021-07-30 DIAGNOSIS — C711 Malignant neoplasm of frontal lobe: Secondary | ICD-10-CM | POA: Diagnosis not present

## 2021-07-30 DIAGNOSIS — I10 Essential (primary) hypertension: Secondary | ICD-10-CM | POA: Diagnosis not present

## 2021-07-30 DIAGNOSIS — G40909 Epilepsy, unspecified, not intractable, without status epilepticus: Secondary | ICD-10-CM | POA: Diagnosis not present

## 2021-07-30 DIAGNOSIS — C44609 Unspecified malignant neoplasm of skin of left upper limb, including shoulder: Secondary | ICD-10-CM | POA: Diagnosis not present

## 2021-07-30 DIAGNOSIS — C44309 Unspecified malignant neoplasm of skin of other parts of face: Secondary | ICD-10-CM | POA: Diagnosis not present

## 2021-07-30 DIAGNOSIS — R32 Unspecified urinary incontinence: Secondary | ICD-10-CM | POA: Diagnosis not present

## 2021-07-30 DIAGNOSIS — Z7401 Bed confinement status: Secondary | ICD-10-CM | POA: Diagnosis not present

## 2021-07-30 DIAGNOSIS — R159 Full incontinence of feces: Secondary | ICD-10-CM | POA: Diagnosis not present

## 2021-07-30 DIAGNOSIS — Z741 Need for assistance with personal care: Secondary | ICD-10-CM | POA: Diagnosis not present

## 2021-07-30 DIAGNOSIS — Z87891 Personal history of nicotine dependence: Secondary | ICD-10-CM | POA: Diagnosis not present

## 2021-07-30 DIAGNOSIS — R42 Dizziness and giddiness: Secondary | ICD-10-CM | POA: Diagnosis not present

## 2021-07-31 DIAGNOSIS — Z7401 Bed confinement status: Secondary | ICD-10-CM | POA: Diagnosis not present

## 2021-07-31 DIAGNOSIS — G40909 Epilepsy, unspecified, not intractable, without status epilepticus: Secondary | ICD-10-CM | POA: Diagnosis not present

## 2021-07-31 DIAGNOSIS — R32 Unspecified urinary incontinence: Secondary | ICD-10-CM | POA: Diagnosis not present

## 2021-07-31 DIAGNOSIS — Z87891 Personal history of nicotine dependence: Secondary | ICD-10-CM | POA: Diagnosis not present

## 2021-07-31 DIAGNOSIS — R42 Dizziness and giddiness: Secondary | ICD-10-CM | POA: Diagnosis not present

## 2021-07-31 DIAGNOSIS — I10 Essential (primary) hypertension: Secondary | ICD-10-CM | POA: Diagnosis not present

## 2021-07-31 DIAGNOSIS — C44609 Unspecified malignant neoplasm of skin of left upper limb, including shoulder: Secondary | ICD-10-CM | POA: Diagnosis not present

## 2021-07-31 DIAGNOSIS — C711 Malignant neoplasm of frontal lobe: Secondary | ICD-10-CM | POA: Diagnosis not present

## 2021-07-31 DIAGNOSIS — R159 Full incontinence of feces: Secondary | ICD-10-CM | POA: Diagnosis not present

## 2021-07-31 DIAGNOSIS — C44309 Unspecified malignant neoplasm of skin of other parts of face: Secondary | ICD-10-CM | POA: Diagnosis not present

## 2021-07-31 DIAGNOSIS — Z741 Need for assistance with personal care: Secondary | ICD-10-CM | POA: Diagnosis not present

## 2021-08-01 DIAGNOSIS — I10 Essential (primary) hypertension: Secondary | ICD-10-CM | POA: Diagnosis not present

## 2021-08-01 DIAGNOSIS — G40909 Epilepsy, unspecified, not intractable, without status epilepticus: Secondary | ICD-10-CM | POA: Diagnosis not present

## 2021-08-01 DIAGNOSIS — C44309 Unspecified malignant neoplasm of skin of other parts of face: Secondary | ICD-10-CM | POA: Diagnosis not present

## 2021-08-01 DIAGNOSIS — R42 Dizziness and giddiness: Secondary | ICD-10-CM | POA: Diagnosis not present

## 2021-08-01 DIAGNOSIS — Z87891 Personal history of nicotine dependence: Secondary | ICD-10-CM | POA: Diagnosis not present

## 2021-08-01 DIAGNOSIS — C711 Malignant neoplasm of frontal lobe: Secondary | ICD-10-CM | POA: Diagnosis not present

## 2021-08-01 DIAGNOSIS — Z7401 Bed confinement status: Secondary | ICD-10-CM | POA: Diagnosis not present

## 2021-08-01 DIAGNOSIS — Z741 Need for assistance with personal care: Secondary | ICD-10-CM | POA: Diagnosis not present

## 2021-08-01 DIAGNOSIS — R159 Full incontinence of feces: Secondary | ICD-10-CM | POA: Diagnosis not present

## 2021-08-01 DIAGNOSIS — C44609 Unspecified malignant neoplasm of skin of left upper limb, including shoulder: Secondary | ICD-10-CM | POA: Diagnosis not present

## 2021-08-01 DIAGNOSIS — R32 Unspecified urinary incontinence: Secondary | ICD-10-CM | POA: Diagnosis not present

## 2021-08-02 DIAGNOSIS — C44309 Unspecified malignant neoplasm of skin of other parts of face: Secondary | ICD-10-CM | POA: Diagnosis not present

## 2021-08-02 DIAGNOSIS — C44609 Unspecified malignant neoplasm of skin of left upper limb, including shoulder: Secondary | ICD-10-CM | POA: Diagnosis not present

## 2021-08-02 DIAGNOSIS — R32 Unspecified urinary incontinence: Secondary | ICD-10-CM | POA: Diagnosis not present

## 2021-08-02 DIAGNOSIS — Z87891 Personal history of nicotine dependence: Secondary | ICD-10-CM | POA: Diagnosis not present

## 2021-08-02 DIAGNOSIS — G40909 Epilepsy, unspecified, not intractable, without status epilepticus: Secondary | ICD-10-CM | POA: Diagnosis not present

## 2021-08-02 DIAGNOSIS — R159 Full incontinence of feces: Secondary | ICD-10-CM | POA: Diagnosis not present

## 2021-08-02 DIAGNOSIS — I10 Essential (primary) hypertension: Secondary | ICD-10-CM | POA: Diagnosis not present

## 2021-08-02 DIAGNOSIS — Z7401 Bed confinement status: Secondary | ICD-10-CM | POA: Diagnosis not present

## 2021-08-02 DIAGNOSIS — Z741 Need for assistance with personal care: Secondary | ICD-10-CM | POA: Diagnosis not present

## 2021-08-02 DIAGNOSIS — R42 Dizziness and giddiness: Secondary | ICD-10-CM | POA: Diagnosis not present

## 2021-08-02 DIAGNOSIS — C711 Malignant neoplasm of frontal lobe: Secondary | ICD-10-CM | POA: Diagnosis not present

## 2021-08-03 DIAGNOSIS — C44609 Unspecified malignant neoplasm of skin of left upper limb, including shoulder: Secondary | ICD-10-CM | POA: Diagnosis not present

## 2021-08-03 DIAGNOSIS — R42 Dizziness and giddiness: Secondary | ICD-10-CM | POA: Diagnosis not present

## 2021-08-03 DIAGNOSIS — G40909 Epilepsy, unspecified, not intractable, without status epilepticus: Secondary | ICD-10-CM | POA: Diagnosis not present

## 2021-08-03 DIAGNOSIS — Z741 Need for assistance with personal care: Secondary | ICD-10-CM | POA: Diagnosis not present

## 2021-08-03 DIAGNOSIS — Z87891 Personal history of nicotine dependence: Secondary | ICD-10-CM | POA: Diagnosis not present

## 2021-08-03 DIAGNOSIS — R32 Unspecified urinary incontinence: Secondary | ICD-10-CM | POA: Diagnosis not present

## 2021-08-03 DIAGNOSIS — Z7401 Bed confinement status: Secondary | ICD-10-CM | POA: Diagnosis not present

## 2021-08-03 DIAGNOSIS — C44309 Unspecified malignant neoplasm of skin of other parts of face: Secondary | ICD-10-CM | POA: Diagnosis not present

## 2021-08-03 DIAGNOSIS — C711 Malignant neoplasm of frontal lobe: Secondary | ICD-10-CM | POA: Diagnosis not present

## 2021-08-03 DIAGNOSIS — I10 Essential (primary) hypertension: Secondary | ICD-10-CM | POA: Diagnosis not present

## 2021-08-03 DIAGNOSIS — R159 Full incontinence of feces: Secondary | ICD-10-CM | POA: Diagnosis not present

## 2021-08-04 DIAGNOSIS — R159 Full incontinence of feces: Secondary | ICD-10-CM | POA: Diagnosis not present

## 2021-08-04 DIAGNOSIS — Z741 Need for assistance with personal care: Secondary | ICD-10-CM | POA: Diagnosis not present

## 2021-08-04 DIAGNOSIS — R32 Unspecified urinary incontinence: Secondary | ICD-10-CM | POA: Diagnosis not present

## 2021-08-04 DIAGNOSIS — Z87891 Personal history of nicotine dependence: Secondary | ICD-10-CM | POA: Diagnosis not present

## 2021-08-04 DIAGNOSIS — Z7401 Bed confinement status: Secondary | ICD-10-CM | POA: Diagnosis not present

## 2021-08-04 DIAGNOSIS — R42 Dizziness and giddiness: Secondary | ICD-10-CM | POA: Diagnosis not present

## 2021-08-04 DIAGNOSIS — C711 Malignant neoplasm of frontal lobe: Secondary | ICD-10-CM | POA: Diagnosis not present

## 2021-08-04 DIAGNOSIS — C44609 Unspecified malignant neoplasm of skin of left upper limb, including shoulder: Secondary | ICD-10-CM | POA: Diagnosis not present

## 2021-08-04 DIAGNOSIS — G40909 Epilepsy, unspecified, not intractable, without status epilepticus: Secondary | ICD-10-CM | POA: Diagnosis not present

## 2021-08-04 DIAGNOSIS — C44309 Unspecified malignant neoplasm of skin of other parts of face: Secondary | ICD-10-CM | POA: Diagnosis not present

## 2021-08-04 DIAGNOSIS — I10 Essential (primary) hypertension: Secondary | ICD-10-CM | POA: Diagnosis not present

## 2021-08-05 DIAGNOSIS — R42 Dizziness and giddiness: Secondary | ICD-10-CM | POA: Diagnosis not present

## 2021-08-05 DIAGNOSIS — R159 Full incontinence of feces: Secondary | ICD-10-CM | POA: Diagnosis not present

## 2021-08-05 DIAGNOSIS — Z87891 Personal history of nicotine dependence: Secondary | ICD-10-CM | POA: Diagnosis not present

## 2021-08-05 DIAGNOSIS — Z7401 Bed confinement status: Secondary | ICD-10-CM | POA: Diagnosis not present

## 2021-08-05 DIAGNOSIS — C44609 Unspecified malignant neoplasm of skin of left upper limb, including shoulder: Secondary | ICD-10-CM | POA: Diagnosis not present

## 2021-08-05 DIAGNOSIS — C44309 Unspecified malignant neoplasm of skin of other parts of face: Secondary | ICD-10-CM | POA: Diagnosis not present

## 2021-08-05 DIAGNOSIS — C711 Malignant neoplasm of frontal lobe: Secondary | ICD-10-CM | POA: Diagnosis not present

## 2021-08-05 DIAGNOSIS — R32 Unspecified urinary incontinence: Secondary | ICD-10-CM | POA: Diagnosis not present

## 2021-08-05 DIAGNOSIS — Z741 Need for assistance with personal care: Secondary | ICD-10-CM | POA: Diagnosis not present

## 2021-08-05 DIAGNOSIS — G40909 Epilepsy, unspecified, not intractable, without status epilepticus: Secondary | ICD-10-CM | POA: Diagnosis not present

## 2021-08-05 DIAGNOSIS — I10 Essential (primary) hypertension: Secondary | ICD-10-CM | POA: Diagnosis not present

## 2021-08-06 ENCOUNTER — Telehealth: Payer: Self-pay

## 2021-08-06 DIAGNOSIS — I10 Essential (primary) hypertension: Secondary | ICD-10-CM | POA: Diagnosis not present

## 2021-08-06 DIAGNOSIS — Z87891 Personal history of nicotine dependence: Secondary | ICD-10-CM | POA: Diagnosis not present

## 2021-08-06 DIAGNOSIS — R159 Full incontinence of feces: Secondary | ICD-10-CM | POA: Diagnosis not present

## 2021-08-06 DIAGNOSIS — Z7401 Bed confinement status: Secondary | ICD-10-CM | POA: Diagnosis not present

## 2021-08-06 DIAGNOSIS — C711 Malignant neoplasm of frontal lobe: Secondary | ICD-10-CM | POA: Diagnosis not present

## 2021-08-06 DIAGNOSIS — R42 Dizziness and giddiness: Secondary | ICD-10-CM | POA: Diagnosis not present

## 2021-08-06 DIAGNOSIS — R32 Unspecified urinary incontinence: Secondary | ICD-10-CM | POA: Diagnosis not present

## 2021-08-06 DIAGNOSIS — Z741 Need for assistance with personal care: Secondary | ICD-10-CM | POA: Diagnosis not present

## 2021-08-06 DIAGNOSIS — C44609 Unspecified malignant neoplasm of skin of left upper limb, including shoulder: Secondary | ICD-10-CM | POA: Diagnosis not present

## 2021-08-06 DIAGNOSIS — C44309 Unspecified malignant neoplasm of skin of other parts of face: Secondary | ICD-10-CM | POA: Diagnosis not present

## 2021-08-06 DIAGNOSIS — G40909 Epilepsy, unspecified, not intractable, without status epilepticus: Secondary | ICD-10-CM | POA: Diagnosis not present

## 2021-08-07 NOTE — Telephone Encounter (Signed)
Derek Mound called to inform Dr. Mickeal Skinner and Maudie Mercury, RN of Latresa's passing. He wanted to thank you both personally for everything you have done for him and [place]. Will route this message to Dr. Mickeal Skinner and Maudie Mercury.

## 2021-08-07 DEATH — deceased

## 2023-11-16 NOTE — Telephone Encounter (Signed)
 err
